# Patient Record
Sex: Male | Born: 1955 | Hispanic: Yes | Marital: Married | Smoking: Former smoker
Health system: Southern US, Community
[De-identification: ages and names within clinical notes are randomized; demographics above are authoritative.]

## PROBLEM LIST (undated history)

## (undated) DIAGNOSIS — K219 Gastro-esophageal reflux disease without esophagitis: Secondary | ICD-10-CM

## (undated) DIAGNOSIS — M1711 Unilateral primary osteoarthritis, right knee: Secondary | ICD-10-CM

## (undated) DIAGNOSIS — I5020 Unspecified systolic (congestive) heart failure: Secondary | ICD-10-CM

## (undated) DIAGNOSIS — I639 Cerebral infarction, unspecified: Secondary | ICD-10-CM

## (undated) DIAGNOSIS — I10 Essential (primary) hypertension: Secondary | ICD-10-CM

## (undated) DIAGNOSIS — I509 Heart failure, unspecified: Secondary | ICD-10-CM

## (undated) HISTORY — DX: Heart failure, unspecified: I50.9

## (undated) HISTORY — DX: Unspecified systolic (congestive) heart failure: I50.20

## (undated) HISTORY — PX: INNER EAR SURGERY: SHX679

---
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
## 2019-06-18 ENCOUNTER — Encounter: Payer: Self-pay | Admitting: Emergency Medicine

## 2019-06-18 ENCOUNTER — Emergency Department: Payer: Self-pay

## 2019-06-18 ENCOUNTER — Other Ambulatory Visit: Payer: Self-pay

## 2019-06-18 ENCOUNTER — Inpatient Hospital Stay
Admission: EM | Admit: 2019-06-18 | Discharge: 2019-06-23 | DRG: 064 | Disposition: A | Discharge status: Home / Self Care | Payer: Self-pay | Attending: Hospitalist | Admitting: Hospitalist

## 2019-06-18 ENCOUNTER — Observation Stay: Payer: Self-pay

## 2019-06-18 DIAGNOSIS — I110 Hypertensive heart disease with heart failure: Secondary | ICD-10-CM | POA: Diagnosis present

## 2019-06-18 DIAGNOSIS — M542 Cervicalgia: Secondary | ICD-10-CM | POA: Diagnosis present

## 2019-06-18 DIAGNOSIS — M170 Bilateral primary osteoarthritis of knee: Secondary | ICD-10-CM | POA: Diagnosis present

## 2019-06-18 DIAGNOSIS — Z9114 Patient's other noncompliance with medication regimen: Secondary | ICD-10-CM

## 2019-06-18 DIAGNOSIS — Z720 Tobacco use: Secondary | ICD-10-CM | POA: Diagnosis present

## 2019-06-18 DIAGNOSIS — I10 Essential (primary) hypertension: Secondary | ICD-10-CM | POA: Diagnosis present

## 2019-06-18 DIAGNOSIS — I639 Cerebral infarction, unspecified: Secondary | ICD-10-CM | POA: Diagnosis present

## 2019-06-18 DIAGNOSIS — I6381 Other cerebral infarction due to occlusion or stenosis of small artery: Principal | ICD-10-CM | POA: Diagnosis present

## 2019-06-18 DIAGNOSIS — R531 Weakness: Secondary | ICD-10-CM

## 2019-06-18 DIAGNOSIS — I739 Peripheral vascular disease, unspecified: Secondary | ICD-10-CM | POA: Diagnosis present

## 2019-06-18 DIAGNOSIS — I6509 Occlusion and stenosis of unspecified vertebral artery: Secondary | ICD-10-CM | POA: Diagnosis present

## 2019-06-18 DIAGNOSIS — I428 Other cardiomyopathies: Secondary | ICD-10-CM

## 2019-06-18 DIAGNOSIS — Z8249 Family history of ischemic heart disease and other diseases of the circulatory system: Secondary | ICD-10-CM

## 2019-06-18 DIAGNOSIS — F17290 Nicotine dependence, other tobacco product, uncomplicated: Secondary | ICD-10-CM | POA: Diagnosis present

## 2019-06-18 DIAGNOSIS — Z20822 Contact with and (suspected) exposure to covid-19: Secondary | ICD-10-CM | POA: Diagnosis present

## 2019-06-18 DIAGNOSIS — I5020 Unspecified systolic (congestive) heart failure: Secondary | ICD-10-CM

## 2019-06-18 DIAGNOSIS — M25511 Pain in right shoulder: Secondary | ICD-10-CM | POA: Diagnosis present

## 2019-06-18 DIAGNOSIS — G8194 Hemiplegia, unspecified affecting left nondominant side: Secondary | ICD-10-CM | POA: Diagnosis present

## 2019-06-18 DIAGNOSIS — R29707 NIHSS score 7: Secondary | ICD-10-CM | POA: Diagnosis present

## 2019-06-18 DIAGNOSIS — E876 Hypokalemia: Secondary | ICD-10-CM | POA: Diagnosis present

## 2019-06-18 DIAGNOSIS — R402362 Coma scale, best motor response, obeys commands, at arrival to emergency department: Secondary | ICD-10-CM | POA: Diagnosis present

## 2019-06-18 DIAGNOSIS — I5021 Acute systolic (congestive) heart failure: Secondary | ICD-10-CM | POA: Diagnosis present

## 2019-06-18 DIAGNOSIS — E785 Hyperlipidemia, unspecified: Secondary | ICD-10-CM | POA: Diagnosis present

## 2019-06-18 DIAGNOSIS — I160 Hypertensive urgency: Secondary | ICD-10-CM | POA: Diagnosis present

## 2019-06-18 HISTORY — DX: Essential (primary) hypertension: I10

## 2019-06-18 LAB — COMPREHENSIVE METABOLIC PANEL
ALT: 12 U/L (ref 0–44)
AST: 16 U/L (ref 15–41)
Albumin: 4.1 g/dL (ref 3.5–5.0)
Alkaline Phosphatase: 75 U/L (ref 38–126)
Anion gap: 10 (ref 5–15)
BUN: 19 mg/dL (ref 8–23)
CO2: 27 mmol/L (ref 22–32)
Calcium: 9.5 mg/dL (ref 8.9–10.3)
Chloride: 105 mmol/L (ref 98–111)
Creatinine, Ser: 1.05 mg/dL (ref 0.61–1.24)
GFR calc Af Amer: >60 mL/min (ref >60)
GFR calc non Af Amer: >60 mL/min (ref >60)
Glucose, Bld: 106 mg/dL — ABNORMAL HIGH (ref 70–99)
Potassium: 2.9 mmol/L — ABNORMAL LOW (ref 3.5–5.1)
Sodium: 142 mmol/L (ref 135–145)
Total Bilirubin: 0.9 mg/dL (ref 0.3–1.2)
Total Protein: 7.4 g/dL (ref 6.5–8.1)

## 2019-06-18 LAB — DIFFERENTIAL
Abs Immature Granulocytes: 0.02 10*3/uL (ref 0.00–0.07)
Basophils Absolute: 0.1 10*3/uL (ref 0.0–0.1)
Basophils Relative: 1 %
Eosinophils Absolute: 0.2 10*3/uL (ref 0.0–0.5)
Eosinophils Relative: 2 %
Immature Granulocytes: 0 %
Lymphocytes Relative: 18 %
Lymphs Abs: 1.2 10*3/uL (ref 0.7–4.0)
Monocytes Absolute: 0.3 10*3/uL (ref 0.1–1.0)
Monocytes Relative: 4 %
Neutro Abs: 5 10*3/uL (ref 1.7–7.7)
Neutrophils Relative %: 75 %

## 2019-06-18 LAB — CBC
HCT: 48.2 % (ref 39.0–52.0)
Hemoglobin: 15.7 g/dL (ref 13.0–17.0)
MCH: 28 pg (ref 26.0–34.0)
MCHC: 32.6 g/dL (ref 30.0–36.0)
MCV: 86.1 fL (ref 80.0–100.0)
Platelets: 209 10*3/uL (ref 150–400)
RBC: 5.6 MIL/uL (ref 4.22–5.81)
RDW: 13.3 % (ref 11.5–15.5)
WBC: 6.7 10*3/uL (ref 4.0–10.5)
nRBC: 0 % (ref 0.0–0.2)

## 2019-06-18 LAB — RESPIRATORY PANEL BY RT PCR (FLU A&B, COVID)
Influenza A by PCR: NEGATIVE
Influenza B by PCR: NEGATIVE
SARS Coronavirus 2 by RT PCR: NEGATIVE

## 2019-06-18 LAB — HIV ANTIBODY (ROUTINE TESTING W REFLEX): HIV Screen 4th Generation wRfx: NONREACTIVE

## 2019-06-18 LAB — PROTIME-INR
INR: 0.9 (ref 0.8–1.2)
Prothrombin Time: 12 seconds (ref 11.4–15.2)

## 2019-06-18 LAB — GLUCOSE, CAPILLARY
Glucose-Capillary: 99 mg/dL (ref 70–99)
Glucose-Capillary: 98 mg/dL (ref 70–99)

## 2019-06-18 LAB — APTT: aPTT: 30 seconds (ref 24–36)

## 2019-06-18 MED ORDER — ASPIRIN 300 MG RE SUPP
300.0000 mg | Freq: Every day | RECTAL | Status: DC
  Filled 2019-06-18 (×5): qty 1

## 2019-06-18 MED ORDER — POTASSIUM CHLORIDE 10 MEQ/100ML IV SOLN
10.0000 meq | INTRAVENOUS | Status: AC
  Administered 2019-06-18 (×2): 10 meq via INTRAVENOUS
  Filled 2019-06-18 (×2): qty 100

## 2019-06-18 MED ORDER — OXYCODONE-ACETAMINOPHEN 5-325 MG PO TABS
1.0000 | ORAL_TABLET | ORAL | Status: DC | PRN
  Administered 2019-06-18 – 2019-06-21 (×7): 1 via ORAL
  Filled 2019-06-18 (×7): qty 1

## 2019-06-18 MED ORDER — ACETAMINOPHEN 160 MG/5ML PO SOLN
650.0000 mg | ORAL | Status: DC | PRN
  Filled 2019-06-18: qty 20.3

## 2019-06-18 MED ORDER — POTASSIUM CHLORIDE CRYS ER 20 MEQ PO TBCR
40.0000 meq | EXTENDED_RELEASE_TABLET | ORAL | Status: AC
  Administered 2019-06-18 (×2): 40 meq via ORAL
  Filled 2019-06-18 (×2): qty 2

## 2019-06-18 MED ORDER — MORPHINE SULFATE (PF) 4 MG/ML IV SOLN
4.0000 mg | Freq: Once | INTRAVENOUS | Status: AC
  Administered 2019-06-18: 4 mg via INTRAVENOUS
  Filled 2019-06-18: qty 1

## 2019-06-18 MED ORDER — HYDRALAZINE HCL 20 MG/ML IJ SOLN
5.0000 mg | INTRAMUSCULAR | Status: DC | PRN
  Administered 2019-06-18: 5 mg via INTRAVENOUS
  Filled 2019-06-18 (×2): qty 0.25

## 2019-06-18 MED ORDER — ONDANSETRON HCL 4 MG/2ML IJ SOLN
4.0000 mg | Freq: Once | INTRAMUSCULAR | Status: AC
  Administered 2019-06-18: 4 mg via INTRAVENOUS
  Filled 2019-06-18: qty 2

## 2019-06-18 MED ORDER — ATORVASTATIN CALCIUM 20 MG PO TABS
40.0000 mg | ORAL_TABLET | Freq: Every day | ORAL | Status: DC
  Administered 2019-06-18: 40 mg via ORAL
  Filled 2019-06-18: qty 2

## 2019-06-18 MED ORDER — HYDRALAZINE HCL 20 MG/ML IJ SOLN: 5.0000 mg | INTRAMUSCULAR | Status: DC | PRN

## 2019-06-18 MED ORDER — ACETAMINOPHEN 650 MG RE SUPP: 650.0000 mg | RECTAL | Status: DC | PRN

## 2019-06-18 MED ORDER — SENNOSIDES-DOCUSATE SODIUM 8.6-50 MG PO TABS: 1.0000 | ORAL_TABLET | Freq: Every evening | ORAL | Status: DC | PRN

## 2019-06-18 MED ORDER — STROKE: EARLY STAGES OF RECOVERY BOOK: Freq: Once | Status: DC

## 2019-06-18 MED ORDER — SODIUM CHLORIDE 0.9% FLUSH: 3.0000 mL | Freq: Once | INTRAVENOUS | Status: DC

## 2019-06-18 MED ORDER — ACETAMINOPHEN 325 MG PO TABS: 650.0000 mg | ORAL_TABLET | ORAL | Status: DC | PRN

## 2019-06-18 MED ORDER — ASPIRIN EC 325 MG PO TBEC
325.0000 mg | DELAYED_RELEASE_TABLET | Freq: Every day | ORAL | Status: DC
  Administered 2019-06-18 – 2019-06-23 (×6): 325 mg via ORAL
  Filled 2019-06-18 (×6): qty 1

## 2019-06-18 MED ORDER — SODIUM CHLORIDE 0.9 % IV SOLN: INTRAVENOUS | Status: DC

## 2019-06-18 MED ORDER — NICOTINE 21 MG/24HR TD PT24
21.0000 mg | MEDICATED_PATCH | Freq: Every day | TRANSDERMAL | Status: DC
  Filled 2019-06-18 (×3): qty 1

## 2019-06-18 MED ORDER — ENOXAPARIN SODIUM 40 MG/0.4ML ~~LOC~~ SOLN
40.0000 mg | SUBCUTANEOUS | Status: DC
  Administered 2019-06-18 – 2019-06-22 (×5): 40 mg via SUBCUTANEOUS
  Filled 2019-06-18 (×6): qty 0.4

## 2019-06-18 NOTE — ED Triage Notes (Signed)
Per video interpretor, pt explains around 0200 or 0300 am this morning, left leg and left arm were weak when getting out of bed, causing pt to fall. When he went to bed last night, both arm and leg were fine-no weakness, pt has never had this before, speech clear per sister, no facial droop. Positive left arm drift, unable to hold it up at all. NAD.

## 2019-06-18 NOTE — Consult Note (Signed)
Reason for Consult: L sided weakness  Requesting Physician: Dr. Kerman Passey   CC: L sided weakness   HPI: Juan Hughes is an 64 y.o. male with hx of HNT non on any medications. Patient last known well around 10-11PM last night. Woke up this AM at 3 with L sided deficits. NIHSS of 3 for LUE, LLE drift and subjective numbness on L side. Speech at baseline.    Past Medical History:  Diagnosis Date  . Hypertension     History reviewed. No pertinent surgical history.  No family history on file.  Social History:  reports that he has been smoking cigars. He has never used smokeless tobacco. He reports that he does not drink alcohol. No history on file for drug.  No Known Allergies  Medications: I have reviewed the patient's current medications.  ROS: Unable to obtain due to language barrier   Physical Examination: Blood pressure (!) 205/117, pulse 69, temperature 98 F (36.7 C), temperature source Oral, resp. rate 15, height 5' (1.524 m), weight 68 kg, SpO2 99 %.   Mental Status: Alert, oriented, thought content appropriate.  Speech fluent without evidence of aphasia.  Able to follow 3 step commands without difficulty. Cranial Nerves: II: Discs flat bilaterally; Visual fields grossly normal, pupils equal, round, reactive to light and accommodation III,IV, VI: ptosis not present, extra-ocular motions intact bilaterally V,VII: smile symmetric, facial light touch sensation normal bilaterally VIII: hearing normal bilaterally XI: bilateral shoulder shrug XII: midline tongue extension Motor: Right : Upper extremity   5/5    Left:     Upper extremity   4-/5  Lower extremity   5/5     Lower extremity   4-/5 Tone and bulk:normal tone throughout; no atrophy noted Sensory: Pinprick and light touch subjective decrease L side Deep Tendon Reflexes: 1+ and symmetric throughout Plantars: Right: downgoing   Left: downgoing Cerebellar: normal finger-to-nose, Gait: not tested        Laboratory Studies:   Basic Metabolic Panel: Recent Labs  Lab 06/18/19 1129  NA 142  K 2.9*  CL 105  CO2 27  GLUCOSE 106*  BUN 19  CREATININE 1.05  CALCIUM 9.5    Liver Function Tests: Recent Labs  Lab 06/18/19 1129  AST 16  ALT 12  ALKPHOS 75  BILITOT 0.9  PROT 7.4  ALBUMIN 4.1   No results for input(s): LIPASE, AMYLASE in the last 168 hours. No results for input(s): AMMONIA in the last 168 hours.  CBC: Recent Labs  Lab 06/18/19 1129  WBC 6.7  NEUTROABS 5.0  HGB 15.7  HCT 48.2  MCV 86.1  PLT 209    Cardiac Enzymes: No results for input(s): CKTOTAL, CKMB, CKMBINDEX, TROPONINI in the last 168 hours.  BNP: Invalid input(s): POCBNP  CBG: Recent Labs  Lab 06/18/19 1129 06/18/19 1202  GLUCAP 99 98    Microbiology: No results found for this or any previous visit.  Coagulation Studies: Recent Labs    06/18/19 1129  LABPROT 12.0  INR 0.9    Urinalysis: No results for input(s): COLORURINE, LABSPEC, PHURINE, GLUCOSEU, HGBUR, BILIRUBINUR, KETONESUR, PROTEINUR, UROBILINOGEN, NITRITE, LEUKOCYTESUR in the last 168 hours.  Invalid input(s): APPERANCEUR  Lipid Panel:  No results found for: CHOL, TRIG, HDL, CHOLHDL, VLDL, LDLCALC  HgbA1C: No results found for: HGBA1C  Urine Drug Screen:  No results found for: LABOPIA, COCAINSCRNUR, LABBENZ, AMPHETMU, THCU, LABBARB  Alcohol Level: No results for input(s): ETH in the last 168 hours.  Other results: EKG: normal EKG,  normal sinus rhythm, unchanged from previous tracings.  Imaging: CT HEAD WO CONTRAST  Result Date: 06/18/2019 CLINICAL DATA:  Left leg and arm weakness.  Subsequent fall EXAM: CT HEAD WITHOUT CONTRAST TECHNIQUE: Contiguous axial images were obtained from the base of the skull through the vertex without intravenous contrast. COMPARISON:  None. FINDINGS: Brain: Chronic small vessel ischemic gliosis in the cerebral white matter, fairly extensive. No evidence of acute infarct,  hemorrhage, hydrocephalus, or mass. Vascular: Vertebrobasilar tortuosity.  No hyperdense vessel. Skull: Negative Sinuses/Orbits: Negative IMPRESSION: 1. No acute finding. 2. Extensive chronic small vessel ischemia. Electronically Signed   By: Marnee Spring M.D.   On: 06/18/2019 11:44     Assessment/Plan:   64 y.o. male with hx of HNT non on any medications. Patient last known well around 10-11PM last night. Woke up this AM at 3 with L sided deficits. NIHSS of 3 for LUE, LLE drift and subjective numbness on L side. Speech at baseline.    - Likely stroke in setting of small vessel disease as SBP >200 on admission - Permissive HTN overnight close to 180-200 systolic if possiblke - ASA 325 now - no TPA as outside window - no stat CTA as low NIHSS as HTN so suspect stroke due to small vessel disase - admission  - MRI/MRA - pt/ot  - speech -2decho 06/18/2019, 12:35 PM

## 2019-06-18 NOTE — H&P (Signed)
History and Physical    Osborne Serio VVZ:[id number] DOB: Oct 10, 1955 DOA: 06/18/2019  Referring MD/NP/PA:   PCP: Patient, No Pcp Per   Patient coming from:  The patient is coming from home.  At baseline, pt is independent for most of ADL.        Chief Complaint: Left-sided weakness  HPI: Cloy Cozzens is a 64 y.o. male with medical history significant of hypertension, tobacco abuse, who presented with left-sided weakness.  For his son who is living with patient (I called his son by phone), patient was last known normal at last night, and developed left-sided weakness in left arm and leg at about 2-3 AM. Pt does not have slurred speech, facial droop.  No chest pain, shortness breath, cough, fever or chills.  No nausea, vomiting, diarrhea, abdominal pain, symptoms of UTI.  Patient did not take blood pressure medications for long time per his son.  Pt also has posterior neck pain, no injury or fall.  ED Course: pt was found to have WBC 6.7, INR 0.9, PTT 30, Pending RVP Covid test, potassium 2.9, creatinine 1.05, BUN 19, temperature normal, blood pressure 205/117, heart rate 69, RR 15 oxygen saturation 99% on room air.  CT of head is negative for acute intracranial abnormalities the patient is placed on MedSurg bed for observation.  Neurology, Dr. Loretha Brasil was consulted.  Review of Systems:   General: no fevers, chills, no body weight gain, has fatigue HEENT: no blurry vision, hearing changes or sore throat Respiratory: no dyspnea, coughing, wheezing CV: no chest pain, no palpitations GI: no nausea, vomiting, abdominal pain, diarrhea, constipation GU: no dysuria, burning on urination, increased urinary frequency, hematuria  Ext: no leg edema Neuro: has left sided weakness, no vision change or hearing loss Skin: no rash, no skin tear. MSK: No muscle spasm, no deformity, no limitation of range of movement in spin Heme: No easy bruising.  Travel history: No recent long  distant travel.  Allergy: No Known Allergies  Past Medical History:  Diagnosis Date  . Hypertension     History reviewed. No pertinent surgical history.  Social History:  reports that he has been smoking cigars. He has never used smokeless tobacco. He reports that he does not drink alcohol or use drugs.  Family History:  Family History  Problem Relation Age of Onset  . Hypertension Mother      Prior to Admission medications   Not on File    Physical Exam: Vitals:   06/18/19 1230 06/18/19 1300 06/18/19 1330 06/18/19 1400  BP: (!) 206/120 (!) 196/115 (!) 202/109 (!) 197/104  Pulse: 70 66 63 65  Resp: 18 14 11 15   Temp:      TempSrc:      SpO2: 98% 97% 98% 98%  Weight:      Height:       General: Not in acute distress HEENT:       Eyes: PERRL, EOMI, no scleral icterus.       ENT: No discharge from the ears and nose, no pharynx injection, no tonsillar enlargement.        Neck: No JVD, no bruit, no mass felt. Heme: No neck lymph node enlargement. Cardiac: S1/S2, RRR, No murmurs, No gallops or rubs. Respiratory:  No rales, wheezing, rhonchi or rubs. GI: Soft, nondistended, nontender, no rebound pain, no organomegaly, BS present. GU: No hematuria Ext: No pitting leg edema bilaterally. 2+DP/PT pulse bilaterally. Musculoskeletal: No joint deformities, No joint redness or warmth, no limitation  of ROM in spin. Skin: No rashes.  Neuro: Alert, oriented X3, cranial nerves II-XII grossly intact, moves all extremities normally. Muscle strength 4/5 in left arm and 3/5 in left leg, 5/5 in right extremities, sensation to light touch intact. Psych: Patient is not psychotic, no suicidal or hemocidal ideation.  Labs on Admission: I have personally reviewed following labs and imaging studies  CBC: Recent Labs  Lab 06/18/19 1129  WBC 6.7  NEUTROABS 5.0  HGB 15.7  HCT 48.2  MCV 86.1  PLT 209   Basic Metabolic Panel: Recent Labs  Lab 06/18/19 1129  NA 142  K 2.9*  CL 105    CO2 27  GLUCOSE 106*  BUN 19  CREATININE 1.05  CALCIUM 9.5   GFR: Estimated Creatinine Clearance: 59.8 mL/min (by C-G formula based on SCr of 1.05 mg/dL). Liver Function Tests: Recent Labs  Lab 06/18/19 1129  AST 16  ALT 12  ALKPHOS 75  BILITOT 0.9  PROT 7.4  ALBUMIN 4.1   No results for input(s): LIPASE, AMYLASE in the last 168 hours. No results for input(s): AMMONIA in the last 168 hours. Coagulation Profile: Recent Labs  Lab 06/18/19 1129  INR 0.9   Cardiac Enzymes: No results for input(s): CKTOTAL, CKMB, CKMBINDEX, TROPONINI in the last 168 hours. BNP (last 3 results) No results for input(s): PROBNP in the last 8760 hours. HbA1C: No results for input(s): HGBA1C in the last 72 hours. CBG: Recent Labs  Lab 06/18/19 1129 06/18/19 1202  GLUCAP 99 98   Lipid Profile: No results for input(s): CHOL, HDL, LDLCALC, TRIG, CHOLHDL, LDLDIRECT in the last 72 hours. Thyroid Function Tests: No results for input(s): TSH, T4TOTAL, FREET4, T3FREE, THYROIDAB in the last 72 hours. Anemia Panel: No results for input(s): VITAMINB12, FOLATE, FERRITIN, TIBC, IRON, RETICCTPCT in the last 72 hours. Urine analysis: No results found for: COLORURINE, APPEARANCEUR, LABSPEC, PHURINE, GLUCOSEU, HGBUR, BILIRUBINUR, KETONESUR, PROTEINUR, UROBILINOGEN, NITRITE, LEUKOCYTESUR Sepsis Labs: @LABRCNTIP (procalcitonin:4,lacticidven:4) ) Recent Results (from the past 240 hour(s))  Respiratory Panel by RT PCR (Flu A&B, Covid) - Nasopharyngeal Swab     Status: None   Collection Time: 06/18/19 12:18 PM   Specimen: Nasopharyngeal Swab  Result Value Ref Range Status   SARS Coronavirus 2 by RT PCR NEGATIVE NEGATIVE Final    Comment: (NOTE) SARS-CoV-2 target nucleic acids are NOT DETECTED. The SARS-CoV-2 RNA is generally detectable in upper respiratoy specimens during the acute phase of infection. The lowest concentration of SARS-CoV-2 viral copies this assay can detect is 131 copies/mL. A negative  result does not preclude SARS-Cov-2 infection and should not be used as the sole basis for treatment or other patient management decisions. A negative result may occur with  improper specimen collection/handling, submission of specimen other than nasopharyngeal swab, presence of viral mutation(s) within the areas targeted by this assay, and inadequate number of viral copies (<131 copies/mL). A negative result must be combined with clinical observations, patient history, and epidemiological information. The expected result is Negative. Fact Sheet for Patients:  06/20/19 Fact Sheet for Healthcare Providers:  https://www.moore.com/ This test is not yet ap proved or cleared by the https://www.young.biz/ FDA and  has been authorized for detection and/or diagnosis of SARS-CoV-2 by FDA under an Emergency Use Authorization (EUA). This EUA will remain  in effect (meaning this test can be used) for the duration of the COVID-19 declaration under Section 564(b)(1) of the Act, 21 U.S.C. section 360bbb-3(b)(1), unless the authorization is terminated or revoked sooner.    Influenza A  by PCR NEGATIVE NEGATIVE Final   Influenza B by PCR NEGATIVE NEGATIVE Final    Comment: (NOTE) The Xpert Xpress SARS-CoV-2/FLU/RSV assay is intended as an aid in  the diagnosis of influenza from Nasopharyngeal swab specimens and  should not be used as a sole basis for treatment. Nasal washings and  aspirates are unacceptable for Xpert Xpress SARS-CoV-2/FLU/RSV  testing. Fact Sheet for Patients: PinkCheek.be Fact Sheet for Healthcare Providers: GravelBags.it This test is not yet approved or cleared by the [place] FDA and  has been authorized for detection and/or diagnosis of SARS-CoV-2 by  FDA under an Emergency Use Authorization (EUA). This EUA will remain  in effect (meaning this test can be used) for the  duration of the  Covid-19 declaration under Section 564(b)(1) of the Act, 21  U.S.C. section 360bbb-3(b)(1), unless the authorization is  terminated or revoked. Performed at [facility], [address] [place], [place] [address]      Radiological Exams on Admission: CT HEAD WO CONTRAST  Result Date: 06/18/2019 CLINICAL DATA:  Left leg and arm weakness.  Subsequent fall EXAM: CT HEAD WITHOUT CONTRAST TECHNIQUE: Contiguous axial images were obtained from the base of the skull through the vertex without intravenous contrast. COMPARISON:  None. FINDINGS: Brain: Chronic small vessel ischemic gliosis in the cerebral white matter, fairly extensive. No evidence of acute infarct, hemorrhage, hydrocephalus, or mass. Vascular: Vertebrobasilar tortuosity.  No hyperdense vessel. Skull: Negative Sinuses/Orbits: Negative IMPRESSION: 1. No acute finding. 2. Extensive chronic small vessel ischemia. Electronically Signed   By: Monte Fantasia M.D.   On: 06/18/2019 11:44     EKG: Independently reviewed.  Sinus rhythm, QTC 529, LAE, LVH.  Assessment/Plan Principal Problem:   Left-sided weakness Active Problems:   Hypertension   Hypokalemia   Tobacco abuse   Stroke ([place])   Left-sided weakness due to possible stroke: CT-head negative for acute intracranial abnormalities.  Neurology, Dr. Irish Elders was consulted, who recommended stroke work-up.  - will place on tele bed for obs - Highly appreciated neurologist's consultation,will follow up recommendations - Obtain MRI/MRA  - will hold oral Bp meds to allow permissive HTN in the setting of acute stroke  - Check carotid dopplers  - start ASA and lipitor - fasting lipid panel and HbA1c  - 2D transthoracic echocardiography  - swallowing screen. If fails, will get SLP - PT/OT consult  Hypertension: -start IV hydralazine as needed for SBP>200 or dBP>180, allow permissive hypertension  Hypokalemia: K= 2.9 on admission. - Repleted - Check  Mg level  Tobacco abuse: -Did counseling about importance of quitting smoking -Nicotine patch    DVT ppx: SQ Lovenox Code Status: Full code Family Communication: Yes, patient's sister at bed side and son by phone Disposition Plan:  Anticipate discharge back to previous home environment Consults called:  Dr. Irish Elders of neuro Admission status: Med-surg bed for obs   Date of Service 06/18/2019    [place] [facility]   If 7PM-7AM, please contact night-coverage [email or web address] Password [email or web address] 06/18/2019, 2:53 PM

## 2019-06-18 NOTE — Progress Notes (Signed)
PT Cancellation Note  Patient Details Name: Juan Hughes MRN: [id number] DOB: Jan 03, 1956   Cancelled Treatment:    Reason Eval/Treat Not Completed: Medical issues which prohibited therapy(Chart reviewed, RN consulted. Aware of permissive HTN, however I do not see an emergent need to perform OOB exertion at this time c BP in 190s/100s. MRI pending. Will await updates lytes to assure K+ in safe range. Will continue to follow.)  3:18 PM, 06/18/19 Rosamaria Lints, PT, DPT Physical Therapist - [facility]  [phone] (ASCOM)   [place] 06/18/2019, 3:18 PM

## 2019-06-18 NOTE — ED Provider Notes (Signed)
[facility] Emergency Department Provider Note  Time seen: 11:53 AM  I have reviewed the triage vital signs and the nursing notes.   HISTORY  Chief Complaint Stroke symptoms   HPI Juan Hughes is a 64 y.o. male with a past medical history of hypertension currently not on prescription medications presents to the emergency department for left-sided weakness.  According to the patient he went to bed feeling normal last night with full mobility.  Patient states around 3:00 this morning he awoke to use the restroom and could not adequately move his left arm or leg and fell to the ground.  States he went back to bed but when he awoke later this morning he continued to have symptoms so he came to the emergency department for evaluation.  Patient denies any trouble thinking or speaking.  Denies any sensory deficits.  No history of CVA previously.  Past Medical History:  Diagnosis Date  . Hypertension     There are no problems to display for this patient.   History reviewed. No pertinent surgical history.  Prior to Admission medications   Not on File    No Known Allergies  No family history on file.  Social History Social History   Tobacco Use  . Smoking status: Current Every Day Smoker    Types: Cigars  . Smokeless tobacco: Never Used  Substance Use Topics  . Alcohol use: Never  . Drug use: Not on file    Review of Systems Constitutional: Negative for fever. Cardiovascular: Negative for chest pain. Respiratory: Negative for shortness of breath. Gastrointestinal: Negative for abdominal pain, vomiting  Musculoskeletal: Negative for musculoskeletal complaints Neurological: Negative for headache All other ROS negative  ____________________________________________   PHYSICAL EXAM:  VITAL SIGNS: ED Triage Vitals  Enc Vitals Group     BP 06/18/19 1110 (!) 192/102     Pulse Rate 06/18/19 1112 76     Resp 06/18/19 1112 18     Temp  06/18/19 1112 98 F (36.7 C)     Temp Source 06/18/19 1112 Oral     SpO2 06/18/19 1112 97 %     Weight 06/18/19 1111 150 lb (68 kg)     Height 06/18/19 1111 5' (1.524 m)     Head Circumference --      Peak Flow --      Pain Score --      Pain Loc --      Pain Edu? --      Excl. in [place]? --     Constitutional: Alert and oriented. Well appearing and in no distress. Eyes: Normal exam ENT      Head: Normocephalic and atraumatic.      Mouth/Throat: Mucous membranes are moist. Cardiovascular: Normal rate, regular rhythm Respiratory: Normal respiratory effort without tachypnea nor retractions. Breath sounds are clear Gastrointestinal: Soft and nontender. No distention.  Musculoskeletal: Nontender with normal range of motion in all extremities. Neurologic:  Normal speech and language.  Significant left upper extremity and left lower extremity weakness.  Sensation intact and equal.  No cranial nerve deficits. Skin:  Skin is warm, dry and intact.  Psychiatric: Mood and affect are normal.   ____________________________________________    EKG  EKG viewed and interpreted by myself shows a normal sinus rhythm at 70 bpm with a narrow QRS, normal axis, largely normal intervals besides slight QTC prolongation, nonspecific ST changes.  ____________________________________________    RADIOLOGY  CT head negative  ____________________________________________   INITIAL IMPRESSION /  ASSESSMENT AND PLAN / ED COURSE  Pertinent labs & imaging results that were available during my care of the patient were reviewed by me and considered in my medical decision making (see chart for details).   Patient presents to the emergency department for left-sided weakness.  Patient's exam is consistent with likely CVA.  CT is negative.  We will consult neurology, obtain MRI and admit to the hospitalist service for further work-up and treatment.  Patient agreeable to plan of care.  Outside of TPA window.  Seen  by neurology no concern for LVO per neurology.  We will admit to medicine for workup.    NIH Stroke Scale   Interval: Baseline Time: 12:00 PM Person Administering Scale: Minna Antis  Administer stroke scale items in the order listed. Record performance in each category after each subscale exam. Do not go back and change scores. Follow directions provided for each exam technique. Scores should reflect what the patient does, not what the clinician thinks the patient can do. The clinician should record answers while administering the exam and work quickly. Except where indicated, the patient should not be coached (i.e., repeated requests to patient to make a special effort).   1a  Level of consciousness: 0=alert; keenly responsive  1b. LOC questions:  0=Performs both tasks correctly  1c. LOC commands: 0=Performs both tasks correctly  2.  Best Gaze: 0=normal  3.  Visual: 0=No visual loss  4. Facial Palsy: 0=Normal symmetric movement  5a.  Motor left arm: 2=Some effort against gravity, limb cannot get to or maintain (if cured) 90 (or 45) degrees, drifts down to bed, but has some effort against gravity  5b.  Motor right arm: 0=No drift, limb holds 90 (or 45) degrees for full 10 seconds  6a. motor left leg: 2=Some effort against gravity, limb cannot get to or maintain (if cured) 90 (or 45) degrees, drifts down to bed, but has some effort against gravity  6b  Motor right leg:  0=No drift, limb holds 90 (or 45) degrees for full 10 seconds  7. Limb Ataxia: 2=Present in two limbs  8.  Sensory: 0=Normal; no sensory loss  9. Best Language:  0=No aphasia, normal  10. Dysarthria: 0=Normal  11. Extinction and Inattention: 0=No abnormality  12. Distal motor function: 1=At least some extension after 5 seconds, but is not fully extended. Any movement of the fingers which is not in response to a command is not scored   Total:   7    [name] was evaluated in Emergency Department on  06/18/2019 for the symptoms described in the history of present illness. He was evaluated in the context of the global COVID-19 pandemic, which necessitated consideration that the patient might be at risk for infection with the SARS-CoV-2 virus that causes COVID-19. Institutional protocols and algorithms that pertain to the evaluation of patients at risk for COVID-19 are in a state of rapid change based on information released by regulatory bodies including the CDC and federal and state organizations. These policies and algorithms were followed during the patient's care in the ED.  ____________________________________________   FINAL CLINICAL IMPRESSION(S) / ED DIAGNOSES  CVA   Minna Antis, MD 06/18/19 1222

## 2019-06-18 NOTE — ED Notes (Signed)
Pt c/o neck pain 8/10. Dr. Lenard Lance notified. Morphine ordered

## 2019-06-18 NOTE — ED Notes (Signed)
Interpreter at bedside to discuss plan of care

## 2019-06-18 NOTE — ED Notes (Signed)
Interpreter at bedside to update family on plan of care

## 2019-06-18 NOTE — Progress Notes (Signed)
OT Cancellation Note  Patient Details Name: Juan Hughes MRN: [id number] DOB: December 21, 1955   Cancelled Treatment:    Reason Eval/Treat Not Completed: Medical issues which prohibited therapy  OT consult received and chart reviewed. Upon chart review, pt noted to have low K+ at 2.9, BP elevated (SBP 191-206), and MRI still pending. Will f/u for OT evaluation next available date as pt becomes appropriate. Thank you.  Rejeana Brock, MS, OTR/L ascom [phone] 06/18/19, 3:57 PM

## 2019-06-18 NOTE — ED Notes (Signed)
ED Provider at bedside. 

## 2019-06-19 ENCOUNTER — Observation Stay
Admit: 2019-06-19 | Discharge: 2019-06-19 | Disposition: A | Discharge status: Home / Self Care | Payer: Self-pay | Attending: Internal Medicine | Admitting: Internal Medicine

## 2019-06-19 LAB — ECHOCARDIOGRAM COMPLETE
Height: 60 in
Weight: 2400 oz

## 2019-06-19 LAB — LIPID PANEL
Cholesterol: 210 mg/dL — ABNORMAL HIGH (ref 0–200)
HDL: 51 mg/dL (ref >40)
LDL Cholesterol: 136 mg/dL — ABNORMAL HIGH (ref 0–99)
Total CHOL/HDL Ratio: 4.1 RATIO
Triglycerides: 113 mg/dL (ref <150)
VLDL: 23 mg/dL (ref 0–40)

## 2019-06-19 LAB — MAGNESIUM: Magnesium: 1.8 mg/dL (ref 1.7–2.4)

## 2019-06-19 LAB — HEMOGLOBIN A1C
Hgb A1c MFr Bld: 5.4 % (ref 4.8–5.6)
Mean Plasma Glucose: 108.28 mg/dL

## 2019-06-19 MED ORDER — ADULT MULTIVITAMIN W/MINERALS CH
1.0000 | ORAL_TABLET | Freq: Every day | ORAL | Status: DC
  Administered 2019-06-20 – 2019-06-23 (×4): 1 via ORAL
  Filled 2019-06-19 (×4): qty 1

## 2019-06-19 MED ORDER — AMLODIPINE BESYLATE 5 MG PO TABS
5.0000 mg | ORAL_TABLET | Freq: Every day | ORAL | Status: DC
  Administered 2019-06-19: 5 mg via ORAL
  Filled 2019-06-19: qty 1

## 2019-06-19 MED ORDER — ATORVASTATIN CALCIUM 20 MG PO TABS
80.0000 mg | ORAL_TABLET | Freq: Every day | ORAL | Status: DC
  Administered 2019-06-19 – 2019-06-22 (×4): 80 mg via ORAL
  Filled 2019-06-19 (×4): qty 4

## 2019-06-19 MED ORDER — LABETALOL HCL 5 MG/ML IV SOLN
10.0000 mg | Freq: Four times a day (QID) | INTRAVENOUS | Status: DC | PRN
  Administered 2019-06-19: 10 mg via INTRAVENOUS
  Filled 2019-06-19: qty 4

## 2019-06-19 MED ORDER — ENSURE ENLIVE PO LIQD
237.0000 mL | Freq: Two times a day (BID) | ORAL | Status: DC
  Administered 2019-06-19 – 2019-06-23 (×4): 237 mL via ORAL

## 2019-06-19 MED ORDER — LOSARTAN POTASSIUM 25 MG PO TABS
25.0000 mg | ORAL_TABLET | Freq: Every day | ORAL | Status: DC
  Administered 2019-06-19: 25 mg via ORAL
  Filled 2019-06-19: qty 1

## 2019-06-19 NOTE — TOC Progression Note (Signed)
Transition of Care [facility]) - Progression Note    Patient Details  Name: Juan Hughes MRN: [id number] Date of Birth: 1955-06-15  Transition of Care [facility]) CM/SW Contact  Barrie Dunker, RN Phone Number: 06/19/2019, 2:21 PM  Clinical Narrative:     Sherron Monday with Kaitlyn from CIR and they will review the patient for possible admission once they get the PT and OT notes.  I am waiting to speak to the family to see what is available to them With the patient not having Insurance finding a SNF bed would be very difficult and would require an LOG as well as the patient applying for Medicaid and then a facility willing to accept the LOG.  Awaiting to hear back from CIR as this will be the recommendation from PT and OT, and then will speak with the family with what is available.       Expected Discharge Plan and Services                                                 Social Determinants of Health (SDOH) Interventions    Readmission Risk Interventions No flowsheet data found.

## 2019-06-19 NOTE — Progress Notes (Signed)
Initial Nutrition Assessment  DOCUMENTATION CODES:   Not applicable  INTERVENTION:  Ensure Enlive po BID, each supplement provides 350 kcal and 20 grams of protein (chocolate)  MVI with minerals daily  NUTRITION DIAGNOSIS:   Inadequate oral intake related to acute illness, decreased appetite(R CR stroke) as evidenced by per patient/family report(energy intake <75% 2-3 days PTA, meal completion <50%).  GOAL:   Patient will meet greater than or equal to 90% of their needs    MONITOR:   PO intake, Supplement acceptance, Labs, I & O's  REASON FOR ASSESSMENT:   Malnutrition Screening Tool    ASSESSMENT:  RD working remotely.  64 year old male with past medical history of HTN and tobacco abuse who presented with left-sided weakness admitted with suspected stroke due to small vessel disease.  Per chart review, patient is not on medications for HTN. He woke up the morning of 1/28 around 3 AM with L sided deficits. CT of head negative for acute intracranial abnormalities and patient admitted for observation and neurology consulted for evaluation.   Per neuro note, likely stroke of small vessel as SBP >200 on admission.  MRI brain with R CR stroke and small vessel disease.   Patient on heart healthy diet, no recorded meals at this time. Spoke with pt via phone with the assistance of Byrd Hesselbach from [employer] (ID [phone]). Patient reports his appetite has not been so good over the past couple of days. He recalls eating little of cereal with milk, toast, and coffee this morning for breakfast and some of his fish with broccoli, and tea for lunch.  He endorses good home po, reports 2 meals/day, usually just lunch and dinner and recalls chx with vegetables, chicken mole with rice, and lots of fish. Patient drinks coffee in the morning, does not eat breakfast on a regular basis. Patient amenable to cold chocolate flavored supplements during admission. RD will provide Ensure twice daily  to aid with calorie and protein needs and encouraged po intake of meals.   Patient denies any unintentional weight loss and recall UBW 150-155 lbs. Current wt 149.6 lbs No wt history for review  Medications reviewed. Labs: K 2.9 (L)  NUTRITION - FOCUSED PHYSICAL EXAM: Unable to complete at this time, RD working remotely.   Diet Order:   Diet Order            Diet Heart Room service appropriate? Yes; Fluid consistency: Thin  Diet effective now              EDUCATION NEEDS:   No education needs have been identified at this time  Skin:  Skin Assessment: Reviewed RN Assessment  Last BM:  1/27  Height:   Ht Readings from Last 1 Encounters:  06/18/19 5' (1.524 m)    Weight:   Wt Readings from Last 1 Encounters:  06/18/19 68 kg    Ideal Body Weight:  48.2 kg  BMI:  Body mass index is 29.29 kg/m.  Estimated Nutritional Needs:   Kcal:  1700-1900  Protein:  85-95  Fluid:  </= 1.7 L/day   Lars Masson, RD, LDN Clinical Nutrition [employer] Telephone [phone] After Hours/Weekend Pager: [phone]

## 2019-06-19 NOTE — TOC Progression Note (Signed)
Transition of Care [facility]) - Progression Note    Patient Details  Name: Juan Hughes MRN: [id number] Date of Birth: 1956-05-02  Transition of Care [facility]) CM/SW Contact  Barrie Dunker, RN Phone Number: 06/19/2019, 4:13 PM  Clinical Narrative:     CIR will evaluate on Monday and states that he looks like a good candidate       Expected Discharge Plan and Services                                                 Social Determinants of Health (SDOH) Interventions    Readmission Risk Interventions No flowsheet data found.

## 2019-06-19 NOTE — Plan of Care (Signed)
Pt is still weak on the left side. Once during shift pt had some sensory deficits on his left side. No other signs of distress noted. Will continue to monitor.

## 2019-06-19 NOTE — Evaluation (Signed)
Occupational Therapy Evaluation Patient Details Name: Juan Hughes MRN: [id number] DOB: 11/21/1955 Today's Date: 06/19/2019    History of Present Illness Pt is 64 yo male PMH of HTN presented to ED 1/28 for L sided weakness. MRI+ for small acute lacunar infarct in the posterior right coronaradiata. No associated hemorrhage or mass effect. 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 Spanish interpreter Claretha Cooper) utilized. Prior to hospital admission, pt was mod independent ambulating with SPC, indep with ADL and IADL, and working in painting/construction. Pt lives with his spouse and adult son in a one-level home with 5-7 steps to enter and bilateral hand rails. Currently pt demonstrates significant impairments in LUE/LLE strength and coordination which are compromising pt's balance and ability to perform mobility and ADL at baseline independence. Pt requires min-mod assist x2 for transfer training with decreased WBing through LLE on floor and LUE through RW. Significant R knee pain also impeding his ability to maintain standing for more than a few minutes at a time. Mod A x2 and 2 person handheld assist to ambulate short shuffling steps from EOB to recliner. Pt instructed in LUE ex and encouraged maximal LUE involvement in bilat and ADL tasks to support return of function. Pt also educated on safe positioning of LUE in order to promote functional return, improve safety, and promote skin integrity on this date. Pt verbalized understanding. Pt motivated to return to prior independence and return to work. Good family support at home. Sister present for session this date. No cognitive, sensory, or visual deficits noted with assessment on this date. Pt would benefit from skilled OT to address noted impairments and functional limitations (see  below for any additional details) in order to maximize safety and independence while minimizing falls risk and caregiver burden.  Upon hospital discharge, recommend pt discharge to CIR for high-intensity skilled acute rehab services to maximize safety and return to PLOF.    Follow Up Recommendations  CIR    Equipment Recommendations  3 in 1 bedside commode    Recommendations for Other Services Rehab consult     Precautions / Restrictions Precautions Precautions: Fall Restrictions Weight Bearing Restrictions: No      Mobility Bed Mobility               General bed mobility comments: deferred, pt seated EOB upon entry  Transfers Overall transfer level: Needs assistance Equipment used: Rolling walker (2 wheeled);2 person hand held assist Transfers: Sit to/from Stand Sit to Stand: Mod assist;Min assist;+2 physical assistance         General transfer comment: Mod A x2 for STS trial with RW (difficulty maintaining LUE grip on RW), Min A x2 for standing balance    Balance Overall balance assessment: Needs assistance Sitting-balance support: Single extremity supported;Feet supported Sitting balance-Leahy Scale: Fair       Standing balance-Leahy Scale: Poor Standing balance comment: required BUE support                           ADL either performed or assessed with clinical judgement   ADL Overall ADL's : Needs assistance/impaired Eating/Feeding: Modified independent Eating/Feeding Details (indicate cue type and reason): increased effort/difficulty with bilat tasks requiring LUE involvement Grooming: Sitting;Set up   Upper Body Bathing: Sitting;Minimal assistance   Lower Body Bathing: Sitting/lateral leans;Moderate assistance;Minimal  assistance   Upper Body Dressing : Sitting;Minimal assistance   Lower Body Dressing: Sitting/lateral leans;Moderate assistance;Minimal assistance   Toilet Transfer: Ambulation;+2 for physical assistance;Moderate  assistance;BSC           Functional mobility during ADLs: Moderate assistance;+2 for physical assistance       Vision Baseline Vision/History: Wears glasses Wears Glasses: At all times(bifocals) Patient Visual Report: No change from baseline Vision Assessment?: No apparent visual deficits     Perception     Praxis      Pertinent Vitals/Pain Pain Assessment: 0-10 Pain Score: 7  Pain Location: R knee (chronic) Pain Descriptors / Indicators: Aching;Grimacing;Guarding Pain Intervention(s): Limited activity within patient's tolerance;Monitored during session;Repositioned;Patient requesting pain meds-RN notified     Hand Dominance Right   Extremity/Trunk Assessment Upper Extremity Assessment Upper Extremity Assessment: LUE deficits/detail(RUE WFL) LUE Deficits / Details: grossly 3-/5 shoulder flexion, elbow flex/ext 4-/5, grip 4/5, decreased finger to nose, thumb opposition testing; pt denies sensory deficits LUE Coordination: decreased fine motor;decreased gross motor   Lower Extremity Assessment Lower Extremity Assessment: LLE deficits/detail;Defer to PT evaluation(RLE grossly WFL, limited by R knee pain) LLE Deficits / Details: grossly at least 4/5 except DF/PF weaker, denies sensory deficits, impaired heel to shin LLE Coordination: decreased fine motor   Cervical / Trunk Assessment Cervical / Trunk Assessment: Normal   Communication Communication Communication: Prefers language other than English;Interpreter utilized(Spanish)   Cognition Arousal/Alertness: Awake/alert Behavior During Therapy: WFL for tasks assessed/performed Overall Cognitive Status: Within Functional Limits for tasks assessed                                     General Comments       Exercises Other Exercises Other Exercises: Pt instructed in functional transfer training with OT/PT, during attempts with RW, therapist facilitated mod-max manual cues to LUE to maintain grip on RW  and support proprioceptive input through elbow and wrist joints Other Exercises: Pt/sister instructed in LUE [facility] and maximizing functional use of LUE during and outside therapy sessions to support maximal optimal return   Shoulder Instructions      Home Living Family/patient expects to be discharged to:: Private residence Living Arrangements: Spouse/significant other;Children(son) Available Help at Discharge: Family;Available 24 hours/day Type of Home: House Home Access: Stairs to enter [employer] of Steps: 5-7 Entrance Stairs-Rails: Can reach both;Right;Left Home Layout: One level     Bathroom Shower/Tub: Chief Strategy Officer: Standard     Home Equipment: Cane - single point          Prior Functioning/Environment Level of Independence: Independent with assistive device(s)        Comments: Ambulating with SPC at baseline, indep with ADL, 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ical engineer; only 1 fall on Thursday prior to hospitalization        OT Problem List: Decreased strength;Decreased coordination;Decreased activity tolerance;Impaired balance (sitting and/or standing);Decreased knowledge of use of DME or AE;Impaired UE functional use      OT Treatment/Interventions: Self-care/ADL training;Therapeutic exercise;Therapeutic activities;Neuromuscular education;DME and/or AE instruction;Patient/family education;Balance training    OT Goals(Current goals can be found in the care plan section) Acute Rehab OT Goals Patient Stated Goal: to get better and return to work OT Goal Formulation: With patient/family Time For Goal Achievement: 07/03/19 Potential to Achieve Goals: Good ADL Goals Pt Will Perform Upper Body Dressing: sitting;with modified independence Pt Will Perform Lower Body Dressing: sit to/from stand;with min  guard assist Pt Will Transfer to Toilet: with min guard assist;ambulating(LRAD for amb) Additional ADL Goal #1: Pt will be independent with  [facility] Ex program for LUE to improve functional use for ADL and IADL  OT Frequency: Min 3X/week   Barriers to D/C:            Co-evaluation PT/OT/SLP Co-Evaluation/Treatment: Yes Reason for Co-Treatment: For patient/therapist safety;To address functional/ADL transfers PT goals addressed during session: Mobility/safety with mobility;Balance;Proper use of DME OT goals addressed during session: ADL's and self-care;Proper use of Adaptive equipment and DME      AM-PAC OT "6 Clicks" Daily Activity     Outcome Measure Help from another person eating meals?: None Help from another person taking care of personal grooming?: A Little Help from another person toileting, which includes using toliet, bedpan, or urinal?: A Lot Help from another person bathing (including washing, rinsing, drying)?: A Lot Help from another person to put on and taking off regular upper body clothing?: A Little Help from another person to put on and taking off regular lower body clothing?: A Lot 6 Click Score: 16   End of Session Equipment Utilized During Treatment: Gait belt;Rolling walker Nurse Communication: Mobility status  Activity Tolerance: Patient tolerated treatment well Patient left: in chair;with call bell/phone within reach;with chair alarm set;with family/visitor present  OT Visit Diagnosis: Other abnormalities of gait and mobility (R26.89);Hemiplegia and hemiparesis Hemiplegia - Right/Left: Left Hemiplegia - dominant/non-dominant: Non-Dominant Hemiplegia - caused by: Cerebral infarction                Time: 5038-8828 OT Time Calculation (min): 25 min Charges:  OT General Charges $OT Visit: 1 Visit OT Evaluation $OT Eval Moderate Complexity: 1 Mod  Jeni Salles, MPH, MS, OTR/L ascom [phone] 06/19/19, 2:30 PM

## 2019-06-19 NOTE — Progress Notes (Signed)
*  PRELIMINARY RESULTS* Echocardiogram 2D Echocardiogram has been performed.  Juan Hughes 06/19/2019, 9:29 AM

## 2019-06-19 NOTE — Progress Notes (Signed)
Rehab Admissions Coordinator Note:  Per OT recommendation, patient was screened by Stephania Fragmin for appropriateness for an Inpatient Acute Rehab Consult.  At this time, we are recommending Inpatient Rehab consult.  I will place an order so we can evaluate the patient.  Note, with no SLP needs, pt will need PT evaluation in order to proceed with CIR assessment.   Stephania Fragmin 06/19/2019, 3:32 PM  I can be reached at [phone].

## 2019-06-19 NOTE — Consult Note (Signed)
Subjective: Still persistent L sided weakness   Past Medical History:  Diagnosis Date  . Hypertension     History reviewed. No pertinent surgical history.  Family History  Problem Relation Age of Onset  . Hypertension Mother     Social History:  reports that he has been smoking cigars. He has never used smokeless tobacco. He reports that he does not drink alcohol or use drugs.  No Known Allergies   Physical Examination: Blood pressure (!) 182/101, pulse 71, temperature 98.4 F (36.9 C), temperature source Oral, resp. rate 18, height 5' (1.524 m), weight 68 kg, SpO2 98 %.   Mental Status: Alert, oriented, thought content appropriate.  Speech fluent without evidence of aphasia.  Able to follow 3 step commands without difficulty. Cranial Nerves: II: Discs flat bilaterally; Visual fields grossly normal, pupils equal, round, reactive to light and accommodation III,IV, VI: ptosis not present, extra-ocular motions intact bilaterally V,VII: smile symmetric, facial light touch sensation normal bilaterally VIII: hearing normal bilaterally XI: bilateral shoulder shrug XII: midline tongue extension Motor: Right : Upper extremity   5/5    Left:     Upper extremity   4-/5  Lower extremity   5/5     Lower extremity   4-/5 Tone and bulk:normal tone throughout; no atrophy noted Sensory: Pinprick and light touch subjective decrease L side Deep Tendon Reflexes: 1+ and symmetric throughout Plantars: Right: downgoing   Left: downgoing Cerebellar: normal finger-to-nose, Gait: not tested       Laboratory Studies:   Basic Metabolic Panel: Recent Labs  Lab 06/18/19 1129  NA 142  K 2.9*  CL 105  CO2 27  GLUCOSE 106*  BUN 19  CREATININE 1.05  CALCIUM 9.5    Liver Function Tests: Recent Labs  Lab 06/18/19 1129  AST 16  ALT 12  ALKPHOS 75  BILITOT 0.9  PROT 7.4  ALBUMIN 4.1   No results for input(s): LIPASE, AMYLASE in the last 168 hours. No results for input(s): AMMONIA  in the last 168 hours.  CBC: Recent Labs  Lab 06/18/19 1129  WBC 6.7  NEUTROABS 5.0  HGB 15.7  HCT 48.2  MCV 86.1  PLT 209    Cardiac Enzymes: No results for input(s): CKTOTAL, CKMB, CKMBINDEX, TROPONINI in the last 168 hours.  BNP: Invalid input(s): POCBNP  CBG: Recent Labs  Lab 06/18/19 1129 06/18/19 1202  GLUCAP 99 98    Microbiology: Results for orders placed or performed during the hospital encounter of 06/18/19  Respiratory Panel by RT PCR (Flu A&B, Covid) - Nasopharyngeal Swab     Status: None   Collection Time: 06/18/19 12:18 PM   Specimen: Nasopharyngeal Swab  Result Value Ref Range Status   SARS Coronavirus 2 by RT PCR NEGATIVE NEGATIVE Final    Comment: (NOTE) SARS-CoV-2 target nucleic acids are NOT DETECTED. The SARS-CoV-2 RNA is generally detectable in upper respiratoy specimens during the acute phase of infection. The lowest concentration of SARS-CoV-2 viral copies this assay can detect is 131 copies/mL. A negative result does not preclude SARS-Cov-2 infection and should not be used as the sole basis for treatment or other patient management decisions. A negative result may occur with  improper specimen collection/handling, submission of specimen other than nasopharyngeal swab, presence of viral mutation(s) within the areas targeted by this assay, and inadequate number of viral copies (<131 copies/mL). A negative result must be combined with clinical observations, patient history, and epidemiological information. The expected result is Negative. Fact Sheet for Patients:  https://www.moore.com/ Fact Sheet for Healthcare Providers:  https://www.young.biz/ This test is not yet ap proved or cleared by the [place] FDA and  has been authorized for detection and/or diagnosis of SARS-CoV-2 by FDA under an Emergency Use Authorization (EUA). This EUA will remain  in effect (meaning this test can be used) for the  duration of the COVID-19 declaration under Section 564(b)(1) of the Act, 21 U.S.C. section 360bbb-3(b)(1), unless the authorization is terminated or revoked sooner.    Influenza A by PCR NEGATIVE NEGATIVE Final   Influenza B by PCR NEGATIVE NEGATIVE Final    Comment: (NOTE) The Xpert Xpress SARS-CoV-2/FLU/RSV assay is intended as an aid in  the diagnosis of influenza from Nasopharyngeal swab specimens and  should not be used as a sole basis for treatment. Nasal washings and  aspirates are unacceptable for Xpert Xpress SARS-CoV-2/FLU/RSV  testing. Fact Sheet for Patients: https://www.moore.com/ Fact Sheet for Healthcare Providers: https://www.young.biz/ This test is not yet approved or cleared by the [place] FDA and  has been authorized for detection and/or diagnosis of SARS-CoV-2 by  FDA under an Emergency Use Authorization (EUA). This EUA will remain  in effect (meaning this test can be used) for the duration of the  Covid-19 declaration under Section 564(b)(1) of the Act, 21  U.S.C. section 360bbb-3(b)(1), unless the authorization is  terminated or revoked. Performed at [facility], [address] [place], [place] [address]     Coagulation Studies: Recent Labs    06/18/19 1129  LABPROT 12.0  INR 0.9    Urinalysis: No results for input(s): COLORURINE, LABSPEC, PHURINE, GLUCOSEU, HGBUR, BILIRUBINUR, KETONESUR, PROTEINUR, UROBILINOGEN, NITRITE, LEUKOCYTESUR in the last 168 hours.  Invalid input(s): APPERANCEUR  Lipid Panel:     Component Value Date/Time   CHOL 210 (H) 06/19/2019 0422   TRIG 113 06/19/2019 0422   HDL 51 06/19/2019 0422   CHOLHDL 4.1 06/19/2019 0422   VLDL 23 06/19/2019 0422   LDLCALC 136 (H) 06/19/2019 0422    HgbA1C: No results found for: HGBA1C  Urine Drug Screen:  No results found for: LABOPIA, COCAINSCRNUR, LABBENZ, AMPHETMU, THCU, LABBARB  Alcohol Level: No results for input(s): ETH in  the last 168 hours.  Other results: EKG: normal EKG, normal sinus rhythm, unchanged from previous tracings.  Imaging: CT HEAD WO CONTRAST  Result Date: 06/18/2019 CLINICAL DATA:  Left leg and arm weakness.  Subsequent fall EXAM: CT HEAD WITHOUT CONTRAST TECHNIQUE: Contiguous axial images were obtained from the base of the skull through the vertex without intravenous contrast. COMPARISON:  None. FINDINGS: Brain: Chronic small vessel ischemic gliosis in the cerebral white matter, fairly extensive. No evidence of acute infarct, hemorrhage, hydrocephalus, or mass. Vascular: Vertebrobasilar tortuosity.  No hyperdense vessel. Skull: Negative Sinuses/Orbits: Negative IMPRESSION: 1. No acute finding. 2. Extensive chronic small vessel ischemia. Electronically Signed   By: Marnee Spring M.D.   On: 06/18/2019 11:44   MR ANGIO HEAD WO CONTRAST  Result Date: 06/18/2019 CLINICAL DATA:  64 year old male with left side weakness, fall. Hypertensive. EXAM: MRI HEAD WITHOUT CONTRAST MRA HEAD WITHOUT CONTRAST TECHNIQUE: Multiplanar, multiecho pulse sequences of the brain and surrounding structures were obtained without intravenous contrast. Angiographic images of the head were obtained using MRA technique without contrast. COMPARISON:  Head CT earlier today. FINDINGS: MRI HEAD FINDINGS Brain: Curvilinear area of restricted diffusion in the posterior right corona radiata tracking toward the posterior lentiform, in an area of about 11 millimeters (series 8, image 55). Faint T2 and FLAIR hyperintensity. No  associated hemorrhage or mass effect. No other restricted diffusion. Patchy and confluent bilateral cerebral white matter T2 and FLAIR hyperintensity, also visible by CT today. Bilateral temporal lobe involvement. No cortical encephalomalacia or chronic cerebral blood products. Outside of the acute finding the deep gray nuclei are within normal limits. Brainstem and cerebellum appear within normal limits. No midline shift,  mass effect, evidence of mass lesion, ventriculomegaly, extra-axial collection or acute intracranial hemorrhage. Cervicomedullary junction and pituitary are within normal limits. Vascular: Major intracranial vascular flow voids are preserved, with generalized intracranial artery tortuosity. Basilar artery ectasia. The distal left vertebral artery appears dominant. Skull and upper cervical spine: Negative visible cervical spine. Normal bone marrow signal. Sinuses/Orbits: Negative orbits. Paranasal sinuses are clear. Other: Mild left mastoid effusion. Trace retained secretions in the nasopharynx. Right mastoids are clear. MRA HEAD FINDINGS Antegrade flow in the posterior circulation with dominant and dolichoectatic distal left vertebral artery. Normal left PICA. The right V4 segment is diminutive and might be supplied in a retrograde fashion, uncertain. No visible right vertebral artery flow void in the neck on sagittal T1 brain imaging makes this more likely. The left vertebral supplies the basilar without stenosis. Ectatic basilar artery. Widely patent SCA and PCA origins. Posterior communicating arteries are diminutive or absent. The left PCA branches are within normal limits. The right P1 segment is tortuous but otherwise normal. There is right P2 segment signal loss which may be related to a downward trajectory of the vessel, uncertain. Distal right PCA branch enhancement seems preserved. Antegrade flow in both ICA siphons. Tortuous carotid segments. No siphon stenosis. Patent carotid termini. Normal MCA and ACA origins. Visible ACA branches are within normal limits. MCA M1 segments and bifurcations are patent without stenosis. Visible bilateral MCA branches are within normal limits. IMPRESSION: 1. Small acute lacunar infarct in the posterior right corona radiata. No associated hemorrhage or mass effect. 2. Underlying advanced bilateral cerebral white matter signal changes, nonspecific but favored due to chronic  small vessel disease. 3. 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 And: - possible occlusion of the cervical right vertebral artery, supplied in a retrograde fashion from the vertebrobasilar junction. This is unrelated to #1. - questionable stenosis of the right PCA P2 segment, favor artifact instead. Electronically Signed   By: Odessa Fleming M.D.   On: 06/18/2019 18:31   MR BRAIN WO CONTRAST  Result Date: 06/18/2019 CLINICAL DATA:  64 year old male with left side weakness, fall. Hypertensive. EXAM: MRI HEAD WITHOUT CONTRAST MRA HEAD WITHOUT CONTRAST TECHNIQUE: Multiplanar, multiecho pulse sequences of the brain and surrounding structures were obtained without intravenous contrast. Angiographic images of the head were obtained using MRA technique without contrast. COMPARISON:  Head CT earlier today. FINDINGS: MRI HEAD FINDINGS Brain: Curvilinear area of restricted diffusion in the posterior right corona radiata tracking toward the posterior lentiform, in an area of about 11 millimeters (series 8, image 55). Faint T2 and FLAIR hyperintensity. No associated hemorrhage or mass effect. No other restricted diffusion. Patchy and confluent bilateral cerebral white matter T2 and FLAIR hyperintensity, also visible by CT today. Bilateral temporal lobe involvement. No cortical encephalomalacia or chronic cerebral blood products. Outside of the acute finding the deep gray nuclei are within normal limits. Brainstem and cerebellum appear within normal limits. No midline shift, mass effect, evidence of mass lesion, ventriculomegaly, extra-axial collection or acute intracranial hemorrhage. Cervicomedullary junction and pituitary are within normal limits. Vascular: Major intracranial vascular flow voids are preserved, with generalized intracranial artery tortuosity. Basilar artery ectasia. The  distal left vertebral artery appears dominant. Skull and upper cervical spine: Negative visible cervical  spine. Normal bone marrow signal. Sinuses/Orbits: Negative orbits. Paranasal sinuses are clear. Other: Mild left mastoid effusion. Trace retained secretions in the nasopharynx. Right mastoids are clear. MRA HEAD FINDINGS Antegrade flow in the posterior circulation with dominant and dolichoectatic distal left vertebral artery. Normal left PICA. The right V4 segment is diminutive and might be supplied in a retrograde fashion, uncertain. No visible right vertebral artery flow void in the neck on sagittal T1 brain imaging makes this more likely. The left vertebral supplies the basilar without stenosis. Ectatic basilar artery. Widely patent SCA and PCA origins. Posterior communicating arteries are diminutive or absent. The left PCA branches are within normal limits. The right P1 segment is tortuous but otherwise normal. There is right P2 segment signal loss which may be related to a downward trajectory of the vessel, uncertain. Distal right PCA branch enhancement seems preserved. Antegrade flow in both ICA siphons. Tortuous carotid segments. No siphon stenosis. Patent carotid termini. Normal MCA and ACA origins. Visible ACA branches are within normal limits. MCA M1 segments and bifurcations are patent without stenosis. Visible bilateral MCA branches are within normal limits. IMPRESSION: 1. Small acute lacunar infarct in the posterior right corona radiata. No associated hemorrhage or mass effect. 2. Underlying advanced bilateral cerebral white matter signal changes, nonspecific but favored due to chronic small vessel disease. 3. 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 And: - possible occlusion of the cervical right vertebral artery, supplied in a retrograde fashion from the vertebrobasilar junction. This is unrelated to #1. - questionable stenosis of the right PCA P2 segment, favor artifact instead. Electronically Signed   By: Odessa Fleming M.D.   On: 06/18/2019 18:31   US Carotid  Bilateral (at [facility] and [facility] only)  Result Date: 06/19/2019 CLINICAL DATA:  Stroke.  History of hypertension and smoking. EXAM: BILATERAL CAROTID DUPLEX ULTRASOUND TECHNIQUE: Wallace Cullens scale imaging, color Doppler and duplex ultrasound were performed of bilateral carotid and vertebral arteries in the neck. COMPARISON:  MRI/MRA - earlier same day FINDINGS: Criteria: Quantification of carotid stenosis is based on velocity parameters that correlate the residual internal carotid diameter with NASCET-based stenosis levels, using the diameter of the distal internal carotid lumen as the denominator for stenosis measurement. The following velocity measurements were obtained: RIGHT ICA: 74/50 cm/sec CCA: 85/18 cm/sec SYSTOLIC ICA/CCA RATIO:  0.9 ECA: 62 cm/sec LEFT ICA: 67/18 cm/sec CCA: 82/17 cm/sec SYSTOLIC ICA/CCA RATIO:  0.8 ECA: 82 cm/sec RIGHT CAROTID ARTERY: There is a minimal amount of eccentric echogenic plaque involving the proximal aspect of the right internal carotid artery (image 22, not resulting in elevated peak systolic velocities within the interrogated course the right internal carotid artery to suggest a hemodynamically significant stenosis. RIGHT VERTEBRAL ARTERY:  Not visualized LEFT CAROTID ARTERY: There is a minimal amount of intimal thickening involving the distal aspect the left common carotid artery, extending to involve the left carotid bulb, origin and proximal aspects of the left internal carotid artery, not resulting in hemodynamically significant stenosis within the interrogated course the left internal carotid artery to suggest a hemodynamically significant stenosis. LEFT VERTEBRAL ARTERY:  Antegrade flow IMPRESSION: 1. Minimal amount of bilateral intimal thickening/atherosclerotic plaque, right greater than left, not resulting in a hemodynamically significant stenosis within either internal carotid artery. 2. Non visualization of a patent right vertebral artery, an age-indeterminate finding in the  absence of prior examinations and of uncertain clinical significance as unilateral vertebral artery  dominance is a relatively common congenital variant. Clinical correlation is advised. 3. Antegrade flow within the left vertebral artery. Electronically Signed   By: Sandi Mariscal M.D.   On: 06/19/2019 07:29     Assessment/Plan:   64 y.o. male with hx of HNT non on any medications. Patient last known well around 10-11PM last night prior to admission . Woke up this AM at 3 with L sided deficits. NIHSS of 3 for LUE, LLE drift and subjective numbness on L side. Speech at baseline.   MRI brain with R CR stroke in setting of HNT and small vessel disease  - ASA 325 - BP management agree with BP goal <160 as over 24 hrs post stroke - needs pt/ot speech - will likely need rehab - appreciate translator assistance.    06/19/2019, 12:10 PM

## 2019-06-19 NOTE — Progress Notes (Signed)
PROGRESS NOTE    Juan Hughes  NOB:[id number] DOB: 03/18/1956 DOA: 06/18/2019 PCP: Patient, No Pcp Per      Brief Narrative:  Juan Hughes is a 64 y.o. M with hx HTN untreated who presented with acute left hemiparesis.  Around 2 AM the morning of admission, the patient noticed left-sided weakness in the arm and leg without slurred speech or facial droop.  Presented to the ER around 11 AM, still with left hemiparesis.  Code stroke called, CT head unremarkable, neurology were consulted.      Assessment & Plan:  Stroke Patient admitted with new left hemiparesis.  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 Overnight MRI showed small vessel stroke. Discussed with neurology, will start blood pressure control -Start amlodipine -Start losartan -Use labetalol as needed for severe range pressures  Hypokalemia -Check mag -Supplement potassium           Disposition: The patient was admitted with acute left hemiparesis, MRI showed stroke.  At baseline, the patient is independent, still works. However at present, as a result of his stroke, he has left hemiparesis and inability to stand. He will need extensive rehabilitation, and efforts at placement are ongoing, and therefore continued inpatient services are reasonable and expected        MDM: The below labs and imaging reports were  reviewed and summarized above.  Medication management as above.   DVT prophylaxis: Lovenox Code Status: FULL Family Communication: Sister at bedside    Consultants:   Neurology  Procedures:   1/28 MRI brain / MRA head -- right corona radiata infarction, possible occlusion of the cervical right vertebral artery  1/29 US carotid -- no blockages  1/29 echo cardiogram -- pending  Antimicrobials:   None   Culture data:   None           Subjective: All history collected through video phonic interpreter. He has persistent left-sided hemiparesis, inability to stand. He has bilateral knee pain. He has no fever, cough. His voice is normal. He is no longer nauseated, his appetite is good. He has no trouble swallowing.  Objective: Vitals:   06/19/19 0406 06/19/19 0737 06/19/19 1142 06/19/19 1309  BP: (!) 171/102 (!) 192/102 (!) 182/101 (!) 191/103  Pulse: 79 71  68  Resp: 16 18  18   Temp: 98.7 F (37.1 C) 98.4 F (36.9 C)  98.4 F (36.9 C)  TempSrc: Oral Oral    SpO2: 98% 98%  98%  Weight:      Height:        Intake/Output Summary (Last 24 hours) at 06/19/2019 1454 Last data filed at 06/19/2019 9476 Gross per 24 hour  Intake 1242.13 ml  Output 400 ml  Net 842.13 ml   Filed Weights   06/18/19 1111  Weight: 68 kg    Examination: General appearance:  adult male, alert and in no acute distress.   HEENT: Anicteric, conjunctiva pink, lids and lashes normal. No nasal deformity, discharge, epistaxis.  Lips moist.   Skin: Warm and dry. No jaundice.  No suspicious rashes or lesions. Cardiac: RRR, nl S1-S2, no murmurs appreciated.  Capillary refill is brisk.  JVP not visible.  No LE edema.  Radial pulses 2+ and symmetric. Respiratory: Normal respiratory rate and rhythm.  CTAB without rales or wheezes. Abdomen: Abdomen soft. No TTP or guarding. No ascites, distension, hepatosplenomegaly.   MSK: No deformities or effusions. Neuro: Awake and alert.  EOMI, left side is  weak.  Speech fluent.    Psych: Sensorium intact and responding to questions, attention normal. Affect normal.  Judgment and insight appear normal.    Data Reviewed: I have personally reviewed following labs and imaging studies:  CBC: Recent Labs  Lab 06/18/19 1129  WBC 6.7  NEUTROABS 5.0  HGB 15.7  HCT 48.2  MCV 86.1  PLT 209   Basic Metabolic Panel: Recent Labs  Lab 06/18/19 1129  NA 142  K 2.9*  CL 105  CO2 27  GLUCOSE 106*  BUN 19  CREATININE 1.05  CALCIUM 9.5   GFR: Estimated Creatinine Clearance: 59.8 mL/min (by C-G formula based on SCr of 1.05 mg/dL). Liver Function Tests: Recent Labs  Lab 06/18/19 1129  AST 16  ALT 12  ALKPHOS 75  BILITOT 0.9  PROT 7.4  ALBUMIN 4.1   No results for input(s): LIPASE, AMYLASE in the last 168 hours. No results for input(s): AMMONIA in the last 168 hours. Coagulation Profile: Recent Labs  Lab 06/18/19 1129  INR 0.9   Cardiac Enzymes: No results for input(s): CKTOTAL, CKMB, CKMBINDEX, TROPONINI in the last 168 hours. BNP (last 3 results) No results for input(s): PROBNP in the last 8760 hours. HbA1C: No results for input(s): HGBA1C in the last 72 hours. CBG: Recent Labs  Lab 06/18/19 1129 06/18/19 1202  GLUCAP 99 98   Lipid Profile: Recent Labs    06/19/19 0422  CHOL 210*  HDL 51  LDLCALC 136*  TRIG 113  CHOLHDL 4.1   Thyroid Function Tests: No results for input(s): TSH, T4TOTAL, FREET4, T3FREE, THYROIDAB in the last 72 hours. Anemia Panel: No results for input(s): VITAMINB12, FOLATE, FERRITIN, TIBC, IRON, RETICCTPCT in the last 72 hours. Urine analysis: No results found for: COLORURINE, APPEARANCEUR, LABSPEC, PHURINE, GLUCOSEU, HGBUR, BILIRUBINUR, KETONESUR, PROTEINUR, UROBILINOGEN, NITRITE, LEUKOCYTESUR Sepsis Labs: @LABRCNTIP (procalcitonin:4,lacticacidven:4)  ) Recent Results (from the past 240 hour(s))  Respiratory Panel by RT PCR (Flu A&B, Covid) - Nasopharyngeal Swab     Status: None    Collection Time: 06/18/19 12:18 PM   Specimen: Nasopharyngeal Swab  Result Value Ref Range Status   SARS Coronavirus 2 by RT PCR NEGATIVE NEGATIVE Final    Comment: (NOTE) SARS-CoV-2 target nucleic acids are NOT DETECTED. The SARS-CoV-2 RNA is generally detectable in upper respiratoy specimens during the acute phase of infection. The lowest concentration of SARS-CoV-2 viral copies this assay can detect is 131 copies/mL. A negative result does not preclude SARS-Cov-2 infection and should not be used as the sole basis for treatment or other patient management decisions. A negative result may occur with  improper specimen collection/handling, submission of specimen other than nasopharyngeal swab, presence of viral mutation(s) within the areas targeted by this assay, and inadequate number of viral copies (<131 copies/mL). A negative result must be combined with clinical observations, patient history, and epidemiological information. The expected result is Negative. Fact Sheet for Patients:  06/20/19 Fact Sheet for Healthcare Providers:  https://www.moore.com/ This test is not yet ap proved or cleared by the https://www.young.biz/ FDA and  has been authorized for detection and/or diagnosis  of SARS-CoV-2 by FDA under an Emergency Use Authorization (EUA). This EUA will remain  in effect (meaning this test can be used) for the duration of the COVID-19 declaration under Section 564(b)(1) of the Act, 21 U.S.C. section 360bbb-3(b)(1), unless the authorization is terminated or revoked sooner.    Influenza A by PCR NEGATIVE NEGATIVE Final   Influenza B by PCR NEGATIVE NEGATIVE Final    Comment: (NOTE) The Xpert Xpress SARS-CoV-2/FLU/RSV assay is intended as an aid in  the diagnosis of influenza from Nasopharyngeal swab specimens and  should not be used as a sole basis for treatment. Nasal washings and  aspirates are unacceptable for Xpert Xpress  SARS-CoV-2/FLU/RSV  testing. Fact Sheet for Patients: https://www.moore.com/ Fact Sheet for Healthcare Providers: https://www.young.biz/ This test is not yet approved or cleared by the [place] FDA and  has been authorized for detection and/or diagnosis of SARS-CoV-2 by  FDA under an Emergency Use Authorization (EUA). This EUA will remain  in effect (meaning this test can be used) for the duration of the  Covid-19 declaration under Section 564(b)(1) of the Act, 21  U.S.C. section 360bbb-3(b)(1), unless the authorization is  terminated or revoked. Performed at [facility], [address], [place], [place] [address]          Radiology Studies: CT HEAD WO CONTRAST  Result Date: 06/18/2019 CLINICAL DATA:  Left leg and arm weakness.  Subsequent fall EXAM: CT HEAD WITHOUT CONTRAST TECHNIQUE: Contiguous axial images were obtained from the base of the skull through the vertex without intravenous contrast. COMPARISON:  None. FINDINGS: Brain: Chronic small vessel ischemic gliosis in the cerebral white matter, fairly extensive. No evidence of acute infarct, hemorrhage, hydrocephalus, or mass. Vascular: Vertebrobasilar tortuosity.  No hyperdense vessel. Skull: Negative Sinuses/Orbits: Negative IMPRESSION: 1. No acute finding. 2. Extensive chronic small vessel ischemia. Electronically Signed   By: Marnee Spring M.D.   On: 06/18/2019 11:44   MR ANGIO HEAD WO CONTRAST  Result Date: 06/18/2019 CLINICAL DATA:  64 year old male with left side weakness, fall. Hypertensive. EXAM: MRI HEAD WITHOUT CONTRAST MRA HEAD WITHOUT CONTRAST TECHNIQUE: Multiplanar, multiecho pulse sequences of the brain and surrounding structures were obtained without intravenous contrast. Angiographic images of the head were obtained using MRA technique without contrast. COMPARISON:  Head CT earlier today. FINDINGS: MRI HEAD FINDINGS Brain: Curvilinear area of restricted  diffusion in the posterior right corona radiata tracking toward the posterior lentiform, in an area of about 11 millimeters (series 8, image 55). Faint T2 and FLAIR hyperintensity. No associated hemorrhage or mass effect. No other restricted diffusion. Patchy and confluent bilateral cerebral white matter T2 and FLAIR hyperintensity, also visible by CT today. Bilateral temporal lobe involvement. No cortical encephalomalacia or chronic cerebral blood products. Outside of the acute finding the deep gray nuclei are within normal limits. Brainstem and cerebellum appear within normal limits. No midline shift, mass effect, evidence of mass lesion, ventriculomegaly, extra-axial collection or acute intracranial hemorrhage. Cervicomedullary junction and pituitary are within normal limits. Vascular: Major intracranial vascular flow voids are preserved, with generalized intracranial artery tortuosity. Basilar artery ectasia. The distal left vertebral artery appears dominant. Skull and upper cervical spine: Negative visible cervical spine. Normal bone marrow signal. Sinuses/Orbits: Negative orbits. Paranasal sinuses are clear. Other: Mild left mastoid effusion. Trace retained secretions in the nasopharynx. Right mastoids are clear. MRA HEAD FINDINGS Antegrade flow in the posterior circulation with dominant and dolichoectatic distal left vertebral artery. Normal left PICA. The right V4 segment is diminutive and might  be supplied in a retrograde fashion, uncertain. No visible right vertebral artery flow void in the neck on sagittal T1 brain imaging makes this more likely. The left vertebral supplies the basilar without stenosis. Ectatic basilar artery. Widely patent SCA and PCA origins. Posterior communicating arteries are diminutive or absent. The left PCA branches are within normal limits. The right P1 segment is tortuous but otherwise normal. There is right P2 segment signal loss which may be related to a downward trajectory of  the vessel, uncertain. Distal right PCA branch enhancement seems preserved. Antegrade flow in both ICA siphons. Tortuous carotid segments. No siphon stenosis. Patent carotid termini. Normal MCA and ACA origins. Visible ACA branches are within normal limits. MCA M1 segments and bifurcations are patent without stenosis. Visible bilateral MCA branches are within normal limits. IMPRESSION: 1. Small acute lacunar infarct in the posterior right corona radiata. No associated hemorrhage or mass effect. 2. Underlying advanced bilateral cerebral white matter signal changes, nonspecific but favored due to chronic small vessel disease. 3. 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 And: - possible occlusion of the cervical right vertebral artery, supplied in a retrograde fashion from the vertebrobasilar junction. This is unrelated to #1. - questionable stenosis of the right PCA P2 segment, favor artifact instead. Electronically Signed   By: Hughes Fleming M.D.   On: 06/18/2019 18:31   MR BRAIN WO CONTRAST  Result Date: 06/18/2019 CLINICAL DATA:  64 year old male with left side weakness, fall. Hypertensive. EXAM: MRI HEAD WITHOUT CONTRAST MRA HEAD WITHOUT CONTRAST TECHNIQUE: Multiplanar, multiecho pulse sequences of the brain and surrounding structures were obtained without intravenous contrast. Angiographic images of the head were obtained using MRA technique without contrast. COMPARISON:  Head CT earlier today. FINDINGS: MRI HEAD FINDINGS Brain: Curvilinear area of restricted diffusion in the posterior right corona radiata tracking toward the posterior lentiform, in an area of about 11 millimeters (series 8, image 55). Faint T2 and FLAIR hyperintensity. No associated hemorrhage or mass effect. No other restricted diffusion. Patchy and confluent bilateral cerebral white matter T2 and FLAIR hyperintensity, also visible by CT today. Bilateral temporal lobe involvement. No cortical encephalomalacia  or chronic cerebral blood products. Outside of the acute finding the deep gray nuclei are within normal limits. Brainstem and cerebellum appear within normal limits. No midline shift, mass effect, evidence of mass lesion, ventriculomegaly, extra-axial collection or acute intracranial hemorrhage. Cervicomedullary junction and pituitary are within normal limits. Vascular: Major intracranial vascular flow voids are preserved, with generalized intracranial artery tortuosity. Basilar artery ectasia. The distal left vertebral artery appears dominant. Skull and upper cervical spine: Negative visible cervical spine. Normal bone marrow signal. Sinuses/Orbits: Negative orbits. Paranasal sinuses are clear. Other: Mild left mastoid effusion. Trace retained secretions in the nasopharynx. Right mastoids are clear. MRA HEAD FINDINGS Antegrade flow in the posterior circulation with dominant and dolichoectatic distal left vertebral artery. Normal left PICA. The right V4 segment is diminutive and might be supplied in a retrograde fashion, uncertain. No visible right vertebral artery flow void in the neck on sagittal T1 brain imaging makes this more likely. The left vertebral supplies the basilar without stenosis. Ectatic basilar artery. Widely patent SCA and PCA origins. Posterior communicating arteries are diminutive or absent. The left PCA branches are within normal limits. The right P1 segment is tortuous but otherwise normal. There is right P2 segment signal loss which may be related to a downward trajectory of the vessel, uncertain. Distal right PCA branch enhancement seems preserved. Antegrade flow in both ICA  siphons. Tortuous carotid segments. No siphon stenosis. Patent carotid termini. Normal MCA and ACA origins. Visible ACA branches are within normal limits. MCA M1 segments and bifurcations are patent without stenosis. Visible bilateral MCA branches are within normal limits. IMPRESSION: 1. Small acute lacunar infarct in the  posterior right corona radiata. No associated hemorrhage or mass effect. 2. Underlying advanced bilateral cerebral white matter signal changes, nonspecific but favored due to chronic small vessel disease. 3. 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 And: - possible occlusion of the cervical right vertebral artery, supplied in a retrograde fashion from the vertebrobasilar junction. This is unrelated to #1. - questionable stenosis of the right PCA P2 segment, favor artifact instead. Electronically Signed   By: Hughes Fleming M.D.   On: 06/18/2019 18:31   US Carotid Bilateral (at [facility] and [facility] only)  Result Date: 06/19/2019 CLINICAL DATA:  Stroke.  History of hypertension and smoking. EXAM: BILATERAL CAROTID DUPLEX ULTRASOUND TECHNIQUE: Wallace Cullens scale imaging, color Doppler and duplex ultrasound were performed of bilateral carotid and vertebral arteries in the neck. COMPARISON:  MRI/MRA - earlier same day FINDINGS: Criteria: Quantification of carotid stenosis is based on velocity parameters that correlate the residual internal carotid diameter with NASCET-based stenosis levels, using the diameter of the distal internal carotid lumen as the denominator for stenosis measurement. The following velocity measurements were obtained: RIGHT ICA: 74/50 cm/sec CCA: 85/18 cm/sec SYSTOLIC ICA/CCA RATIO:  0.9 ECA: 62 cm/sec LEFT ICA: 67/18 cm/sec CCA: 82/17 cm/sec SYSTOLIC ICA/CCA RATIO:  0.8 ECA: 82 cm/sec RIGHT CAROTID ARTERY: There is a minimal amount of eccentric echogenic plaque involving the proximal aspect of the right internal carotid artery (image 22, not resulting in elevated peak systolic velocities within the interrogated course the right internal carotid artery to suggest a hemodynamically significant stenosis. RIGHT VERTEBRAL ARTERY:  Not visualized LEFT CAROTID ARTERY: There is a minimal amount of intimal thickening involving the distal aspect the left common carotid artery, extending to  involve the left carotid bulb, origin and proximal aspects of the left internal carotid artery, not resulting in hemodynamically significant stenosis within the interrogated course the left internal carotid artery to suggest a hemodynamically significant stenosis. LEFT VERTEBRAL ARTERY:  Antegrade flow IMPRESSION: 1. Minimal amount of bilateral intimal thickening/atherosclerotic plaque, right greater than left, not resulting in a hemodynamically significant stenosis within either internal carotid artery. 2. 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 Clinical correlation is advised. 3. Antegrade flow within the left vertebral artery. Electronically Signed   By: Simonne Come M.D.   On: 06/19/2019 07:29        Scheduled Meds: .  stroke: mapping our early stages of recovery book   Does not apply Once  . amLODipine  5 mg Oral Daily  . aspirin  300 mg Rectal Daily   Or  . aspirin EC  325 mg Oral Daily  . atorvastatin  80 mg Oral q1800  . enoxaparin (LOVENOX) injection  40 mg Subcutaneous Q24H  . feeding supplement (ENSURE ENLIVE)  237 mL Oral BID BM  . losartan  25 mg Oral Daily  . multivitamin with minerals  1 tablet Oral Daily  . nicotine  21 mg Transdermal Daily  . sodium chloride flush  3 mL Intravenous Once   Continuous Infusions:   LOS: 0 days    Time spent: 25 min  Alberteen Sam, MD [facility] 06/19/2019, 2:54 PM     Please page though AMION or Epic secure chat:  For [employer], Higher education careers adviser

## 2019-06-19 NOTE — Evaluation (Addendum)
Physical Therapy Evaluation Patient Details Name: Juan Hughes MRN: [id number] DOB: Oct 25, 1955 Today's Date: 06/19/2019   History of Present Illness  Pt is 64 yo male PMH of HTN presented to ED 1/28 for L sided weakness. MRI+ for small acute lacunar infarct in the posterior right coronaradiata. No associated hemorrhage or mass effect. 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 Pt reported prior to admission, independent worked full time. Lives with his family, including his adult son, sister in room throughout evaluation.   Upon assessment the patient demonstrated weakness of LLE especially knee extension and DF, reported no sensory deficits, and significant difficulty of LLE and LUE coordination. Pt trialled with sit <> stand RW and modAx2, due to difficulties of coordinating LUE with RW and coordination/weight bearing of LLE. Min-modAx2 handheld assist to ambulate to recliner. Pt exhibited decreased LLE foot clearance, difficulty with weight shift, and difficulty with foot placement. Pt did also complain of chronic R knee pain, RN notified of pain medication request. PT, pt, and family reviewed importance of continued mobility and LE exercises. Overall the patient demonstrated deficits (see "PT Problem List") that impede the patient's functional abilities, safety, and mobility and would benefit from skilled PT intervention. Pt is extremely motivated to return to PLOF.  Recommend transition to acute inpatient rehab upon discharge for high-intensity, post-acute rehab services.       Follow Up Recommendations CIR    Equipment Recommendations  Other (comment)(TBD)    Recommendations for Other Services OT consult     Precautions / Restrictions Precautions Precautions: Fall Restrictions Weight Bearing Restrictions: No       Mobility  Bed Mobility               General bed mobility comments: deferred, pt seated EOB upon entry  Transfers Overall transfer level: Needs assistance Equipment used: Rolling walker (2 wheeled);2 person hand held assist Transfers: Sit to/from Stand Sit to Stand: Mod assist;Min assist;+2 physical assistance         General transfer comment: Mod A x2 for STS trial with RW (difficulty maintaining LUE grip on RW), Min A x2 for standing balance  Ambulation/Gait Ambulation/Gait assistance: Min assist;+2 physical assistance Gait Distance (Feet): 2 Feet Assistive device: 2 person hand held assist   Gait velocity: decreased   General Gait Details: to chair, extended time needed to coordinate LLE movements  Stairs            Wheelchair Mobility    Modified Rankin (Stroke Patients Only) Modified Rankin (Stroke Patients Only) Pre-Morbid Rankin Score: No symptoms Modified Rankin: Severe disability     Balance Overall balance assessment: Needs assistance Sitting-balance support: Single extremity supported;Feet supported Sitting balance-Leahy Scale: Fair       Standing balance-Leahy Scale: Poor Standing balance comment: required BUE support                             Pertinent Vitals/Pain Pain Assessment: 0-10 Pain Score: 6  Pain Location: RLE Pain Descriptors / Indicators: Aching Pain Intervention(s): Limited activity within patient's tolerance;Repositioned    Home Living Family/patient expects to be discharged to:: Private residence Living Arrangements: Spouse/significant other;Children(son) Available Help at Discharge: Family;Available 24 hours/day Type of Home: House Home Access: Stairs to enter Entrance Stairs-Rails: Can reach both;Right;Left Entrance Stairs-Number of Steps: 5-7 Home Layout: One level Home  Equipment: [place] Mor - single point      Prior Function Level of Independence: Independent with assistive device(s)          Comments: Ambulating with SPC at baseline, indep with ADL, 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[employer]; only 1 fall on Thursday prior to hospitalization     Hand Dominance   Dominant Hand: Right    Extremity/Trunk Assessment   Upper Extremity Assessment Upper Extremity Assessment: LUE deficits/detail;Defer to OT evaluation;Generalized weakness RUE Deficits / Details: WFLs LUE Deficits / Details: grossly 3-/5 shoulder flexion, elbow flex/ext 4-/5, grip 4/5, decreased finger to nose, thumb opposition testing; pt denies sensory deficits LUE Coordination: decreased fine motor;decreased gross motor    Lower Extremity Assessment Lower Extremity Assessment: RLE deficits/detail;LLE deficits/detail RLE Deficits / Details: WFLs 5/5 LLE Deficits / Details: grossly at least 4/5 except DF/PF weaker, unable to complete LAQ independently denies sensory deficits, impaired heel to shin LLE Sensation: WNL LLE Coordination: decreased fine motor;decreased gross motor    Cervical / Trunk Assessment Cervical / Trunk Assessment: Normal  Communication   Communication: Prefers language other than English;Interpreter utilized;Other (comment)(Spanish)  Cognition Arousal/Alertness: Awake/alert Behavior During Therapy: WFL for tasks assessed/performed Overall Cognitive Status: Within Functional Limits for tasks assessed                                        General Comments      Exercises Other Exercises Other Exercises: Pt instructed in functional transfer training with OT/PT, during attempts with RW, therapist facilitated mod-max cues for weight bearing evenly through LE and use of RW. Other Exercises: Instructed to perform leg exercises several times a day, pt verbalized understanding Other Exercises: LAQ AAROM and then AROM attempted, improved muscle activation noted with repetition   Assessment/Plan    PT Assessment Patient needs continued PT services  PT Problem List Decreased  strength;Decreased mobility;Decreased safety awareness;Decreased range of motion;Decreased coordination;Decreased knowledge of precautions;Decreased activity tolerance;Decreased balance;Decreased knowledge of use of DME       PT Treatment Interventions DME instruction;Therapeutic exercise;Gait training;Balance training;Stair training;Neuromuscular re-education;Functional mobility training;Therapeutic activities;Patient/family education    PT Goals (Current goals can be found in the Care Plan section)  Acute Rehab PT Goals Patient Stated Goal: return to PLOF PT Goal Formulation: With patient Time For Goal Achievement: 07/03/19 Potential to Achieve Goals: Good    Frequency 7X/week   Barriers to discharge        Co-evaluation   Reason for Co-Treatment: To address functional/ADL transfers;For patient/therapist safety PT goals addressed during session: Mobility/safety with mobility;Balance;Proper use of DME OT goals addressed during session: ADL's and self-care;Proper use of Adaptive equipment and DME       AM-PAC PT "6 Clicks" Mobility  Outcome Measure Help needed turning from your back to your side while in a flat bed without using bedrails?: A Little Help needed moving from lying on your back to sitting on the side of a flat bed without using bedrails?: A Little Help needed moving to and from a bed to a chair (including a wheelchair)?: A Lot Help needed standing up from a chair using your arms (e.g., wheelchair or bedside chair)?: A Lot Help needed to walk in hospital room?: A Lot Help needed climbing 3-5 steps with a railing? : A Lot 6 Click Score: 14    End of Session Equipment Utilized During Treatment: Gait belt Activity Tolerance: Patient tolerated treatment well  Patient left: in chair;with chair alarm set;with call bell/phone within reach;with family/visitor present Nurse Communication: Mobility status PT Visit Diagnosis: Other abnormalities of gait and mobility  (R26.89);Muscle weakness (generalized) (M62.81);Hemiplegia and hemiparesis;Difficulty in walking, not elsewhere classified (R26.2) Hemiplegia - Right/Left: Left Hemiplegia - dominant/non-dominant: Non-dominant Hemiplegia - caused by: Cerebral infarction    Time: 5396-7289 PT Time Calculation (min) (ACUTE ONLY): 26 min   Charges:   PT Evaluation $PT Eval Low Complexity: 1 Low PT Treatments $Therapeutic Activity: 8-22 mins        Lieutenant Diego PT, DPT 4:13 PM,06/19/19

## 2019-06-19 NOTE — Progress Notes (Signed)
SLP Cancellation Note  Patient Details Name: Juan Hughes MRN: [id number] DOB: Nov 29, 1955   Cancelled treatment:       Reason Eval/Treat Not Completed: SLP screened, no needs identified, will sign off(chart reviewed; pt screened via use of Interpreter). Via [employer]; family/pt report, pt denied any difficulty swallowing and is currently on a regular diet; tolerates swallowing pills w/ water per NSG. Pt also denied any deficits w/ his speech; he conversed at conversational level in [place] w/ MD as well w/out deficits noted; pt and family denied any speech-language deficits.  No further skilled ST services indicated as pt appears at his baseline. Pt agreed. NSG to reconsult if any change in status.     Juan Som, MS, CCC-SLP Amour Trigg 06/19/2019, 2:58 PM

## 2019-06-20 DIAGNOSIS — R531 Weakness: Secondary | ICD-10-CM

## 2019-06-20 DIAGNOSIS — I10 Essential (primary) hypertension: Secondary | ICD-10-CM

## 2019-06-20 DIAGNOSIS — I5020 Unspecified systolic (congestive) heart failure: Secondary | ICD-10-CM

## 2019-06-20 DIAGNOSIS — Z720 Tobacco use: Secondary | ICD-10-CM

## 2019-06-20 LAB — COMPREHENSIVE METABOLIC PANEL
ALT: 11 U/L (ref 0–44)
AST: 15 U/L (ref 15–41)
Albumin: 3.8 g/dL (ref 3.5–5.0)
Alkaline Phosphatase: 61 U/L (ref 38–126)
Anion gap: 10 (ref 5–15)
BUN: 21 mg/dL (ref 8–23)
CO2: 24 mmol/L (ref 22–32)
Calcium: 9 mg/dL (ref 8.9–10.3)
Chloride: 110 mmol/L (ref 98–111)
Creatinine, Ser: 1.05 mg/dL (ref 0.61–1.24)
GFR calc Af Amer: >60 mL/min (ref >60)
GFR calc non Af Amer: >60 mL/min (ref >60)
Glucose, Bld: 92 mg/dL (ref 70–99)
Potassium: 3.2 mmol/L — ABNORMAL LOW (ref 3.5–5.1)
Sodium: 144 mmol/L (ref 135–145)
Total Bilirubin: 1 mg/dL (ref 0.3–1.2)
Total Protein: 6.4 g/dL — ABNORMAL LOW (ref 6.5–8.1)

## 2019-06-20 LAB — CBC
HCT: 44.3 % (ref 39.0–52.0)
Hemoglobin: 14.2 g/dL (ref 13.0–17.0)
MCH: 28 pg (ref 26.0–34.0)
MCHC: 32.1 g/dL (ref 30.0–36.0)
MCV: 87.4 fL (ref 80.0–100.0)
Platelets: 196 10*3/uL (ref 150–400)
RBC: 5.07 MIL/uL (ref 4.22–5.81)
RDW: 13.7 % (ref 11.5–15.5)
WBC: 6.5 10*3/uL (ref 4.0–10.5)
nRBC: 0 % (ref 0.0–0.2)

## 2019-06-20 MED ORDER — CARVEDILOL 3.125 MG PO TABS
6.2500 mg | ORAL_TABLET | Freq: Two times a day (BID) | ORAL | Status: DC
  Administered 2019-06-21 – 2019-06-22 (×4): 6.25 mg via ORAL
  Filled 2019-06-20 (×4): qty 2

## 2019-06-20 MED ORDER — METOPROLOL SUCCINATE ER 25 MG PO TB24
12.5000 mg | ORAL_TABLET | Freq: Every day | ORAL | Status: DC
  Administered 2019-06-20: 12.5 mg via ORAL
  Filled 2019-06-20: qty 1

## 2019-06-20 MED ORDER — SODIUM CHLORIDE 0.9% FLUSH
10.0000 mL | Freq: Two times a day (BID) | INTRAVENOUS | Status: DC
  Administered 2019-06-20 – 2019-06-23 (×6): 10 mL via INTRAVENOUS

## 2019-06-20 MED ORDER — LOSARTAN POTASSIUM 50 MG PO TABS: 50.0000 mg | ORAL_TABLET | Freq: Every day | ORAL | Status: DC

## 2019-06-20 MED ORDER — POTASSIUM CHLORIDE CRYS ER 20 MEQ PO TBCR
40.0000 meq | EXTENDED_RELEASE_TABLET | Freq: Once | ORAL | Status: AC
  Administered 2019-06-20: 40 meq via ORAL
  Filled 2019-06-20: qty 2

## 2019-06-20 MED ORDER — CARVEDILOL 3.125 MG PO TABS: 6.2500 mg | ORAL_TABLET | Freq: Two times a day (BID) | ORAL | Status: DC

## 2019-06-20 MED ORDER — HYDROCHLOROTHIAZIDE 12.5 MG PO CAPS
12.5000 mg | ORAL_CAPSULE | Freq: Every day | ORAL | Status: DC
  Administered 2019-06-20: 12.5 mg via ORAL
  Filled 2019-06-20: qty 1

## 2019-06-20 NOTE — Plan of Care (Signed)
  Problem: Coping: Goal: Will verbalize positive feelings about self Outcome: Progressing   Problem: Education: Goal: Knowledge of General Education information will improve Description: Including pain rating scale, medication(s)/side effects and non-pharmacologic comfort measures Outcome: Progressing   Problem: Health Behavior/Discharge Planning: Goal: Ability to manage health-related needs will improve Outcome: Progressing

## 2019-06-20 NOTE — Progress Notes (Signed)
Neuro checks unchanged throughout the shift from the 1049 daily assessment

## 2019-06-20 NOTE — Progress Notes (Signed)
Physical Therapy Treatment Patient Details Name: Juan Hughes MRN: [id number] DOB: Apr 17, 1956 Today's Date: 06/20/2019    History of Present Illness Pt is 64 yo male PMH of HTN presented to ED 1/28 for L sided weakness. MRI+ for small acute lacunar infarct in the posterior right coronaradiata. No associated hemorrhage or mass effect. 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 He was able to exhibit improved AROM and LE movement in bilateral LE this morning with seated exercise. He also exhibits improved LLE control during LE strengthening exercise. Patient instructed in advanced gait training, progressing to 50 feet with RW, min A +1. He does ambulate with flexed LLE knee during stance phase and exhibits increased LLE foot drag with prolonged ambulation. Patient required min VCs to increase  Ankle DF during swing and to increase heel/toe walking for better foot clearance. He also exhibits heavy UE weight bearing on RW for balance control. Patient fatigued after walking short distance. He was complaining of increased lateral RLE knee pain which he reports is chronic, RN informed. He would benefit from additional skilled PT intervention to improve strength, balance and gait safety; Currently plan remains appropriate.    Follow Up Recommendations  CIR     Equipment Recommendations  Other (comment)(TBD)    Recommendations for Other Services OT consult     Precautions / Restrictions Precautions Precautions: Fall Restrictions Weight Bearing Restrictions: No    Mobility  Bed Mobility Overal bed mobility: Modified Independent             General bed mobility comments: patient transitioned edge of bed mod I, requires increased time/effort but is able to do without assistance with good postioning and safety awareness.  Transfers Overall transfer  level: Needs assistance Equipment used: Rolling walker (2 wheeled);2 person hand held assist Transfers: Sit to/from Stand Sit to Stand: Min assist         General transfer comment: x1 rep, with cues for hand placement and forward weight shift. Patient does lean towards right side (strong side) during transfer due to LLE weakness.  Ambulation/Gait Ambulation/Gait assistance: Min assist Gait Distance (Feet): 50 Feet Assistive device: Rolling walker (2 wheeled) Gait Pattern/deviations: Step-through pattern;Decreased step length - right;Decreased step length - left;Decreased dorsiflexion - left;Decreased weight shift to left;Wide base of support Gait velocity: decreased   General Gait Details: Pt does exhibit increased LLE knee flexion during mid stance with decreased ankle DF during swing especially with prolonged distance due to fatigue. He uses UE weight bearing for balance control. Requires min A +1 for safety   Stairs             Wheelchair Mobility    Modified Rankin (Stroke Patients Only)       Balance Overall balance assessment: Needs assistance Sitting-balance support: No upper extremity supported;Feet supported Sitting balance-Leahy Scale: Fair     Standing balance support: During functional activity;Bilateral upper extremity supported Standing balance-Leahy Scale: Poor Standing balance comment: during ambulation with RW;                            Cognition Arousal/Alertness: Awake/alert Behavior During Therapy: WFL for tasks assessed/performed Overall Cognitive Status: Within Functional Limits for tasks assessed  Exercises Other Exercises Other Exercises: Pt instructed in seated LE strengthening exercise, edge of bed: hip flexion march x10 reps, LAQ x10 reps, ankle DF/PF x10 reps; Patient required min VCS for proper positioning including to avoid posterior lean for better trunk support.  Also required min VCS to increase ROM for better strengthening. He was able to achieve terminal knee extension without discomfort. Does report fatigue with increased repetitions.    General Comments        Pertinent Vitals/Pain Pain Assessment: 0-10 Pain Score: 3  Pain Location: RLE knee Pain Descriptors / Indicators: Aching Pain Intervention(s): Limited activity within patient's tolerance;Monitored during session;Repositioned;Patient requesting pain meds-RN notified    Home Living                      Prior Function            PT Goals (current goals can now be found in the care plan section) Acute Rehab PT Goals Patient Stated Goal: return to PLOF PT Goal Formulation: With patient Time For Goal Achievement: 07/03/19 Potential to Achieve Goals: Good Progress towards PT goals: Progressing toward goals    Frequency    7X/week      PT Plan      Co-evaluation              AM-PAC PT "6 Clicks" Mobility   Outcome Measure  Help needed turning from your back to your side while in a flat bed without using bedrails?: None Help needed moving from lying on your back to sitting on the side of a flat bed without using bedrails?: A Little Help needed moving to and from a bed to a chair (including a wheelchair)?: A Little Help needed standing up from a chair using your arms (e.g., wheelchair or bedside chair)?: A Little Help needed to walk in hospital room?: A Little Help needed climbing 3-5 steps with a railing? : A Lot 6 Click Score: 18    End of Session Equipment Utilized During Treatment: Gait belt Activity Tolerance: Patient tolerated treatment well Patient left: in chair;with chair alarm set;with call bell/phone within reach;with nursing/sitter in room Nurse Communication: Mobility status PT Visit Diagnosis: Other abnormalities of gait and mobility (R26.89);Muscle weakness (generalized) (M62.81);Hemiplegia and hemiparesis;Difficulty in walking, not  elsewhere classified (R26.2) Hemiplegia - Right/Left: Left Hemiplegia - dominant/non-dominant: Non-dominant Hemiplegia - caused by: Cerebral infarction     Time: 1021-1040 PT Time Calculation (min) (ACUTE ONLY): 19 min  Charges:  $Therapeutic Exercise: 8-22 mins                        Alfredia Desanctis PT, DPT 06/20/2019, 1:02 PM

## 2019-06-20 NOTE — Plan of Care (Signed)
  Problem: Education: Goal: Knowledge of disease or condition will improve Outcome: Progressing Goal: Knowledge of secondary prevention will improve Outcome: Progressing Goal: Knowledge of patient specific risk factors addressed and post discharge goals established will improve Outcome: Progressing   Problem: Coping: Goal: Will verbalize positive feelings about self Outcome: Progressing Goal: Will identify appropriate support needs Outcome: Progressing   Problem: Health Behavior/Discharge Planning: Goal: Ability to manage health-related needs will improve Outcome: Progressing   Problem: Self-Care: Goal: Ability to participate in self-care as condition permits will improve Outcome: Progressing Goal: Verbalization of feelings and concerns over difficulty with self-care will improve Outcome: Progressing Goal: Ability to communicate needs accurately will improve Outcome: Progressing   Problem: Nutrition: Goal: Risk of aspiration will decrease Outcome: Progressing Goal: Dietary intake will improve Outcome: Progressing   Problem: Ischemic Stroke/TIA Tissue Perfusion: Goal: Complications of ischemic stroke/TIA will be minimized Outcome: Progressing   Problem: Education: Goal: Knowledge of General Education information will improve Description: Including pain rating scale, medication(s)/side effects and non-pharmacologic comfort measures Outcome: Progressing   Problem: Health Behavior/Discharge Planning: Goal: Ability to manage health-related needs will improve Outcome: Progressing   Problem: Clinical Measurements: Goal: Ability to maintain clinical measurements within normal limits will improve Outcome: Progressing Goal: Will remain free from infection Outcome: Progressing Goal: Diagnostic test results will improve Outcome: Progressing Goal: Respiratory complications will improve Outcome: Progressing Goal: Cardiovascular complication will be avoided Outcome:  Progressing   Problem: Activity: Goal: Risk for activity intolerance will decrease Outcome: Progressing   Problem: Nutrition: Goal: Adequate nutrition will be maintained Outcome: Progressing   Problem: Coping: Goal: Level of anxiety will decrease Outcome: Progressing   Problem: Elimination: Goal: Will not experience complications related to bowel motility Outcome: Progressing Goal: Will not experience complications related to urinary retention Outcome: Progressing   Problem: Pain Managment: Goal: General experience of comfort will improve Outcome: Progressing   Problem: Safety: Goal: Ability to remain free from injury will improve Outcome: Progressing   Problem: Skin Integrity: Goal: Risk for impaired skin integrity will decrease Outcome: Progressing   

## 2019-06-20 NOTE — Consult Note (Signed)
Cardiology Consultation:   Patient ID: Juan Hughes MRN: [id number]; DOB: 1955/09/27  Admit date: 06/18/2019 Date of Consult: 06/20/2019  Primary Care Provider: Patient, No Pcp Per Primary Cardiologist: New- [facility] rounding Primary Electrophysiologist:  None    Patient Profile:   Juan Hughes is a 64 y.o. male with a hx of hypertension, smoking who is being seen today for the evaluation of reduced ejection fraction at the request of Dr. Maryfrances Bunnell.  History of Present Illness:   Juan Hughes is a 64 year old gentleman with history of hypertension who presented to the hospital due to left-sided weakness, left arm and left leg.,  He did not have any chest pain, shortness of breath, fever or chills.  In the emergency room on presentation, head CT was unremarkable.  Stroke work-up being performed by primary team and neurology.  Echocardiogram was obtained which showed mild to moderately reduced ejection fraction with EF of 40 to 45%.  Patient was about to work with physical therapy during my history.  Still has left arm and left leg weakness.  Heart Pathway Score:     Past Medical History:  Diagnosis Date  . Hypertension     History reviewed. No pertinent surgical history.   Home Medications:  Prior to Admission medications   Not on File    Inpatient Medications: Scheduled Meds: .  stroke: mapping our early stages of recovery book   Does not apply Once  . aspirin  300 mg Rectal Daily   Or  . aspirin EC  325 mg Oral Daily  . atorvastatin  80 mg Oral q1800  . enoxaparin (LOVENOX) injection  40 mg Subcutaneous Q24H  . feeding supplement (ENSURE ENLIVE)  237 mL Oral BID BM  . [START ON 06/21/2019] losartan  50 mg Oral Daily  . metoprolol succinate  12.5 mg Oral Daily  . multivitamin with minerals  1 tablet Oral Daily  . nicotine  21 mg Transdermal Daily  . sodium chloride flush  3 mL Intravenous Once   Continuous Infusions:  PRN  Meds: acetaminophen **OR** acetaminophen (TYLENOL) oral liquid 160 mg/5 mL **OR** acetaminophen, labetalol, oxyCODONE-acetaminophen, senna-docusate  Allergies:   No Known Allergies  Social History:   Social History   Socioeconomic History  . Marital status: Married    Spouse name: Not on file  . Number of children: Not on file  . Years of education: Not on file  . Highest education level: Not on file  Occupational History  . Not on file  Tobacco Use  . Smoking status: Current Every Day Smoker    Types: Cigars  . Smokeless tobacco: Never Used  Substance and Sexual Activity  . Alcohol use: Never  . Drug use: Never  . Sexual activity: Not on file  Other Topics Concern  . Not on file  Social History Narrative  . Not on file   Social Determinants of Health   Financial Resource Strain:   . Difficulty of Paying Living Expenses: Not on file  Food Insecurity:   . Worried About Programme researcher, broadcasting/film/video in the Last Year: Not on file  . Ran Out of Food in the Last Year: Not on file  Transportation Needs:   . Lack of Transportation (Medical): Not on file  . Lack of Transportation (Non-Medical): Not on file  Physical Activity:   . Days of Exercise per Week: Not on file  . Minutes of Exercise per Session: Not on file  Stress:   . Feeling of Stress :  Not on file  Social Connections:   . Frequency of Communication with Friends and Family: Not on file  . Frequency of Social Gatherings with Friends and Family: Not on file  . Attends Religious Services: Not on file  . Active Member of Clubs or Organizations: Not on file  . Attends Banker Meetings: Not on file  . Marital Status: Not on file  Intimate Partner Violence:   . Fear of Current or Ex-Partner: Not on file  . Emotionally Abused: Not on file  . Physically Abused: Not on file  . Sexually Abused: Not on file    Family History:    Family History  Problem Relation Age of Onset  . Hypertension Mother      ROS:   Please see the history of present illness.   All other ROS reviewed and negative.     Physical Exam/Data:   Vitals:   06/19/19 2015 06/19/19 2201 06/20/19 0042 06/20/19 1100  BP: (!) 190/93 (!) 186/92 (!) 179/88 (!) 178/110  Pulse:   66 68  Resp:   16 16  Temp:   98.6 F (37 C) 98 F (36.7 C)  TempSrc:   Oral Oral  SpO2:   97% 100%  Weight:      Height:        Intake/Output Summary (Last 24 hours) at 06/20/2019 1127 Last data filed at 06/20/2019 1040 Gross per 24 hour  Intake --  Output 750 ml  Net -750 ml   Last 3 Weights 06/18/2019  Weight (lbs) 150 lb  Weight (kg) 68.04 kg     Body mass index is 29.29 kg/m.  General:  Well nourished, well developed, in no acute distress HEENT: normal Lymph: no adenopathy Neck: no JVD Endocrine:  No thryomegaly Vascular: No carotid bruits; FA pulses 2+ bilaterally without bruits  Cardiac:  normal S1, S2; RRR; no murmur  Lungs:  clear to auscultation bilaterally, no wheezing, rhonchi or rales  Abd: soft, nontender, no hepatomegaly  Ext: no edema Musculoskeletal: Weakness in the left arm and left leg noted. Skin: warm and dry  Neuro: Left arm and left leg weakness noted. Psych:  Normal affect   EKG:  The EKG was personally reviewed and demonstrates: Sinus rhythm, LVH per voltage criteria   Relevant CV Studies: TTE 06/18/2018 1. Left ventricular ejection fraction, by visual estimation, is 40 to  45%. The left ventricle has moderately decreased function. There is  severely increased left ventricular hypertrophy.  2. Left ventricular diastolic parameters are consistent with Grade I  diastolic dysfunction (impaired relaxation).  3. Small to moderate myocardial infarction.  4. The left ventricle has no regional wall motion abnormalities.  5. Global right ventricle has moderately reduced systolic function.The  right ventricular size is moderately enlarged. Mildly increased right  ventricular wall thickness.  6. Left atrial  size was moderately dilated.  7. Right atrial size was moderately dilated.  8. The mitral valve is normal in structure. Mild mitral valve  regurgitation. No evidence of mitral stenosis.  9. The tricuspid valve is normal in structure.  10. The tricuspid valve is normal in structure. Tricuspid valve  regurgitation is mild.  11. The aortic valve is normal in structure. Aortic valve regurgitation is  not visualized. Mild aortic valve sclerosis without stenosis.  12. The pulmonic valve was normal in structure. Pulmonic valve  regurgitation is not visualized.  13. Mildly elevated pulmonary artery systolic pressure.  14. The inferior vena cava is normal in size with  greater than 50%  respiratory variability, suggesting right atrial pressure of 3 mmHg.   Laboratory Data:  High Sensitivity Troponin:  No results for input(s): TROPONINIHS in the last 720 hours.   Chemistry Recent Labs  Lab 06/18/19 1129 06/20/19 0629  NA 142 144  K 2.9* 3.2*  CL 105 110  CO2 27 24  GLUCOSE 106* 92  BUN 19 21  CREATININE 1.05 1.05  CALCIUM 9.5 9.0  GFRNONAA >60 >60  GFRAA >60 >60  ANIONGAP 10 10    Recent Labs  Lab 06/18/19 1129 06/20/19 0629  PROT 7.4 6.4*  ALBUMIN 4.1 3.8  AST 16 15  ALT 12 11  ALKPHOS 75 61  BILITOT 0.9 1.0   Hematology Recent Labs  Lab 06/18/19 1129 06/20/19 0629  WBC 6.7 6.5  RBC 5.60 5.07  HGB 15.7 14.2  HCT 48.2 44.3  MCV 86.1 87.4  MCH 28.0 28.0  MCHC 32.6 32.1  RDW 13.3 13.7  PLT 209 196   BNPNo results for input(s): BNP, PROBNP in the last 168 hours.  DDimer No results for input(s): DDIMER in the last 168 hours.   Radiology/Studies:  CT HEAD WO CONTRAST  Result Date: 06/18/2019 CLINICAL DATA:  Left leg and arm weakness.  Subsequent fall EXAM: CT HEAD WITHOUT CONTRAST TECHNIQUE: Contiguous axial images were obtained from the base of the skull through the vertex without intravenous contrast. COMPARISON:  None. FINDINGS: Brain: Chronic small vessel  ischemic gliosis in the cerebral white matter, fairly extensive. No evidence of acute infarct, hemorrhage, hydrocephalus, or mass. Vascular: Vertebrobasilar tortuosity.  No hyperdense vessel. Skull: Negative Sinuses/Orbits: Negative IMPRESSION: 1. No acute finding. 2. Extensive chronic small vessel ischemia. Electronically Signed   By: Marnee Spring M.D.   On: 06/18/2019 11:44   MR ANGIO HEAD WO CONTRAST  Result Date: 06/18/2019 CLINICAL DATA:  64 year old male with left side weakness, fall. Hypertensive. EXAM: MRI HEAD WITHOUT CONTRAST MRA HEAD WITHOUT CONTRAST TECHNIQUE: Multiplanar, multiecho pulse sequences of the brain and surrounding structures were obtained without intravenous contrast. Angiographic images of the head were obtained using MRA technique without contrast. COMPARISON:  Head CT earlier today. FINDINGS: MRI HEAD FINDINGS Brain: Curvilinear area of restricted diffusion in the posterior right corona radiata tracking toward the posterior lentiform, in an area of about 11 millimeters (series 8, image 55). Faint T2 and FLAIR hyperintensity. No associated hemorrhage or mass effect. No other restricted diffusion. Patchy and confluent bilateral cerebral white matter T2 and FLAIR hyperintensity, also visible by CT today. Bilateral temporal lobe involvement. No cortical encephalomalacia or chronic cerebral blood products. Outside of the acute finding the deep gray nuclei are within normal limits. Brainstem and cerebellum appear within normal limits. No midline shift, mass effect, evidence of mass lesion, ventriculomegaly, extra-axial collection or acute intracranial hemorrhage. Cervicomedullary junction and pituitary are within normal limits. Vascular: Major intracranial vascular flow voids are preserved, with generalized intracranial artery tortuosity. Basilar artery ectasia. The distal left vertebral artery appears dominant. Skull and upper cervical spine: Negative visible cervical spine. Normal bone  marrow signal. Sinuses/Orbits: Negative orbits. Paranasal sinuses are clear. Other: Mild left mastoid effusion. Trace retained secretions in the nasopharynx. Right mastoids are clear. MRA HEAD FINDINGS Antegrade flow in the posterior circulation with dominant and dolichoectatic distal left vertebral artery. Normal left PICA. The right V4 segment is diminutive and might be supplied in a retrograde fashion, uncertain. No visible right vertebral artery flow void in the neck on sagittal T1 brain imaging makes this more likely.  The left vertebral supplies the basilar without stenosis. Ectatic basilar artery. Widely patent SCA and PCA origins. Posterior communicating arteries are diminutive or absent. The left PCA branches are within normal limits. The right P1 segment is tortuous but otherwise normal. There is right P2 segment signal loss which may be related to a downward trajectory of the vessel, uncertain. Distal right PCA branch enhancement seems preserved. Antegrade flow in both ICA siphons. Tortuous carotid segments. No siphon stenosis. Patent carotid termini. Normal MCA and ACA origins. Visible ACA branches are within normal limits. MCA M1 segments and bifurcations are patent without stenosis. Visible bilateral MCA branches are within normal limits. IMPRESSION: 1. Small acute lacunar infarct in the posterior right corona radiata. No associated hemorrhage or mass effect. 2. Underlying advanced bilateral cerebral white matter signal changes, nonspecific but favored due to chronic small vessel disease. 3. 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 And: - possible occlusion of the cervical right vertebral artery, supplied in a retrograde fashion from the vertebrobasilar junction. This is unrelated to #1. - questionable stenosis of the right PCA P2 segment, favor artifact instead. Electronically Signed   By: Odessa Fleming M.D.   On: 06/18/2019 18:31   MR BRAIN WO CONTRAST  Result  Date: 06/18/2019 CLINICAL DATA:  64 year old male with left side weakness, fall. Hypertensive. EXAM: MRI HEAD WITHOUT CONTRAST MRA HEAD WITHOUT CONTRAST TECHNIQUE: Multiplanar, multiecho pulse sequences of the brain and surrounding structures were obtained without intravenous contrast. Angiographic images of the head were obtained using MRA technique without contrast. COMPARISON:  Head CT earlier today. FINDINGS: MRI HEAD FINDINGS Brain: Curvilinear area of restricted diffusion in the posterior right corona radiata tracking toward the posterior lentiform, in an area of about 11 millimeters (series 8, image 55). Faint T2 and FLAIR hyperintensity. No associated hemorrhage or mass effect. No other restricted diffusion. Patchy and confluent bilateral cerebral white matter T2 and FLAIR hyperintensity, also visible by CT today. Bilateral temporal lobe involvement. No cortical encephalomalacia or chronic cerebral blood products. Outside of the acute finding the deep gray nuclei are within normal limits. Brainstem and cerebellum appear within normal limits. No midline shift, mass effect, evidence of mass lesion, ventriculomegaly, extra-axial collection or acute intracranial hemorrhage. Cervicomedullary junction and pituitary are within normal limits. Vascular: Major intracranial vascular flow voids are preserved, with generalized intracranial artery tortuosity. Basilar artery ectasia. The distal left vertebral artery appears dominant. Skull and upper cervical spine: Negative visible cervical spine. Normal bone marrow signal. Sinuses/Orbits: Negative orbits. Paranasal sinuses are clear. Other: Mild left mastoid effusion. Trace retained secretions in the nasopharynx. Right mastoids are clear. MRA HEAD FINDINGS Antegrade flow in the posterior circulation with dominant and dolichoectatic distal left vertebral artery. Normal left PICA. The right V4 segment is diminutive and might be supplied in a retrograde fashion, uncertain.  No visible right vertebral artery flow void in the neck on sagittal T1 brain imaging makes this more likely. The left vertebral supplies the basilar without stenosis. Ectatic basilar artery. Widely patent SCA and PCA origins. Posterior communicating arteries are diminutive or absent. The left PCA branches are within normal limits. The right P1 segment is tortuous but otherwise normal. There is right P2 segment signal loss which may be related to a downward trajectory of the vessel, uncertain. Distal right PCA branch enhancement seems preserved. Antegrade flow in both ICA siphons. Tortuous carotid segments. No siphon stenosis. Patent carotid termini. Normal MCA and ACA origins. Visible ACA branches are within normal limits. MCA M1 segments and  bifurcations are patent without stenosis. Visible bilateral MCA branches are within normal limits. IMPRESSION: 1. Small acute lacunar infarct in the posterior right corona radiata. No associated hemorrhage or mass effect. 2. Underlying advanced bilateral cerebral white matter signal changes, nonspecific but favored due to chronic small vessel disease. 3. 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 And: - possible occlusion of the cervical right vertebral artery, supplied in a retrograde fashion from the vertebrobasilar junction. This is unrelated to #1. - questionable stenosis of the right PCA P2 segment, favor artifact instead. Electronically Signed   By: Genevie Ann M.D.   On: 06/18/2019 18:31   US Carotid Bilateral (at [facility] and [facility] only)  Result Date: 06/19/2019 CLINICAL DATA:  Stroke.  History of hypertension and smoking. EXAM: BILATERAL CAROTID DUPLEX ULTRASOUND TECHNIQUE: Pearline Cables scale imaging, color Doppler and duplex ultrasound were performed of bilateral carotid and vertebral arteries in the neck. COMPARISON:  MRI/MRA - earlier same day FINDINGS: Criteria: Quantification of carotid stenosis is based on velocity parameters that correlate  the residual internal carotid diameter with NASCET-based stenosis levels, using the diameter of the distal internal carotid lumen as the denominator for stenosis measurement. The following velocity measurements were obtained: RIGHT ICA: 74/50 cm/sec CCA: 16/10 cm/sec SYSTOLIC ICA/CCA RATIO:  0.9 ECA: 62 cm/sec LEFT ICA: 67/18 cm/sec CCA: 96/04 cm/sec SYSTOLIC ICA/CCA RATIO:  0.8 ECA: 82 cm/sec RIGHT CAROTID ARTERY: There is a minimal amount of eccentric echogenic plaque involving the proximal aspect of the right internal carotid artery (image 22, not resulting in elevated peak systolic velocities within the interrogated course the right internal carotid artery to suggest a hemodynamically significant stenosis. RIGHT VERTEBRAL ARTERY:  Not visualized LEFT CAROTID ARTERY: There is a minimal amount of intimal thickening involving the distal aspect the left common carotid artery, extending to involve the left carotid bulb, origin and proximal aspects of the left internal carotid artery, not resulting in hemodynamically significant stenosis within the interrogated course the left internal carotid artery to suggest a hemodynamically significant stenosis. LEFT VERTEBRAL ARTERY:  Antegrade flow IMPRESSION: 1. Minimal amount of bilateral intimal thickening/atherosclerotic plaque, right greater than left, not resulting in a hemodynamically significant stenosis within either internal carotid artery. 2. 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 Clinical correlation is advised. 3. Antegrade flow within the left vertebral artery. Electronically Signed   By: Sandi Mariscal M.D.   On: 06/19/2019 07:29   ECHOCARDIOGRAM COMPLETE  Result Date: 06/19/2019   ECHOCARDIOGRAM REPORT   Patient Name:   AB LEAMING CERVANTES Date of Exam:  06/19/2019 Medical Rec #:  [id number]              Height:       60.0 in Accession #:    [id number]             Weight:       150.0 lb Date of Birth:  1955/11/29              BSA:          1.65 m Patient Age:    77 years               BP:           192/102 mmHg Patient Gender: M  HR:           71 bpm. Exam Location:  [facility] Procedure: 2D Echo, Cardiac Doppler and Color Doppler Indications:     Stroke 434.91  History:         Patient has no prior history of Echocardiogram examinations.                  Risk Factors:Hypertension.  Sonographer:     Cristela Blue RDCS (AE) Referring Phys:  [id number] Brien Few NIU Diagnosing Phys: Adrian Blackwater MD IMPRESSIONS  1. Left ventricular ejection fraction, by visual estimation, is 40 to 45%. The left ventricle has moderately decreased function. There is severely increased left ventricular hypertrophy.  2. Left ventricular diastolic parameters are consistent with Grade I diastolic dysfunction (impaired relaxation).  3. Small to moderate myocardial infarction.  4. The left ventricle has no regional wall motion abnormalities.  5. Global right ventricle has moderately reduced systolic function.The right ventricular size is moderately enlarged. Mildly increased right ventricular wall thickness.  6. Left atrial size was moderately dilated.  7. Right atrial size was moderately dilated.  8. The mitral valve is normal in structure. Mild mitral valve regurgitation. No evidence of mitral stenosis.  9. The tricuspid valve is normal in structure. 10. The tricuspid valve is normal in structure. Tricuspid valve regurgitation is mild. 11. The aortic valve is normal in structure. Aortic valve regurgitation is not visualized. Mild aortic valve sclerosis without stenosis. 12. The pulmonic valve was normal in structure. Pulmonic valve regurgitation is not visualized. 13. Mildly elevated pulmonary artery systolic pressure. 14. The inferior vena cava is normal in size with greater than 50%  respiratory variability, suggesting right atrial pressure of 3 mmHg. FINDINGS  Left Ventricle: Left ventricular ejection fraction, by visual estimation, is 40 to 45%. The left ventricle has moderately decreased function. The left ventricle has no regional wall motion abnormalities. There is severely increased left ventricular hypertrophy. Concentric left ventricular hypertrophy. Left ventricular diastolic parameters are consistent with Grade I diastolic dysfunction (impaired relaxation). Normal left atrial pressure. There is evidence of a small to moderate myocardial infarction. Right Ventricle: The right ventricular size is moderately enlarged. Mildly increased right ventricular wall thickness. Global RV systolic function is has moderately reduced systolic function. The tricuspid regurgitant velocity is 2.44 m/s, and with an assumed right atrial pressure of 10 mmHg, the estimated right ventricular systolic pressure is mildly elevated at 33.8 mmHg. Left Atrium: Left atrial size was moderately dilated. Right Atrium: Right atrial size was moderately dilated Pericardium: There is no evidence of pericardial effusion. Mitral Valve: The mitral valve is normal in structure. Mild mitral valve regurgitation. No evidence of mitral valve stenosis by observation. Tricuspid Valve: The tricuspid valve is normal in structure. Tricuspid valve regurgitation is mild. Aortic Valve: The aortic valve is normal in structure. Aortic valve regurgitation is not visualized. Mild aortic valve sclerosis is present, with no evidence of aortic valve stenosis. Aortic valve mean gradient measures 7.0 mmHg. Aortic valve peak gradient measures 11.9 mmHg. Aortic valve area, by VTI measures 2.12 cm. Pulmonic Valve: The pulmonic valve was normal in structure. Pulmonic valve regurgitation is not visualized. Pulmonic regurgitation is not visualized. Aorta: The aortic root, ascending aorta and aortic arch are all structurally normal, with no evidence of  dilitation or obstruction. Venous: The inferior vena cava is normal in size with greater than 50% respiratory variability, suggesting right atrial pressure of 3 mmHg. IAS/Shunts: No atrial level shunt detected by color flow Doppler. There is  no evidence of a patent foramen ovale. No ventricular septal defect is seen or detected. There is no evidence of an atrial septal defect.  LEFT VENTRICLE PLAX 2D LVIDd:         5.78 cm       Diastology LVIDs:         4.66 cm       LV e' lateral:   4.03 cm/s LV PW:         1.21 cm       LV E/e' lateral: 14.3 LV IVS:        1.12 cm       LV e' medial:    3.59 cm/s LVOT diam:     2.20 cm       LV E/e' medial:  16.1 LV SV:         65 ml LV SV Index:   37.70 LVOT Area:     3.80 cm  LV Volumes (MOD) LV area d, A2C:    31.20 cm LV area d, A4C:    34.30 cm LV area s, A2C:    19.60 cm LV area s, A4C:    24.40 cm LV major d, A2C:   7.58 cm LV major d, A4C:   7.57 cm LV major s, A2C:   6.28 cm LV major s, A4C:   6.82 cm LV vol d, MOD A2C: 107.0 ml LV vol d, MOD A4C: 128.0 ml LV vol s, MOD A2C: 52.2 ml LV vol s, MOD A4C: 73.1 ml LV SV MOD A2C:     54.8 ml LV SV MOD A4C:     128.0 ml LV SV MOD BP:      52.5 ml RIGHT VENTRICLE RV Basal diam:  2.92 cm RV S prime:     15.80 cm/s TAPSE (M-mode): 3.7 cm LEFT ATRIUM             Index       RIGHT ATRIUM           Index LA diam:        4.40 cm 2.66 cm/m  RA Area:     14.30 cm LA Vol (A2C):   78.8 ml 47.71 ml/m RA Volume:   27.40 ml  16.59 ml/m LA Vol (A4C):   60.4 ml 36.57 ml/m LA Biplane Vol: 75.5 ml 45.71 ml/m  AORTIC VALVE                    PULMONIC VALVE AV Area (Vmax):    1.79 cm     RVOT Peak grad: 2 mmHg AV Area (Vmean):   1.80 cm AV Area (VTI):     2.12 cm AV Vmax:           172.50 cm/s AV Vmean:          121.500 cm/s AV VTI:            0.362 m AV Peak Grad:      11.9 mmHg AV Mean Grad:      7.0 mmHg LVOT Vmax:         81.30 cm/s LVOT Vmean:        57.500 cm/s LVOT VTI:          0.202 m LVOT/AV VTI ratio: 0.56  AORTA Ao Root  diam: 3.40 cm MITRAL VALVE                         TRICUSPID VALVE  MV Area (PHT): 2.00 cm              TR Peak grad:   23.8 mmHg MV PHT:        110.20 msec           TR Vmax:        244.00 cm/s MV Decel Time: 380 msec MV E velocity: 57.70 cm/s  103 cm/s  SHUNTS MV A velocity: 111.00 cm/s 70.3 cm/s Systemic VTI:  0.20 m MV E/A ratio:  0.52        1.5       Systemic Diam: 2.20 cm  Adrian Blackwater MD Electronically signed by Adrian Blackwater MD Signature Date/Time: 06/19/2019/4:20:22 PM    Final    {   Assessment and Plan:   1. Mild to moderately reduced ejection fraction, EF 40 to 45% -Heart rate 66. -Started Toprol-XL 12.5 mg daily, (low normal heart rate). -Increase losartan to 50 mg daily.  Stop amlodipine. -Ischemic work-up (lexiscan) can be performed in the future may be as outpatient.  It is very possible his reduced ejection fraction is secondary to long history of uncontrolled blood pressure.  2.  Hypertension -Losartan, Toprol XL. -Further titration as per clinical course and neurology Recs -He may need higher blood pressure/permissive hypertension due to recent CVA.  3.  CVA -On aspirin, statin. -Regimen as per neurology and primary team.  4.  Smoking -On nicotine patch -Cessation recommended  Signed, Debbe Odea, MD  06/20/2019 11:27 AM

## 2019-06-20 NOTE — Progress Notes (Signed)
PROGRESS NOTE    Juan Hughes  SJG:[id number] DOB: 1956/05/11 DOA: 06/18/2019 PCP: Patient, No Pcp Per      Brief Narrative:  Juan Hughes is a 64 y.o. M with hx HTN untreated who presented with acute left hemiparesis.  Around 2 AM the morning of admission, the patient noticed left-sided weakness in the arm and leg without slurred speech or facial droop.  Presented to the ER around 11 AM, still with left hemiparesis.  Code stroke called, CT head unremarkable, neurology were consulted.      Assessment & Plan:  Stroke Patient admitted with new left hemiparesis.  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  The initial read suggested a regional wall motion abnormality, cardiology today do not feel this is the case.  No evidence of congestive heart failure. -Start beta-blocker -Continue losartan -Stop amlodipine -Start diuretic -Ischemic work-up as an outpatient   Hypertensive urgency Initially were allowing permissive hypertension.  MRI showed small vessel disease due to hypertension.  Discussed with neurology. -Continue losartan -Start beta-blocker and diuretic -Use labetalol as needed for severe range pressures  Hypokalemia Magnesium normal -Repeat potassium           Disposition: The patient was admitted with  acute left hemiparesis, found to have new stroke.  In addition he was noted to have severe range hypertension, requiring active titration of medicines, as well as new reduced ejection fraction.  At baseline the patient is independent still works.  At present he has left hemiparesis and is having difficulty standing.  We will continue rehabilitation, and work towards placement.       MDM: The below labs and imaging reports reviewed and summarized above.  Medication management as above.     DVT prophylaxis: Lovenox Code Status: FULL Family Communication:      Consultants:   Neurology  Procedures:   1/28 MRI brain / MRA head -- right corona radiata infarction, possible occlusion of the cervical right vertebral artery  1/29 US carotid -- no blockages  1/29 echo cardiogram --reduced EF, no cardiogenic source of embolism  Antimicrobials:   None   Culture data:   None           Subjective: All history collected through video phonic interpreter.  Persistent left hemiparesis, improving.  His pain is improved.  No fever, cough.  No chest pain at present, although he has had some atypical sharp, fleeting left-sided chest pain at rest for the last week or so.  Objective: Vitals:   06/19/19 2015 06/19/19 2201 06/20/19 0042 06/20/19 1100  BP: (!) 190/93 (!) 186/92 (!) 179/88 (!) 178/110  Pulse:   66 68  Resp:   16 16  Temp:   98.6 F (37 C) 98 F (36.7 C)  TempSrc:   Oral Oral  SpO2:   97% 100%  Weight:      Height:        Intake/Output Summary (Last 24 hours) at 06/20/2019 1454 Last  data filed at 06/20/2019 1300 Gross per 24 hour  Intake 120 ml  Output 750 ml  Net -630 ml   Filed Weights   06/18/19 1111  Weight: 68 kg    Examination: General appearance:  adult male, alert and in no acute distress.   HEENT: Anicteric, conjunctiva pink, lids and lashes normal. No nasal deformity, discharge, epistaxis.  Lips moist, teeth normal. OP normal, no oral lesions.     Skin: Warm and dry.  No suspicious rashes or lesions. Cardiac: RRR, no murmurs appreciated.  No LE edema.    Respiratory: Normal respiratory rate and rhythm.  CTAB without rales or wheezes. Abdomen: Abdomen soft.  No tenderness palpation or guarding. No ascites, distension, hepatosplenomegaly.   MSK: No deformities or effusions of the large joints of the upper or lower extremities bilaterally. Neuro: Awake and alert. Naming is grossly intact, and the patient's recall, recent and remote, as well as general fund of knowledge seem within normal limits.  Muscle tone normal, without fasciculations.  Moves left-sided extremities with moderate weakness, right-sided extremities are normal.  Speech fluent.   Psych: Sensorium intact and responding to questions, attention normal. Affect normal.  Judgment and insight appear normal.     Data Reviewed: I have personally reviewed following labs and imaging studies:  CBC: Recent Labs  Lab 06/18/19 1129 06/20/19 0629  WBC 6.7 6.5  NEUTROABS 5.0  --   HGB 15.7 14.2  HCT 48.2 44.3  MCV 86.1 87.4  PLT 209 196   Basic Metabolic Panel: Recent Labs  Lab 06/18/19 1129 06/19/19 1500 06/20/19 0629  NA 142  --  144  K 2.9*  --  3.2*  CL 105  --  110  CO2 27  --  24  GLUCOSE 106*  --  92  BUN 19  --  21  CREATININE 1.05  --  1.05  CALCIUM 9.5  --  9.0  MG  --  1.8  --    GFR: Estimated Creatinine Clearance: 59.8 mL/min (by C-G formula based on SCr of 1.05 mg/dL). Liver Function Tests: Recent Labs  Lab 06/18/19 1129 06/20/19 0629  AST 16 15  ALT 12 11  ALKPHOS 75 61  BILITOT 0.9 1.0  PROT 7.4 6.4*  ALBUMIN 4.1 3.8   No results for input(s): LIPASE, AMYLASE in the last 168 hours. No results for input(s): AMMONIA in the last 168 hours. Coagulation Profile: Recent Labs  Lab 06/18/19 1129  INR 0.9   Cardiac Enzymes: No results for input(s): CKTOTAL, CKMB, CKMBINDEX, TROPONINI in the last 168 hours. BNP (last 3 results) No results  for input(s): PROBNP in the last 8760 hours. HbA1C: Recent Labs    06/19/19 0422  HGBA1C 5.4   CBG: Recent Labs  Lab 06/18/19 1129 06/18/19 1202  GLUCAP 99 98   Lipid Profile: Recent Labs    06/19/19 0422  CHOL 210*  HDL 51  LDLCALC 136*  TRIG 113  CHOLHDL 4.1   Thyroid Function Tests: No results for input(s): TSH, T4TOTAL, FREET4, T3FREE, THYROIDAB in the last 72 hours. Anemia Panel: No results for input(s): VITAMINB12, FOLATE, FERRITIN, TIBC, IRON, RETICCTPCT in the last 72 hours. Urine analysis: No results found for: COLORURINE, APPEARANCEUR, LABSPEC, PHURINE, GLUCOSEU, HGBUR, BILIRUBINUR, KETONESUR, PROTEINUR, UROBILINOGEN, NITRITE, LEUKOCYTESUR Sepsis Labs: @LABRCNTIP (procalcitonin:4,lacticacidven:4)  ) Recent Results (from the past 240 hour(s))  Respiratory Panel by RT PCR (Flu A&B, Covid) - Nasopharyngeal Swab     Status: None   Collection Time: 06/18/19 12:18 PM  Specimen: Nasopharyngeal Swab  Result Value Ref Range Status   SARS Coronavirus 2 by RT PCR NEGATIVE NEGATIVE Final    Comment: (NOTE) SARS-CoV-2 target nucleic acids are NOT DETECTED. The SARS-CoV-2 RNA is generally detectable in upper respiratoy specimens during the acute phase of infection. The lowest concentration of SARS-CoV-2 viral copies this assay can detect is 131 copies/mL. A negative result does not preclude SARS-Cov-2 infection and should not be used as the sole basis for treatment or other patient management decisions. A negative result may occur with  improper specimen collection/handling, submission of specimen other than nasopharyngeal swab, presence of viral mutation(s) within the areas targeted by this assay, and inadequate number of viral copies (<131 copies/mL). A negative result must be combined with clinical observations, patient history, and epidemiological information. The expected result is Negative. Fact Sheet for Patients:   https://www.moore.com/ Fact Sheet for Healthcare Providers:  https://www.young.biz/ This test is not yet ap proved or cleared by the [place] FDA and  has been authorized for detection and/or diagnosis of SARS-CoV-2 by FDA under an Emergency Use Authorization (EUA). This EUA will remain  in effect (meaning this test can be used) for the duration of the COVID-19 declaration under Section 564(b)(1) of the Act, 21 U.S.C. section 360bbb-3(b)(1), unless the authorization is terminated or revoked sooner.    Influenza A by PCR NEGATIVE NEGATIVE Final   Influenza B by PCR NEGATIVE NEGATIVE Final    Comment: (NOTE) The Xpert Xpress SARS-CoV-2/FLU/RSV assay is intended as an aid in  the diagnosis of influenza from Nasopharyngeal swab specimens and  should not be used as a sole basis for treatment. Nasal washings and  aspirates are unacceptable for Xpert Xpress SARS-CoV-2/FLU/RSV  testing. Fact Sheet for Patients: https://www.moore.com/ Fact Sheet for Healthcare Providers: https://www.young.biz/ This test is not yet approved or cleared by the [place] FDA and  has been authorized for detection and/or diagnosis of SARS-CoV-2 by  FDA under an Emergency Use Authorization (EUA). This EUA will remain  in effect (meaning this test can be used) for the duration of the  Covid-19 declaration under Section 564(b)(1) of the Act, 21  U.S.C. section 360bbb-3(b)(1), unless the authorization is  terminated or revoked. Performed at [facility], [address], [place], [place] [address]          Radiology Studies: MR ANGIO HEAD WO CONTRAST  Result Date: 06/18/2019 CLINICAL DATA:  64 year old male with left side weakness, fall. Hypertensive. EXAM: MRI HEAD WITHOUT CONTRAST MRA HEAD WITHOUT CONTRAST TECHNIQUE: Multiplanar, multiecho pulse sequences of the brain and surrounding structures were obtained  without intravenous contrast. Angiographic images of the head were obtained using MRA technique without contrast. COMPARISON:  Head CT earlier today. FINDINGS: MRI HEAD FINDINGS Brain: Curvilinear area of restricted diffusion in the posterior right corona radiata tracking toward the posterior lentiform, in an area of about 11 millimeters (series 8, image 55). Faint T2 and FLAIR hyperintensity. No associated hemorrhage or mass effect. No other restricted diffusion. Patchy and confluent bilateral cerebral white matter T2 and FLAIR hyperintensity, also visible by CT today. Bilateral temporal lobe involvement. No cortical encephalomalacia or chronic cerebral blood products. Outside of the acute finding the deep gray nuclei are within normal limits. Brainstem and cerebellum appear within normal limits. No midline shift, mass effect, evidence of mass lesion, ventriculomegaly, extra-axial collection or acute intracranial hemorrhage. Cervicomedullary junction and pituitary are within normal limits. Vascular: Major intracranial vascular flow voids are preserved, with generalized intracranial artery tortuosity. Basilar artery  ectasia. The distal left vertebral artery appears dominant. Skull and upper cervical spine: Negative visible cervical spine. Normal bone marrow signal. Sinuses/Orbits: Negative orbits. Paranasal sinuses are clear. Other: Mild left mastoid effusion. Trace retained secretions in the nasopharynx. Right mastoids are clear. MRA HEAD FINDINGS Antegrade flow in the posterior circulation with dominant and dolichoectatic distal left vertebral artery. Normal left PICA. The right V4 segment is diminutive and might be supplied in a retrograde fashion, uncertain. No visible right vertebral artery flow void in the neck on sagittal T1 brain imaging makes this more likely. The left vertebral supplies the basilar without stenosis. Ectatic basilar artery. Widely patent SCA and PCA origins. Posterior communicating arteries  are diminutive or absent. The left PCA branches are within normal limits. The right P1 segment is tortuous but otherwise normal. There is right P2 segment signal loss which may be related to a downward trajectory of the vessel, uncertain. Distal right PCA branch enhancement seems preserved. Antegrade flow in both ICA siphons. Tortuous carotid segments. No siphon stenosis. Patent carotid termini. Normal MCA and ACA origins. Visible ACA branches are within normal limits. MCA M1 segments and bifurcations are patent without stenosis. Visible bilateral MCA branches are within normal limits. IMPRESSION: 1. Small acute lacunar infarct in the posterior right corona radiata. No associated hemorrhage or mass effect. 2. Underlying advanced bilateral cerebral white matter signal changes, nonspecific but favored due to chronic small vessel disease. 3. 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 And: - possible occlusion of the cervical right vertebral artery, supplied in a retrograde fashion from the vertebrobasilar junction. This is unrelated to #1. - questionable stenosis of the right PCA P2 segment, favor artifact instead. Electronically Signed   By: Genevie Ann M.D.   On: 06/18/2019 18:31   MR BRAIN WO CONTRAST  Result Date: 06/18/2019 CLINICAL DATA:  64 year old male with left side weakness, fall. Hypertensive. EXAM: MRI HEAD WITHOUT CONTRAST MRA HEAD WITHOUT CONTRAST TECHNIQUE: Multiplanar, multiecho pulse sequences of the brain and surrounding structures were obtained without intravenous contrast. Angiographic images of the head were obtained using MRA technique without contrast. COMPARISON:  Head CT earlier today. FINDINGS: MRI HEAD FINDINGS Brain: Curvilinear area of restricted diffusion in the posterior right corona radiata tracking toward the posterior lentiform, in an area of about 11 millimeters (series 8, image 55). Faint T2 and FLAIR hyperintensity. No associated hemorrhage or  mass effect. No other restricted diffusion. Patchy and confluent bilateral cerebral white matter T2 and FLAIR hyperintensity, also visible by CT today. Bilateral temporal lobe involvement. No cortical encephalomalacia or chronic cerebral blood products. Outside of the acute finding the deep gray nuclei are within normal limits. Brainstem and cerebellum appear within normal limits. No midline shift, mass effect, evidence of mass lesion, ventriculomegaly, extra-axial collection or acute intracranial hemorrhage. Cervicomedullary junction and pituitary are within normal limits. Vascular: Major intracranial vascular flow voids are preserved, with generalized intracranial artery tortuosity. Basilar artery ectasia. The distal left vertebral artery appears dominant. Skull and upper cervical spine: Negative visible cervical spine. Normal bone marrow signal. Sinuses/Orbits: Negative orbits. Paranasal sinuses are clear. Other: Mild left mastoid effusion. Trace retained secretions in the nasopharynx. Right mastoids are clear. MRA HEAD FINDINGS Antegrade flow in the posterior circulation with dominant and dolichoectatic distal left vertebral artery. Normal left PICA. The right V4 segment is diminutive and might be supplied in a retrograde fashion, uncertain. No visible right vertebral artery flow void in the neck on sagittal T1 brain imaging makes this more likely. The  left vertebral supplies the basilar without stenosis. Ectatic basilar artery. Widely patent SCA and PCA origins. Posterior communicating arteries are diminutive or absent. The left PCA branches are within normal limits. The right P1 segment is tortuous but otherwise normal. There is right P2 segment signal loss which may be related to a downward trajectory of the vessel, uncertain. Distal right PCA branch enhancement seems preserved. Antegrade flow in both ICA siphons. Tortuous carotid segments. No siphon stenosis. Patent carotid termini. Normal MCA and ACA  origins. Visible ACA branches are within normal limits. MCA M1 segments and bifurcations are patent without stenosis. Visible bilateral MCA branches are within normal limits. IMPRESSION: 1. Small acute lacunar infarct in the posterior right corona radiata. No associated hemorrhage or mass effect. 2. Underlying advanced bilateral cerebral white matter signal changes, nonspecific but favored due to chronic small vessel disease. 3. 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 And: - possible occlusion of the cervical right vertebral artery, supplied in a retrograde fashion from the vertebrobasilar junction. This is unrelated to #1. - questionable stenosis of the right PCA P2 segment, favor artifact instead. Electronically Signed   By: Odessa Fleming M.D.   On: 06/18/2019 18:31   US Carotid Bilateral (at [facility] and [facility] only)  Result Date: 06/19/2019 CLINICAL DATA:  Stroke.  History of hypertension and smoking. EXAM: BILATERAL CAROTID DUPLEX ULTRASOUND TECHNIQUE: Wallace Cullens scale imaging, color Doppler and duplex ultrasound were performed of bilateral carotid and vertebral arteries in the neck. COMPARISON:  MRI/MRA - earlier same day FINDINGS: Criteria: Quantification of carotid stenosis is based on velocity parameters that correlate the residual internal carotid diameter with NASCET-based stenosis levels, using the diameter of the distal internal carotid lumen as the denominator for stenosis measurement. The following velocity measurements were obtained: RIGHT ICA: 74/50 cm/sec CCA: 85/18 cm/sec SYSTOLIC ICA/CCA RATIO:  0.9 ECA: 62 cm/sec LEFT ICA: 67/18 cm/sec CCA: 82/17 cm/sec SYSTOLIC ICA/CCA RATIO:  0.8 ECA: 82 cm/sec RIGHT CAROTID ARTERY: There is a minimal amount of eccentric echogenic plaque involving the proximal aspect of the right internal carotid artery (image 22, not resulting in elevated peak systolic velocities within the interrogated course the right internal carotid artery to  suggest a hemodynamically significant stenosis. RIGHT VERTEBRAL ARTERY:  Not visualized LEFT CAROTID ARTERY: There is a minimal amount of intimal thickening involving the distal aspect the left common carotid artery, extending to involve the left carotid bulb, origin and proximal aspects of the left internal carotid artery, not resulting in hemodynamically significant stenosis within the interrogated course the left internal carotid artery to suggest a hemodynamically significant stenosis. LEFT VERTEBRAL ARTERY:  Antegrade flow IMPRESSION: 1. Minimal amount of bilateral intimal thickening/atherosclerotic plaque, right greater than left, not resulting in a hemodynamically significant stenosis within either internal carotid artery. 2. 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 Clinical correlation is advised. 3. Antegrade flow within the left vertebral artery. Electronically Signed   By: Simonne Come M.D.   On: 06/19/2019 07:29   ECHOCARDIOGRAM COMPLETE  Result Date: 06/19/2019   ECHOCARDIOGRAM REPORT   Patient Name:   Juan Hughes Date of Exam: 06/19/2019 Medical Rec #:  [id number]              Height:       60.0 in Accession #:    [id number]  Weight:       150.0 lb Date of Birth:  April 17, 1956              BSA:          1.65 m Patient Age:    63 years               BP:           192/102 mmHg Patient Gender: M                      HR:           71 bpm. Exam Location:  [facility] Procedure: 2D Echo, Cardiac Doppler and Color Doppler Indications:     Stroke 434.91  History:         Patient has no prior history of Echocardiogram examinations.                  Risk Factors:Hypertension.  Sonographer:     Cristela Blue RDCS (AE) Referring Phys:  [id number] Brien Few NIU Diagnosing Phys: Adrian Blackwater MD IMPRESSIONS  1. Left  ventricular ejection fraction, by visual estimation, is 40 to 45%. The left ventricle has moderately decreased function. There is severely increased left ventricular hypertrophy.  2. Left ventricular diastolic parameters are consistent with Grade I diastolic dysfunction (impaired relaxation).  3. Small to moderate myocardial infarction.  4. The left ventricle has no regional wall motion abnormalities.  5. Global right ventricle has moderately reduced systolic function.The right ventricular size is moderately enlarged. Mildly increased right ventricular wall thickness.  6. Left atrial size was moderately dilated.  7. Right atrial size was moderately dilated.  8. The mitral valve is normal in structure. Mild mitral valve regurgitation. No evidence of mitral stenosis.  9. The tricuspid valve is normal in structure. 10. The tricuspid valve is normal in structure. Tricuspid valve regurgitation is mild. 11. The aortic valve is normal in structure. Aortic valve regurgitation is not visualized. Mild aortic valve sclerosis without stenosis. 12. The pulmonic valve was normal in structure. Pulmonic valve regurgitation is not visualized. 13. Mildly elevated pulmonary artery systolic pressure. 14. The inferior vena cava is normal in size with greater than 50% respiratory variability, suggesting right atrial pressure of 3 mmHg. FINDINGS  Left Ventricle: Left ventricular ejection fraction, by visual estimation, is 40 to 45%. The left ventricle has moderately decreased function. The left ventricle has no regional wall motion abnormalities. There is severely increased left ventricular hypertrophy. Concentric left ventricular hypertrophy. Left ventricular diastolic parameters are consistent with Grade I diastolic dysfunction (impaired relaxation). Normal left atrial pressure. There is evidence of a small to moderate myocardial infarction. Right Ventricle: The right ventricular size is moderately enlarged. Mildly increased right  ventricular wall thickness. Global RV systolic function is has moderately reduced systolic function. The tricuspid regurgitant velocity is 2.44 m/s, and with an assumed right atrial pressure of 10 mmHg, the estimated right ventricular systolic pressure is mildly elevated at 33.8 mmHg. Left Atrium: Left atrial size was moderately dilated. Right Atrium: Right atrial size was moderately dilated Pericardium: There is no evidence of pericardial effusion. Mitral Valve: The mitral valve is normal in structure. Mild mitral valve regurgitation. No evidence of mitral valve stenosis by observation. Tricuspid Valve: The tricuspid valve is normal in structure. Tricuspid valve regurgitation is mild. Aortic Valve: The aortic valve is normal in structure. Aortic valve regurgitation is not visualized. Mild aortic valve sclerosis is present, with no evidence of aortic valve  stenosis. Aortic valve mean gradient measures 7.0 mmHg. Aortic valve peak gradient measures 11.9 mmHg. Aortic valve area, by VTI measures 2.12 cm. Pulmonic Valve: The pulmonic valve was normal in structure. Pulmonic valve regurgitation is not visualized. Pulmonic regurgitation is not visualized. Aorta: The aortic root, ascending aorta and aortic arch are all structurally normal, with no evidence of dilitation or obstruction. Venous: The inferior vena cava is normal in size with greater than 50% respiratory variability, suggesting right atrial pressure of 3 mmHg. IAS/Shunts: No atrial level shunt detected by color flow Doppler. There is no evidence of a patent foramen ovale. No ventricular septal defect is seen or detected. There is no evidence of an atrial septal defect.  LEFT VENTRICLE PLAX 2D LVIDd:         5.78 cm       Diastology LVIDs:         4.66 cm       LV e' lateral:   4.03 cm/s LV PW:         1.21 cm       LV E/e' lateral: 14.3 LV IVS:        1.12 cm       LV e' medial:    3.59 cm/s LVOT diam:     2.20 cm       LV E/e' medial:  16.1 LV SV:         65  ml LV SV Index:   37.70 LVOT Area:     3.80 cm  LV Volumes (MOD) LV area d, A2C:    31.20 cm LV area d, A4C:    34.30 cm LV area s, A2C:    19.60 cm LV area s, A4C:    24.40 cm LV major d, A2C:   7.58 cm LV major d, A4C:   7.57 cm LV major s, A2C:   6.28 cm LV major s, A4C:   6.82 cm LV vol d, MOD A2C: 107.0 ml LV vol d, MOD A4C: 128.0 ml LV vol s, MOD A2C: 52.2 ml LV vol s, MOD A4C: 73.1 ml LV SV MOD A2C:     54.8 ml LV SV MOD A4C:     128.0 ml LV SV MOD BP:      52.5 ml RIGHT VENTRICLE RV Basal diam:  2.92 cm RV S prime:     15.80 cm/s TAPSE (M-mode): 3.7 cm LEFT ATRIUM             Index       RIGHT ATRIUM           Index LA diam:        4.40 cm 2.66 cm/m  RA Area:     14.30 cm LA Vol (A2C):   78.8 ml 47.71 ml/m RA Volume:   27.40 ml  16.59 ml/m LA Vol (A4C):   60.4 ml 36.57 ml/m LA Biplane Vol: 75.5 ml 45.71 ml/m  AORTIC VALVE                    PULMONIC VALVE AV Area (Vmax):    1.79 cm     RVOT Peak grad: 2 mmHg AV Area (Vmean):   1.80 cm AV Area (VTI):     2.12 cm AV Vmax:           172.50 cm/s AV Vmean:          121.500 cm/s AV VTI:  0.362 m AV Peak Grad:      11.9 mmHg AV Mean Grad:      7.0 mmHg LVOT Vmax:         81.30 cm/s LVOT Vmean:        57.500 cm/s LVOT VTI:          0.202 m LVOT/AV VTI ratio: 0.56  AORTA Ao Root diam: 3.40 cm MITRAL VALVE                         TRICUSPID VALVE MV Area (PHT): 2.00 cm              TR Peak grad:   23.8 mmHg MV PHT:        110.20 msec           TR Vmax:        244.00 cm/s MV Decel Time: 380 msec MV E velocity: 57.70 cm/s  103 cm/s  SHUNTS MV A velocity: 111.00 cm/s 70.3 cm/s Systemic VTI:  0.20 m MV E/A ratio:  0.52        1.5       Systemic Diam: 2.20 cm  Adrian Blackwater MD Electronically signed by Adrian Blackwater MD Signature Date/Time: 06/19/2019/4:20:22 PM    Final         Scheduled Meds:   stroke: mapping our early stages of recovery book   Does not apply Once   aspirin  300 mg Rectal Daily   Or   aspirin EC  325 mg Oral Daily    atorvastatin  80 mg Oral q1800   enoxaparin (LOVENOX) injection  40 mg Subcutaneous Q24H   feeding supplement (ENSURE ENLIVE)  237 mL Oral BID BM   [START ON 06/21/2019] losartan  50 mg Oral Daily   metoprolol succinate  12.5 mg Oral Daily   multivitamin with minerals  1 tablet Oral Daily   nicotine  21 mg Transdermal Daily   sodium chloride flush  3 mL Intravenous Once   Continuous Infusions:   LOS: 1 day    Time spent: 25 min    Alberteen Sam, MD [facility] 06/20/2019, 2:54 PM     Please page though AMION or Epic secure chat:  For [employer], Higher education careers adviser

## 2019-06-21 LAB — BASIC METABOLIC PANEL
Anion gap: 7 (ref 5–15)
BUN: 20 mg/dL (ref 8–23)
CO2: 27 mmol/L (ref 22–32)
Calcium: 9.5 mg/dL (ref 8.9–10.3)
Chloride: 109 mmol/L (ref 98–111)
Creatinine, Ser: 1.07 mg/dL (ref 0.61–1.24)
GFR calc Af Amer: >60 mL/min (ref >60)
GFR calc non Af Amer: >60 mL/min (ref >60)
Glucose, Bld: 90 mg/dL (ref 70–99)
Potassium: 3.4 mmol/L — ABNORMAL LOW (ref 3.5–5.1)
Sodium: 143 mmol/L (ref 135–145)

## 2019-06-21 MED ORDER — TRAMADOL HCL 50 MG PO TABS
50.0000 mg | ORAL_TABLET | Freq: Four times a day (QID) | ORAL | Status: DC | PRN
  Administered 2019-06-22: 50 mg via ORAL
  Filled 2019-06-21: qty 1

## 2019-06-21 MED ORDER — POTASSIUM CHLORIDE CRYS ER 20 MEQ PO TBCR
40.0000 meq | EXTENDED_RELEASE_TABLET | Freq: Once | ORAL | Status: AC
  Administered 2019-06-21: 40 meq via ORAL
  Filled 2019-06-21: qty 2

## 2019-06-21 MED ORDER — ACETAMINOPHEN 500 MG PO TABS
1000.0000 mg | ORAL_TABLET | Freq: Three times a day (TID) | ORAL | Status: DC
  Administered 2019-06-21 – 2019-06-23 (×7): 1000 mg via ORAL
  Filled 2019-06-21 (×7): qty 2

## 2019-06-21 MED ORDER — LOSARTAN POTASSIUM 50 MG PO TABS
100.0000 mg | ORAL_TABLET | Freq: Every day | ORAL | Status: DC
  Administered 2019-06-21 – 2019-06-23 (×3): 100 mg via ORAL
  Filled 2019-06-21 (×3): qty 2

## 2019-06-21 MED ORDER — HYDROCHLOROTHIAZIDE 25 MG PO TABS
25.0000 mg | ORAL_TABLET | Freq: Every day | ORAL | Status: DC
  Administered 2019-06-21: 25 mg via ORAL
  Filled 2019-06-21: qty 1

## 2019-06-21 NOTE — Progress Notes (Signed)
Progress Note  Patient Name: Juan Hughes Date of Encounter: 06/21/2019  Primary Cardiologist: [place] rounding  Subjective  Spanish translator used. No acute events overnight.  Patient denies chest pain or shortness of breath.  Blood pressures have been elevated.  Inpatient Medications    Scheduled Meds: .  stroke: mapping our early stages of recovery book   Does not apply Once  . aspirin  300 mg Rectal Daily   Or  . aspirin EC  325 mg Oral Daily  . atorvastatin  80 mg Oral q1800  . carvedilol  6.25 mg Oral BID WC  . enoxaparin (LOVENOX) injection  40 mg Subcutaneous Q24H  . feeding supplement (ENSURE ENLIVE)  237 mL Oral BID BM  . hydrochlorothiazide  25 mg Oral Daily  . losartan  100 mg Oral Daily  . multivitamin with minerals  1 tablet Oral Daily  . nicotine  21 mg Transdermal Daily  . sodium chloride flush  10 mL Intravenous Q12H  . sodium chloride flush  3 mL Intravenous Once   Continuous Infusions:  PRN Meds: acetaminophen **OR** acetaminophen (TYLENOL) oral liquid 160 mg/5 mL **OR** acetaminophen, oxyCODONE-acetaminophen, senna-docusate   Vital Signs    Vitals:   06/20/19 2115 06/20/19 2351 06/21/19 0419 06/21/19 0831  BP: (!) 181/106 (!) 183/93 (!) 188/95 (!) 192/110  Pulse: 62 62 62 70  Resp:  17 18 18   Temp:  98.5 F (36.9 C) 98 F (36.7 C) 97.6 F (36.4 C)  TempSrc:  Oral Oral   SpO2:  98% 97% 97%  Weight:      Height:        Intake/Output Summary (Last 24 hours) at 06/21/2019 1113 Last data filed at 06/21/2019 0900 Gross per 24 hour  Intake 1200 ml  Output 900 ml  Net 300 ml   Last 3 Weights 06/18/2019  Weight (lbs) 150 lb  Weight (kg) 68.04 kg      Telemetry    Not on telemetry- Personally Reviewed  ECG    Physical Exam   GEN: No acute distress.   Neck: No JVD Cardiac: RRR, no murmurs, rubs, or gallops.  Respiratory: Clear to auscultation bilaterally. GI: Soft, nontender, non-distended  MS: No edema; No  deformity. Neuro:   Left arm and left leg weakness noted Psych: Normal affect   Labs    High Sensitivity Troponin:  No results for input(s): TROPONINIHS in the last 720 hours.    Chemistry Recent Labs  Lab 06/18/19 1129 06/20/19 0629 06/21/19 0501  NA 142 144 143  K 2.9* 3.2* 3.4*  CL 105 110 109  CO2 27 24 27   GLUCOSE 106* 92 90  BUN 19 21 20   CREATININE 1.05 1.05 1.07  CALCIUM 9.5 9.0 9.5  PROT 7.4 6.4*  --   ALBUMIN 4.1 3.8  --   AST 16 15  --   ALT 12 11  --   ALKPHOS 75 61  --   BILITOT 0.9 1.0  --   GFRNONAA >60 >60 >60  GFRAA >60 >60 >60  ANIONGAP 10 10 7      Hematology Recent Labs  Lab 06/18/19 1129 06/20/19 0629  WBC 6.7 6.5  RBC 5.60 5.07  HGB 15.7 14.2  HCT 48.2 44.3  MCV 86.1 87.4  MCH 28.0 28.0  MCHC 32.6 32.1  RDW 13.3 13.7  PLT 209 196    BNPNo results for input(s): BNP, PROBNP in the last 168 hours.   DDimer No results for input(s): DDIMER in  the last 168 hours.   Radiology    No results found.  Cardiac Studies   TTE 06/18/2018 1. Left ventricular ejection fraction, by visual estimation, is 40 to  45%. The left ventricle has moderately decreased function. There is  severely increased left ventricular hypertrophy.  2. Left ventricular diastolic parameters are consistent with Grade I  diastolic dysfunction (impaired relaxation).  3. Small to moderate myocardial infarction.  4. The left ventricle has no regional wall motion abnormalities.  5. Global right ventricle has moderately reduced systolic function.The  right ventricular size is moderately enlarged. Mildly increased right  ventricular wall thickness.  6. Left atrial size was moderately dilated.  7. Right atrial size was moderately dilated.  8. The mitral valve is normal in structure. Mild mitral valve  regurgitation. No evidence of mitral stenosis.  9. The tricuspid valve is normal in structure.  10. The tricuspid valve is normal in structure. Tricuspid valve    regurgitation is mild.  11. The aortic valve is normal in structure. Aortic valve regurgitation is  not visualized. Mild aortic valve sclerosis without stenosis.  12. The pulmonic valve was normal in structure. Pulmonic valve  regurgitation is not visualized.  13. Mildly elevated pulmonary artery systolic pressure.  14. The inferior vena cava is normal in size with greater than 50%  respiratory variability, suggesting right atrial pressure of 3 mmHg.   Patient Profile     64 y.o. male with history of hypertension, smoking, CVA, admitted with left-sided weakness, found to have right lacunar infarct and chronic small vessel disease on brain MRI.  Being seen for mild to moderately reduced ejection fraction EF 40 to 45%.  Assessment & Plan    1. Mild to moderately reduced ejection fraction, EF 40 to 45% -Reduced EF could be secondary to long uncontrolled hypertension. -We will plan for Lexiscan tomorrow to rule out ischemia -On Coreg and losartan. -He appears euvolemic  2.  Hypertension -BP not controlled.  I agree with increased titration of BP meds -Losartan100, Coreg 6.25 twice daily, HCTZ  3.  CVA -On aspirin, statin.  Physical therapy -Regimen as per neurology and primary team.  4.  Smoking -On nicotine patch -Cessation recommended      Signed, [name], MD  06/21/2019, 11:13 AM

## 2019-06-21 NOTE — Progress Notes (Signed)
Physical Therapy Treatment Patient Details Name: Zaine Elsass MRN: [id number] DOB: Jul 22, 1955 Today's Date: 06/21/2019    History of Present Illness Pt is 64 yo male PMH of HTN presented to ED 1/28 for L sided weakness. MRI+ for small acute lacunar infarct in the posterior right coronaradiata. No associated hemorrhage or mass effect. 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  Pt with independent bed mobility and supervision in sitting.  Somewhat impulsive as I asked him to wait several times to turn off bed alarm and get up room but he continued to progress to EOB despite cues.  Steady in sitting.  Stood with min a x 1 and was able to progress gait 60' with RW and min a x 1.  Pt continues to need +1 assist at all times for mobility for balance and general safety.  Initially gait steady despite heavy reliance on walker for support and uneven step height and length.  As he fatigues, gait quality decreases and he drags LLE needing cues to advance fully.  He does have increasing R knee and shoulder pain with gait which along with fatigue limits distance.  Participated in exercises as described below.  Remained in recliner and overall pleased with his progress today.  Pt does well communicating with broken english and it does not hamper treatment without interpreter.  X 1 pt usedtranslation app on his phone to tell me about history of R knee pain and joking about him being an "old man" as the reason it pains him.     Follow Up Recommendations  CIR     Equipment Recommendations       Recommendations for Other Services       Precautions / Restrictions Precautions Precautions: Fall Restrictions Weight Bearing Restrictions: No    Mobility  Bed Mobility Overal bed mobility: Modified Independent                Transfers Overall transfer level: Needs  assistance Equipment used: Rolling walker (2 wheeled) Transfers: Sit to/from Stand Sit to Stand: Min assist            Ambulation/Gait Ambulation/Gait assistance: Min assist Gait Distance (Feet): 60 Feet Assistive device: Rolling walker (2 wheeled) Gait Pattern/deviations: Step-through pattern;Decreased step length - right;Decreased step length - left;Decreased dorsiflexion - left;Decreased weight shift to left;Wide base of support Gait velocity: decreased   General Gait Details: gait limtied by fatigue and R knee/shoulder pain.  gait quality decreased with distance.   Stairs             Wheelchair Mobility    Modified Rankin (Stroke Patients Only)       Balance Overall balance assessment: Needs assistance Sitting-balance support: No upper extremity supported;Feet supported Sitting balance-Leahy Scale: Fair     Standing balance support: Bilateral upper extremity supported Standing balance-Leahy Scale: Fair Standing balance comment: during ambulation with RW;                            Cognition Arousal/Alertness: Awake/alert Behavior During Therapy: WFL for tasks assessed/performed Overall Cognitive Status: Within Functional Limits for tasks assessed  Exercises Other Exercises Other Exercises: seated ankle pumps, LAQ and marches x 10, standing SLR x 10 BLE but limtied further by pain/fatigue in standing.    General Comments        Pertinent Vitals/Pain Pain Assessment: Faces Faces Pain Scale: Hurts little more Pain Location: RLE knee and shoulder Pain Descriptors / Indicators: Aching;Sore Pain Intervention(s): Limited activity within patient's tolerance;Monitored during session    Home Living                      Prior Function            PT Goals (current goals can now be found in the care plan section) Progress towards PT goals: Progressing toward goals     Frequency    7X/week      PT Plan Current plan remains appropriate    Co-evaluation              AM-PAC PT "6 Clicks" Mobility   Outcome Measure  Help needed turning from your back to your side while in a flat bed without using bedrails?: None Help needed moving from lying on your back to sitting on the side of a flat bed without using bedrails?: A Little Help needed moving to and from a bed to a chair (including a wheelchair)?: A Little Help needed standing up from a chair using your arms (e.g., wheelchair or bedside chair)?: A Little Help needed to walk in hospital room?: A Little Help needed climbing 3-5 steps with a railing? : A Lot 6 Click Score: 18    End of Session Equipment Utilized During Treatment: Gait belt Activity Tolerance: Patient tolerated treatment well Patient left: in chair;with chair alarm set;with call bell/phone within reach Nurse Communication: Mobility status Hemiplegia - Right/Left: Left Hemiplegia - dominant/non-dominant: Non-dominant Hemiplegia - caused by: Cerebral infarction     Time: 8882-8003 PT Time Calculation (min) (ACUTE ONLY): 14 min  Charges:  $Gait Training: 8-22 mins                    Chesley Noon, PTA 06/21/19, 11:51 AM

## 2019-06-21 NOTE — Plan of Care (Signed)
  Problem: Self-Care: Goal: Ability to participate in self-care as condition permits will improve Outcome: Progressing  Pt with increased mobility on the left side in the last 24 hours; active movement of left arm and left leg upon assessment in the morning. Goal: Verbalization of feelings and concerns over difficulty with self-care will improve Outcome: Progressing Goal: Ability to communicate needs accurately will improve Outcome: Progressing

## 2019-06-21 NOTE — Progress Notes (Signed)
PROGRESS NOTE    Juan Hughes  GYI:[id number] DOB: 12-06-1955 DOA: 06/18/2019 PCP: Patient, No Pcp Per      Brief Narrative:  Mr. Juan Hughes is a 64 y.o. M with hx HTN untreated who presented with acute left hemiparesis.  Around 2 AM the morning of admission, the patient noticed left-sided weakness in the arm and leg without slurred speech or facial droop.  Presented to the ER around 11 AM, still with left hemiparesis.  Code stroke called, CT head unremarkable, neurology were consulted.         Assessment & Plan:  Acute stroke Patient admitted with new left hemiparesis.  MRI brain showed Small acute lacunar infarct in the posterior right corona radiata -Non-invasive angiography showed vertebral artery occlusion with collateral flow -Echocardiogram ordered, showed no cardiac source of embolism -Carotid imaging unremarkable   -Lipids ordered: LDL 135, continue atorvastatin 80 nightly -Aspirin ordered at admission --> continue aspirin 325 -Atrial fibrillation: not present on tele to date -tPA not given because outside window at time of presentation -Dysphagia screen ordered in ER -PT eval ordered: recommending CIR -Smoking cessation: Recommended, modalities discussed --> start wellbutrin    New onset LV systolic dysfunction Patient admitted for stroke, found incidentally on echocardiogram-EF 40 to 45%.  The initial read suggested a regional wall motion abnormality, cardiology were consulted, doubt acute infarct, recommend outpatient ischemic evaluation No evidence of congestive heart failure.  -Continue new carvedilol, losartan, HCTZ -Avoid amlodipine -Ischemic work-up as an outpatient   Hypertensive urgency Initially were allowing permissive hypertension.  MRI showed small vessel disease due to hypertension.  Discussed with neurology. -Continue losartan, HCTZ, increase doses -Continue carvedilol, HR limits titration  Hypokalemia Magnesium normal -Supplement  potassium  Osteoarthritis Patient takes Aleve twice daily about 3 to 4 days/week for the last several years to treat osteoarthritis of the bilateral knees.  At present he has severe right knee pain and right shoulder pain when he is walking due to heavy reliance on them given his left-sided hemiparesis.  NSAID use is less than optimal in a patient with recent stroke, new onset LV dysfunction. -Schedule Tylenol -Trial low-dose tramadol         Disposition: The patient is a limited acute left hemiparesis, found to have new stroke.  In addition he was found to have severe range hypertension requiring active titration of antihypertensives, as well as new reduced LV systolic function.   At baseline the patient still works, and is the primary breadwinner for his family, but at present, the patient is unable to walk without assistance, and is in need of significant rehab to return to his prior level of function.   He does not have insurance, and we are evaluating for eligibility for charity care for inpatient rehab.  In the meantime, cardiology have recommended further ischemic work-up here in the hospital.  If tomorrow's ischemic work-up is reassuring, and the patient is not eligible for CIR, will likely plan for discharge to home tomorrow.  The patient's sister or his wife should be notified about transport.          MDM: The below labs and imaging reports reviewed and summarized above.  Medication management as above.     DVT prophylaxis: Lovenox Code Status: FULL Family Communication: Sister at the bedside    Consultants:   Neurology  Cardiology  Procedures:   1/28 MRI brain / MRA head -- right corona radiata infarction, possible occlusion of the cervical right vertebral artery  1/29 US  carotid -- no blockages  1/29 echo cardiogram --reduced EF, no cardiogenic source of embolism  2/1 nuclear cardiac perfusion imaging-pending  Antimicrobials:   None   Culture  data:   None           Subjective: All history collected through video phonic interpreter.  The patient's left hemiparesis is still dense, but improving.  His right knee pain is worse with standing.  He has had no chest pain, palpitations, leg swelling, dyspnea.         Objective: Vitals:   06/20/19 2115 06/20/19 2351 06/21/19 0419 06/21/19 0831  BP: (!) 181/106 (!) 183/93 (!) 188/95 (!) 192/110  Pulse: 62 62 62 70  Resp:  17 18 18   Temp:  98.5 F (36.9 C) 98 F (36.7 C) 97.6 F (36.4 C)  TempSrc:  Oral Oral   SpO2:  98% 97% 97%  Weight:      Height:        Intake/Output Summary (Last 24 hours) at 06/21/2019 1515 Last data filed at 06/21/2019 1308 Gross per 24 hour  Intake 1560 ml  Output 1100 ml  Net 460 ml   Filed Weights   04-Jul-2019 1111  Weight: 68 kg    Examination: General appearance:  adult male, alert and in no acute distress.  Well-nourished HEENT: Anicteric, conjunctiva pink, lids and lashes normal. No nasal deformity, discharge, epistaxis.  Lips moist, teeth normal. OP normal, no oral lesions.  Hearing normal. Skin: Warm and dry.  No suspicious rashes or lesions. Cardiac: RRR, no murmurs appreciated.  No LE edema.    Respiratory: Normal respiratory rate and rhythm.  CTAB without rales or wheezes. MSK: Rickets or bowleg.  Normal muscle bulk and tone. Neuro: Awake and alert.  Cranial nerves II through XII intact.  Naming is grossly intact, and the patient's recall, recent and remote, as well as general fund of knowledge seem within normal limits.  Muscle tone normal, without fasciculations.  Moves right side normally, left side has 4/5 weakness in the upper and lower extremities.  It is also somewhat ataxic.  Speech fluent.   Psych: Sensorium intact and responding to questions, attention normal. Affect pleasant.  Judgment and insight appear normal.       Data Reviewed: I have personally reviewed following labs and imaging studies:  CBC: Recent  Labs  Lab 07-04-2019 1129 06/20/19 0629  WBC 6.7 6.5  NEUTROABS 5.0  --   HGB 15.7 14.2  HCT 48.2 44.3  MCV 86.1 87.4  PLT 209 196   Basic Metabolic Panel: Recent Labs  Lab 07-04-19 1129 06/19/19 1500 06/20/19 0629 06/21/19 0501  NA 142  --  144 143  K 2.9*  --  3.2* 3.4*  CL 105  --  110 109  CO2 27  --  24 27  GLUCOSE 106*  --  92 90  BUN 19  --  21 20  CREATININE 1.05  --  1.05 1.07  CALCIUM 9.5  --  9.0 9.5  MG  --  1.8  --   --    GFR: Estimated Creatinine Clearance: 58.7 mL/min (by C-G formula based on SCr of 1.07 mg/dL). Liver Function Tests: Recent Labs  Lab 07-04-19 1129 06/20/19 0629  AST 16 15  ALT 12 11  ALKPHOS 75 61  BILITOT 0.9 1.0  PROT 7.4 6.4*  ALBUMIN 4.1 3.8   No results for input(s): LIPASE, AMYLASE in the last 168 hours. No results for input(s): AMMONIA in the last 168  hours. Coagulation Profile: Recent Labs  Lab 06/18/19 1129  INR 0.9   Cardiac Enzymes: No results for input(s): CKTOTAL, CKMB, CKMBINDEX, TROPONINI in the last 168 hours. BNP (last 3 results) No results for input(s): PROBNP in the last 8760 hours. HbA1C: Recent Labs    06/19/19 0422  HGBA1C 5.4   CBG: Recent Labs  Lab 06/18/19 1129 06/18/19 1202  GLUCAP 99 98   Lipid Profile: Recent Labs    06/19/19 0422  CHOL 210*  HDL 51  LDLCALC 136*  TRIG 113  CHOLHDL 4.1   Thyroid Function Tests: No results for input(s): TSH, T4TOTAL, FREET4, T3FREE, THYROIDAB in the last 72 hours. Anemia Panel: No results for input(s): VITAMINB12, FOLATE, FERRITIN, TIBC, IRON, RETICCTPCT in the last 72 hours. Urine analysis: No results found for: COLORURINE, APPEARANCEUR, LABSPEC, PHURINE, GLUCOSEU, HGBUR, BILIRUBINUR, KETONESUR, PROTEINUR, UROBILINOGEN, NITRITE, LEUKOCYTESUR Sepsis Labs: @LABRCNTIP (procalcitonin:4,lacticacidven:4)  ) Recent Results (from the past 240 hour(s))  Respiratory Panel by RT PCR (Flu A&B, Covid) - Nasopharyngeal Swab     Status: None    Collection Time: 06/18/19 12:18 PM   Specimen: Nasopharyngeal Swab  Result Value Ref Range Status   SARS Coronavirus 2 by RT PCR NEGATIVE NEGATIVE Final    Comment: (NOTE) SARS-CoV-2 target nucleic acids are NOT DETECTED. The SARS-CoV-2 RNA is generally detectable in upper respiratoy specimens during the acute phase of infection. The lowest concentration of SARS-CoV-2 viral copies this assay can detect is 131 copies/mL. A negative result does not preclude SARS-Cov-2 infection and should not be used as the sole basis for treatment or other patient management decisions. A negative result may occur with  improper specimen collection/handling, submission of specimen other than nasopharyngeal swab, presence of viral mutation(s) within the areas targeted by this assay, and inadequate number of viral copies (<131 copies/mL). A negative result must be combined with clinical observations, patient history, and epidemiological information. The expected result is Negative. Fact Sheet for Patients:  PinkCheek.be Fact Sheet for Healthcare Providers:  GravelBags.it This test is not yet ap proved or cleared by the [place] FDA and  has been authorized for detection and/or diagnosis of SARS-CoV-2 by FDA under an Emergency Use Authorization (EUA). This EUA will remain  in effect (meaning this test can be used) for the duration of the COVID-19 declaration under Section 564(b)(1) of the Act, 21 U.S.C. section 360bbb-3(b)(1), unless the authorization is terminated or revoked sooner.    Influenza A by PCR NEGATIVE NEGATIVE Final   Influenza B by PCR NEGATIVE NEGATIVE Final    Comment: (NOTE) The Xpert Xpress SARS-CoV-2/FLU/RSV assay is intended as an aid in  the diagnosis of influenza from Nasopharyngeal swab specimens and  should not be used as a sole basis for treatment. Nasal washings and  aspirates are unacceptable for Xpert Xpress  SARS-CoV-2/FLU/RSV  testing. Fact Sheet for Patients: PinkCheek.be Fact Sheet for Healthcare Providers: GravelBags.it This test is not yet approved or cleared by the [place] FDA and  has been authorized for detection and/or diagnosis of SARS-CoV-2 by  FDA under an Emergency Use Authorization (EUA). This EUA will remain  in effect (meaning this test can be used) for the duration of the  Covid-19 declaration under Section 564(b)(1) of the Act, 21  U.S.C. section 360bbb-3(b)(1), unless the authorization is  terminated or revoked. Performed at [facility], [address], [place], [place] [address]          Radiology Studies: No results found.      Scheduled  Meds: .  stroke: mapping our early stages of recovery book   Does not apply Once  . aspirin  300 mg Rectal Daily   Or  . aspirin EC  325 mg Oral Daily  . atorvastatin  80 mg Oral q1800  . carvedilol  6.25 mg Oral BID WC  . enoxaparin (LOVENOX) injection  40 mg Subcutaneous Q24H  . feeding supplement (ENSURE ENLIVE)  237 mL Oral BID BM  . hydrochlorothiazide  25 mg Oral Daily  . losartan  100 mg Oral Daily  . multivitamin with minerals  1 tablet Oral Daily  . nicotine  21 mg Transdermal Daily  . sodium chloride flush  10 mL Intravenous Q12H  . sodium chloride flush  3 mL Intravenous Once   Continuous Infusions:   LOS: 2 days    Time spent: 25 min    Alberteen Sam, MD [facility] 06/21/2019, 3:15 PM     Please page though AMION or Epic secure chat:  For [employer], Higher education careers adviser

## 2019-06-22 ENCOUNTER — Other Ambulatory Visit: Payer: Self-pay

## 2019-06-22 DIAGNOSIS — I639 Cerebral infarction, unspecified: Secondary | ICD-10-CM

## 2019-06-22 HISTORY — DX: Cerebral infarction, unspecified: I63.9

## 2019-06-22 LAB — CBC
HCT: 48.3 % (ref 39.0–52.0)
Hemoglobin: 15.7 g/dL (ref 13.0–17.0)
MCH: 28.1 pg (ref 26.0–34.0)
MCHC: 32.5 g/dL (ref 30.0–36.0)
MCV: 86.6 fL (ref 80.0–100.0)
Platelets: 213 10*3/uL (ref 150–400)
RBC: 5.58 MIL/uL (ref 4.22–5.81)
RDW: 13.5 % (ref 11.5–15.5)
WBC: 7 10*3/uL (ref 4.0–10.5)
nRBC: 0 % (ref 0.0–0.2)

## 2019-06-22 LAB — BASIC METABOLIC PANEL
Anion gap: 11 (ref 5–15)
BUN: 25 mg/dL — ABNORMAL HIGH (ref 8–23)
CO2: 23 mmol/L (ref 22–32)
Calcium: 9.7 mg/dL (ref 8.9–10.3)
Chloride: 105 mmol/L (ref 98–111)
Creatinine, Ser: 1.17 mg/dL (ref 0.61–1.24)
GFR calc Af Amer: >60 mL/min (ref >60)
GFR calc non Af Amer: >60 mL/min (ref >60)
Glucose, Bld: 104 mg/dL — ABNORMAL HIGH (ref 70–99)
Potassium: 3.9 mmol/L (ref 3.5–5.1)
Sodium: 139 mmol/L (ref 135–145)

## 2019-06-22 LAB — MAGNESIUM: Magnesium: 2 mg/dL (ref 1.7–2.4)

## 2019-06-22 LAB — TSH: TSH: 1.997 u[IU]/mL (ref 0.350–4.500)

## 2019-06-22 MED ORDER — HYDRALAZINE HCL 50 MG PO TABS
50.0000 mg | ORAL_TABLET | Freq: Three times a day (TID) | ORAL | Status: DC
  Administered 2019-06-22 (×3): 50 mg via ORAL
  Filled 2019-06-22 (×3): qty 1

## 2019-06-22 MED ORDER — HYDROCHLOROTHIAZIDE 25 MG PO TABS
50.0000 mg | ORAL_TABLET | Freq: Every day | ORAL | Status: DC
  Administered 2019-06-22 – 2019-06-23 (×2): 50 mg via ORAL
  Filled 2019-06-22 (×2): qty 2

## 2019-06-22 NOTE — Progress Notes (Signed)
   Contacted this AM given BP 188/100 and scheduled for NM Myoview this AM. Given elevated BP, defer NM Myoview until tomorrow. Resumed heart healthy diet and informed nuclear medicine. Will place orders for repeat attempt at stress test tomorrow if BP more controlled at that time.  Signed, Lennon Alstrom, PA-C 06/22/2019, 8:14 AM Pager [phone]

## 2019-06-22 NOTE — Progress Notes (Signed)
Progress Note  Patient Name: Juan Hughes Date of Encounter: 06/22/2019  Primary Cardiologist: [facility], Dr. Fletcher Anon rounding  Subjective   He denies chest pain, racing heart rate, palpitations, and shortness of breath today.  We reviewed the plan to try his stress test tomorrow morning and it was clarified that his stress test this morning was canceled secondary to elevated pressures.  Patient expresses understanding.  He reported that he received a medication last night that caused him to feel weak this morning; however, he is unable to indicate which medication this was with several medications given on review of MAR.  Inpatient Medications    Scheduled Meds: .  stroke: mapping our early stages of recovery book   Does not apply Once  . acetaminophen  1,000 mg Oral TID  . aspirin  300 mg Rectal Daily   Or  . aspirin EC  325 mg Oral Daily  . atorvastatin  80 mg Oral q1800  . carvedilol  6.25 mg Oral BID WC  . enoxaparin (LOVENOX) injection  40 mg Subcutaneous Q24H  . feeding supplement (ENSURE ENLIVE)  237 mL Oral BID BM  . hydrALAZINE  50 mg Oral Q8H  . hydrochlorothiazide  50 mg Oral Daily  . losartan  100 mg Oral Daily  . multivitamin with minerals  1 tablet Oral Daily  . nicotine  21 mg Transdermal Daily  . sodium chloride flush  10 mL Intravenous Q12H  . sodium chloride flush  3 mL Intravenous Once   Continuous Infusions:  PRN Meds: senna-docusate, traMADol   Vital Signs    Vitals:   06/22/19 0104 06/22/19 0404 06/22/19 0707 06/22/19 1122  BP: (!) 179/103 (!) 166/99 (!) 188/100 (!) 158/96  Pulse: 64 60 68 67  Resp:  16 18   Temp:  98.1 F (36.7 C) 98.7 F (37.1 C) 98.4 F (36.9 C)  TempSrc:   Oral Oral  SpO2: 96% 100% 98% 94%  Weight:      Height:        Intake/Output Summary (Last 24 hours) at 06/22/2019 1130 Last data filed at 06/22/2019 0827 Gross per 24 hour  Intake 970 ml  Output 700 ml  Net 270 ml   Last 3 Weights 06/18/2019  Weight (lbs)  150 lb  Weight (kg) 68.04 kg      Telemetry    SR - Personally Reviewed  ECG    No new tracings - Personally Reviewed  Physical Exam   GEN: No acute distress.   Neck: No JVD Cardiac: RRR, no murmurs, rubs, or gallops.  Respiratory: Clear to auscultation bilaterally. GI: Soft, nontender, non-distended  MS: No edema; No deformity.  Neuro:  Nonfocal  Psych: Normal affect   Labs    High Sensitivity Troponin:  No results for input(s): TROPONINIHS in the last 720 hours.    Cardiac EnzymesNo results for input(s): TROPONINI in the last 168 hours. No results for input(s): TROPIPOC in the last 168 hours.   Chemistry Recent Labs  Lab 06/18/19 1129 06/18/19 1129 06/20/19 0629 06/21/19 0501 06/22/19 0915  NA 142   < > 144 143 139  K 2.9*   < > 3.2* 3.4* 3.9  CL 105   < > 110 109 105  CO2 27   < > 24 27 23   GLUCOSE 106*   < > 92 90 104*  BUN 19   < > 21 20 25*  CREATININE 1.05   < > 1.05 1.07 1.17  CALCIUM 9.5   < >  9.0 9.5 9.7  PROT 7.4  --  6.4*  --   --   ALBUMIN 4.1  --  3.8  --   --   AST 16  --  15  --   --   ALT 12  --  11  --   --   ALKPHOS 75  --  61  --   --   BILITOT 0.9  --  1.0  --   --   GFRNONAA >60   < > >60 >60 >60  GFRAA >60   < > >60 >60 >60  ANIONGAP 10   < > 10 7 11    < > = values in this interval not displayed.     Hematology Recent Labs  Lab 06/18/19 1129 06/20/19 0629 06/22/19 0915  WBC 6.7 6.5 7.0  RBC 5.60 5.07 5.58  HGB 15.7 14.2 15.7  HCT 48.2 44.3 48.3  MCV 86.1 87.4 86.6  MCH 28.0 28.0 28.1  MCHC 32.6 32.1 32.5  RDW 13.3 13.7 13.5  PLT 209 196 213    BNPNo results for input(s): BNP, PROBNP in the last 168 hours.   DDimer No results for input(s): DDIMER in the last 168 hours.   Radiology    No results found.  Cardiac Studies   TTE 06/18/2018 1. Left ventricular ejection fraction, by visual estimation, is 40 to  45%. The left ventricle has moderately decreased function. There is  severely increased left ventricular  hypertrophy.  2. Left ventricular diastolic parameters are consistent with Grade I  diastolic dysfunction (impaired relaxation).  3. Small to moderate myocardial infarction.  4. The left ventricle has no regional wall motion abnormalities.  5. Global right ventricle has moderately reduced systolic function.The  right ventricular size is moderately enlarged. Mildly increased right  ventricular wall thickness.  6. Left atrial size was moderately dilated.  7. Right atrial size was moderately dilated.  8. The mitral valve is normal in structure. Mild mitral valve  regurgitation. No evidence of mitral stenosis.  9. The tricuspid valve is normal in structure.  10. The tricuspid valve is normal in structure. Tricuspid valve  regurgitation is mild.  11. The aortic valve is normal in structure. Aortic valve regurgitation is  not visualized. Mild aortic valve sclerosis without stenosis.  12. The pulmonic valve was normal in structure. Pulmonic valve  regurgitation is not visualized.  13. Mildly elevated pulmonary artery systolic pressure.  14. The inferior vena cava is normal in size with greater than 50%  respiratory variability, suggesting right atrial pressure of 3 mmHg.   Patient Profile     64 y.o. male with no previously known cardiac history and new HFrEF with reduced EF 40-45%, HTN, tobacco use, recent CVA, and L sided weakness, and recently found to have R lacunar infarct and chronic small vessel dz on MRI.   Assessment & Plan    HFrEF with mild to moderately reduced ejection fraction (EF 40 to 45%, 1/31) --No SOB or DOE.  No exertional or chest pain at rest.  Newly found reduced EF with echo as above suspicion that 2/2 uncontrolled hypertension.  Echo also significant for mildly elevated PASP. Cannot rule out cardiac ischemia as etiology of reduced EF with further workup planned via NM Lexiscan to further risk stratify. EKG without acute changes. Unfortunately, stress this AM  cancelled secondary to elevated pressures and rescheduled for tomorrow AM. Lipid panel checked as below with start of statin. Mg at goal. A1C wnl. Pending TSH.  Risk factors for cardiac etiology include HLD and tobacco use, as well as age. --Continue current losartan, Coreg, and HCTZ. NPO after midnight. Complete NM Myoview tomorrow. Continue to monitor I's/O's, daily standing weights. He reports weakness with a new medication overnight but unclear which medication to attribute to this weakness. HCTZ recently increased and could consider dividing into 25mg  BID rather than 50mg  daily to reduce side effects. For now, continue BB, HCTZ as tolerated with consideration of divided doses, and Losartan. Daily BMET.  HTN --BP not well controlled.  As above, NM Myoview rescheduled for tomorrow morning 2/2 elevated BP.  Continue to titrate antihypertensives for BP control.  Started on HCTZ for additional BP support today and could consider dividing into HCTZ 25mg  BID given he reports weakness today s/p receipt of 1 medication overnight but uncertain of which medication.  Recommend close monitoring of renal function electrolytes with daily BMET.  Titrate medications for optimal BP support. Recheck BP prior to stress tomorrow.  CVA --Followed by neurology.  Working with physical therapy.  Defer further work-up per neurology and primary care team. Strict BP control and smoking cessation recommended, as well as ongoing ASA and statin.   Current tobacco use --On nicotine patch.  Smoking cessation recommended for risk factor modification.  HLD --Lipid panel checked with cholesterol 210, LDL 136, HDL 51, and TG 113. Continue statin. Recheck lipid and liver function in 6-8 weeks.    For questions or updates, please contact [facility] Please consult [email or web address] for contact info under        Signed, , PA-C  06/22/2019, 11:30 AM

## 2019-06-22 NOTE — Plan of Care (Signed)
  Problem: Education: Goal: Knowledge of disease or condition will improve Outcome: Progressing Goal: Knowledge of secondary prevention will improve Outcome: Progressing Goal: Knowledge of patient specific risk factors addressed and post discharge goals established will improve Outcome: Progressing   Problem: Coping: Goal: Will verbalize positive feelings about self Outcome: Progressing Goal: Will identify appropriate support needs Outcome: Progressing   

## 2019-06-22 NOTE — Progress Notes (Signed)
Occupational Therapy Treatment Patient Details Name: Juan Hughes MRN: [id number] DOB: Feb 18, 1956 Today's Date: 06/22/2019    History of present illness Pt is 64 yo male PMH of HTN presented to ED 1/28 for L sided weakness. MRI+ for small acute lacunar infarct in the posterior right coronaradiata. No associated hemorrhage or mass effect. 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 OT utilizes [employer] number this date and is assisted for interpreting services by [place], [place] #[id number]. Pt is able to perform some aspects of LB dressing in sitting with CGA, requires MIN A for sit<>stand transfer. In standing, demos Fair static standing balance requiring CGA. Pt is able to perform grooming ADLs standing with CGA with RW for stability/support. OT educates about modified dressing and grooming techniques for both aspects of self care during this session and handout is issued with pictures for reference. In addition, pt engages in fxl mobility with RW out into hallway with Fair balance with RW requiring 1-2 standing rest breaks d/t R knee pain and CGA for safety. Overall, pt progressing well with OT. Anticipate HHOT will be appropriate as pt has declined to go to CIR d/t out of pocket cost but still requires services to ensure safety and stability with ADLs/ADL mobility.    Follow Up Recommendations  CIR (Per CSW note: pt has declined CIR d/t cost, would benefit from [facility] follow up to ensure safety with self care and assist with environmental modification as needed for fall prevention).   Equipment Recommendations  3 in 1 bedside commode    Recommendations for Other Services      Precautions / Restrictions Precautions Precautions: Fall Restrictions Weight Bearing  Restrictions: No       Mobility Bed Mobility Overal bed mobility: Modified Independent                Transfers Overall transfer level: Needs assistance Equipment used: Rolling walker (2 wheeled) Transfers: Sit to/from Stand Sit to Stand: Min assist              Balance Overall balance assessment: Needs assistance Sitting-balance support: No upper extremity supported;Feet supported Sitting balance-Leahy Scale: Good     Standing balance support: Bilateral upper extremity supported Standing balance-Leahy Scale: Fair Standing balance comment: with RW for UE support                           ADL either performed or assessed with clinical judgement   ADL Overall ADL's : Needs assistance/impaired     Grooming: Set up;Standing Grooming Details (indicate cue type and reason): with modified technique that OT educated pt about with handout issued re: modified grooming         Upper Body Dressing : Set up;Sitting Upper Body Dressing Details (indicate cue type and reason): with modified hemi dressing technique that OT educated pt about to don gown around backside                 Functional mobility during ADLs: Min guard;Rolling walker       Vision Patient Visual Report: No change from baseline     Perception     Praxis      Cognition Arousal/Alertness: Awake/alert Behavior During Therapy: WFL for tasks assessed/performed Overall Cognitive  Status: Within Functional Limits for tasks assessed                                          Exercises Other Exercises Other Exercises: OT facilitates education re: modified grooming tehcniques. OT issues handout to pt with pictures for grooming and dressing modification. Pt verbalized understanding through interpreter.   Shoulder Instructions       General Comments      Pertinent Vitals/ Pain       Pain Assessment: Faces Faces Pain Scale: Hurts little more Pain Location: RLE  knee Pain Descriptors / Indicators: Aching;Sore Pain Intervention(s): Monitored during session  Home Living                                          Prior Functioning/Environment              Frequency  Min 3X/week        Progress Toward Goals  OT Goals(current goals can now be found in the care plan section)  Progress towards OT goals: Progressing toward goals  Acute Rehab OT Goals Patient Stated Goal: return to PLOF OT Goal Formulation: With patient/family Time For Goal Achievement: 07/03/19  Plan      Co-evaluation                 AM-PAC OT "6 Clicks" Daily Activity     Outcome Measure   Help from another person eating meals?: None Help from another person taking care of personal grooming?: A Little Help from another person toileting, which includes using toliet, bedpan, or urinal?: A Little Help from another person bathing (including washing, rinsing, drying)?: A Little Help from another person to put on and taking off regular upper body clothing?: A Little Help from another person to put on and taking off regular lower body clothing?: A Little 6 Click Score: 19    End of Session Equipment Utilized During Treatment: Gait belt;Rolling walker  OT Visit Diagnosis: Other abnormalities of gait and mobility (R26.89);Hemiplegia and hemiparesis Hemiplegia - Right/Left: Left Hemiplegia - dominant/non-dominant: Non-Dominant Hemiplegia - caused by: Cerebral infarction   Activity Tolerance Patient tolerated treatment well   Patient Left in bed;with call bell/phone within reach   Nurse Communication Mobility status        Time: 7628-3151 OT Time Calculation (min): 38 min  Charges: OT General Charges $OT Visit: 1 Visit OT Treatments $Self Care/Home Management : 8-22 mins $Therapeutic Activity: 8-22 mins  Gerrianne Scale, [place], OTR/L ascom [phone] 06/22/19, 5:51 PM

## 2019-06-22 NOTE — Progress Notes (Signed)
Physical Therapy Treatment Patient Details Name: Juan Hughes MRN: [id number] DOB: [birth year] Today's Date: 06/22/2019    History of Present Illness Pt is 64 yo male PMH of HTN presented to ED 1/28 for L sided weakness. MRI+ for small acute lacunar infarct in the posterior right coronaradiata. No associated hemorrhage or mass effect. 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  BP running high this AM and checked prior to mobility.  159/96 P 68 Sats 98% on room air.  Pt ready to walk.  In/out of bed with supervision and self assist using BUE for LLE management in and out of bed.  Stood with min guard and was able to progress gait to 110' with RW and min assist.  Gait generally stady with good safety but increased dragging when fatigued.  R knee continues to be primary barrier with pain limiting distance along with fatigue.  Pt chose to return to bed after gait due to fatigue.  Limited further activity to increased BP.    Per notes, CIR does not seem to be an option for pt due to insurance issues.  While he remains appropriate, discharge home with HHPT and family support will be there likely discharge plan.  Recommend +1 assist for mobility for safety along with RW and 3-in-1 commode.    Follow Up Recommendations  CIR - see above.     Equipment Recommendations       Recommendations for Other Services       Precautions / Restrictions Precautions Precautions: Fall Restrictions Weight Bearing Restrictions: No    Mobility  Bed Mobility Overal bed mobility: Modified Independent             General bed mobility comments: uses BU to assist with LLE mobility in and out of bed  Transfers Overall transfer level: Needs assistance Equipment used: Rolling walker (2 wheeled) Transfers: Sit to/from Stand Sit to Stand: Min assist             Ambulation/Gait Ambulation/Gait assistance: Min assist Gait Distance (Feet): 110 Feet Assistive device: Rolling walker (2 wheeled) Gait Pattern/deviations: Step-through pattern;Decreased step length - right;Decreased step length - left;Decreased dorsiflexion - left;Decreased weight shift to left;Wide base of support Gait velocity: decreased   General Gait Details: gait limtied by fatigue and R knee/shoulder pain.  gait quality decreased with distance.   Stairs             Wheelchair Mobility    Modified Rankin (Stroke Patients Only)       Balance Overall balance assessment: Needs assistance Sitting-balance support: No upper extremity supported;Feet supported Sitting balance-Leahy Scale: Fair     Standing balance support: Bilateral upper extremity supported Standing balance-Leahy Scale: Fair Standing balance comment: during ambulation with RW;                            Cognition Arousal/Alertness: Awake/alert Behavior During Therapy: WFL for tasks assessed/performed Overall Cognitive Status: Within Functional Limits for tasks assessed                                        Exercises      General Comments        Pertinent Vitals/Pain Pain Assessment: Faces Faces Pain Scale: Hurts little  more Pain Location: RLE knee Pain Descriptors / Indicators: Aching;Sore Pain Intervention(s): Limited activity within patient's tolerance    Home Living                      Prior Function            PT Goals (current goals can now be found in the care plan section) Progress towards PT goals: Progressing toward goals    Frequency    7X/week      PT Plan Current plan remains appropriate    Co-evaluation              AM-PAC PT "6 Clicks" Mobility   Outcome Measure  Help needed turning from your back to your side while in a flat bed without using bedrails?: None Help needed moving from lying on your back to  sitting on the side of a flat bed without using bedrails?: None Help needed moving to and from a bed to a chair (including a wheelchair)?: A Little Help needed standing up from a chair using your arms (e.g., wheelchair or bedside chair)?: A Little Help needed to walk in hospital room?: A Little Help needed climbing 3-5 steps with a railing? : A Lot 6 Click Score: 19    End of Session Equipment Utilized During Treatment: Gait belt Activity Tolerance: Patient tolerated treatment well;Patient limited by pain Patient left: in bed;with call bell/phone within reach;with bed alarm set Nurse Communication: Mobility status Hemiplegia - Right/Left: Left Hemiplegia - dominant/non-dominant: Non-dominant Hemiplegia - caused by: Cerebral infarction     Time: 1150-1207 PT Time Calculation (min) (ACUTE ONLY): 17 min  Charges:  $Gait Training: 8-22 mins                    Chesley Noon, PTA 06/22/19, 12:47 PM

## 2019-06-22 NOTE — Progress Notes (Signed)
Inpatient Rehabilitation-Admissions Coordinator   Spoke with pt via phone with Spanish interpreter Ashby Dawes present. Discussed recommended IP Rehab program, anticipated LOS, and benefits of the program. Unfortunately, pt is uninsured and when hearing about the potential daily cost of care, has decided he does not want to pursue this program. Hopeful that pt would receive charity care for [facility] services as pt would be a great rehab candidate. AC will sign off as pt has declined offer for CIR.   AC will notify TOC team.   Cheri Rous, OTR/L  Rehab Admissions Coordinator  [phone] 06/22/2019 11:48 AM

## 2019-06-22 NOTE — TOC Progression Note (Signed)
Transition of Care [facility]) - Progression Note    Patient Details  Name: Juan Hughes MRN: [id number] Date of Birth: [birth year]  Transition of Care [facility]) CM/SW [place], RN Phone Number: 06/22/2019, 2:49 PM  Clinical Narrative:   I met with the patient with the Juan Hughes The patient has declined CIR due to the cost, he has no insurance and no PCP, He needs HH PT, I set him up with [facility] thru [place], He needs a RW and I got that thru [employer].  [facility] needs [facility] and [facility], I provided him a spanish application and sent the [facility] to [facility], I also explained that he can get the medications thru [facility] as long as he fills out the appropriate provided application and took to them and has no insurance.         Expected Discharge Plan and Services                                                 Social Determinants of Health (SDOH) Interventions    Readmission Risk Interventions No flowsheet data found.

## 2019-06-22 NOTE — Progress Notes (Signed)
PROGRESS NOTE    Juan Hughes  XLK:[id number] DOB: May 10, 1956 DOA: 06/18/2019 PCP: Patient, No Pcp Per      Brief Narrative:  Juan Hughes is a 64 y.o. Hispanic M with hx HTN untreated who presented with acute left hemiparesis.  Around 2 AM the morning of admission, the patient noticed left-sided weakness in the arm and leg without slurred speech or facial droop.  Presented to the ER around 11 AM, still with left hemiparesis.  Code stroke called, CT head unremarkable, neurology were consulted.   Assessment & Plan:  Acute stroke Patient admitted with new left hemiparesis.  MRI brain showed Small acute lacunar infarct in the posterior right corona radiata -Non-invasive angiography showed vertebral artery occlusion with collateral flow -Echocardiogram ordered, showed no cardiac source of embolism -Carotid imaging unremarkable   -Lipids ordered: LDL 135, continue atorvastatin 80 nightly -Aspirin ordered at admission --> continue aspirin 325 -Atrial fibrillation: not present on tele to date -tPA not given because outside window at time of presentation -Dysphagia screen ordered in ER -PT eval ordered: recommending CIR -Smoking cessation: Recommended, modalities discussed --> continue wellbutrin (new)   New onset LV systolic dysfunction Patient admitted for stroke, found incidentally on echocardiogram-EF 40 to 45%.  The initial read suggested a regional wall motion abnormality, cardiology were consulted, doubt acute infarct, recommend outpatient ischemic evaluation No evidence of congestive heart failure.  --Losartan 100, Coreg 6.25 twice daily Increase HCTZ to 50, add hydralazine -Avoid amlodipine -stress test tomorrow   Hypertensive urgency Initially were allowing permissive hypertension.  MRI showed small vessel disease due to hypertension.  Discussed with neurology. Losartan100, Coreg 6.25 twice daily Increase HCTZ to 50, add hydralazine  Hypokalemia Magnesium  normal -Supplement potassium  Osteoarthritis Patient takes Aleve twice daily about 3 to 4 days/week for the last several years to treat osteoarthritis of the bilateral knees.  At present he has severe right knee pain and right shoulder pain when he is walking due to heavy reliance on them given his left-sided hemiparesis.  NSAID use is less than optimal in a patient with recent stroke, new onset LV dysfunction. -Schedule Tylenol -Trial low-dose tramadol   DVT prophylaxis: Lovenox Code Status: FULL Family Communication: not today Disposition: Home tomorrow with HHPT after stress test.   Consultants:   Neurology  Cardiology  Procedures:   1/28 MRI brain / MRA head -- right corona radiata infarction, possible occlusion of the cervical right vertebral artery  1/29 US carotid -- no blockages  1/29 echo cardiogram --reduced EF, no cardiogenic source of embolism  2/1 nuclear cardiac perfusion imaging-pending  Antimicrobials:   None   Culture data:   None     Subjective: All history collected through video phonic interpreter.  Pt reported his strength on the left side had improved, and he was able to walk with a walker with PT.  No fever, dyspnea, chest pain, abdominal pain, N/V/D.  Eating and drinking ok.  BP was still severely elevated this morning despite being on 3 BP meds, so stress test wasn't able to be performed.   Objective: Vitals:   06/22/19 0707 06/22/19 1122 06/22/19 1128 06/22/19 1520  BP: (!) 188/100 (!) 158/96 (!) 159/95 (!) 162/94  Pulse: 68 67 68 68  Resp: 18     Temp: 98.7 F (37.1 C) 98.4 F (36.9 C)  97.8 F (36.6 C)  TempSrc: Oral Oral  Oral  SpO2: 98% 94%  98%  Weight:      Height:  Intake/Output Summary (Last 24 hours) at 06/22/2019 1640 Last data filed at 06/22/2019 1500 Gross per 24 hour  Intake 610 ml  Output 500 ml  Net 110 ml   Filed Weights   06/18/19 1111  Weight: 68 kg    Examination: Constitutional: NAD,  AAOx3 HEENT: conjunctivae and lids normal, EOMI CV: RRR no M,R,G. Distal pulses +2.  No cyanosis.   RESP: CTA B/L, normal respiratory effort  GI: +BS, NTND Extremities: No effusions, edema, or tenderness in BLE SKIN: warm, dry and intact Neuro: II - XII grossly intact.  Sensation intact Psych: Normal mood and affect.  Appropriate judgement and reason    Data Reviewed: I have personally reviewed following labs and imaging studies:  CBC: Recent Labs  Lab 06/18/19 1129 06/20/19 0629 06/22/19 0915  WBC 6.7 6.5 7.0  NEUTROABS 5.0  --   --   HGB 15.7 14.2 15.7  HCT 48.2 44.3 48.3  MCV 86.1 87.4 86.6  PLT 209 196 841   Basic Metabolic Panel: Recent Labs  Lab 06/18/19 1129 06/19/19 1500 06/20/19 0629 06/21/19 0501 06/22/19 0915  NA 142  --  144 143 139  K 2.9*  --  3.2* 3.4* 3.9  CL 105  --  110 109 105  CO2 27  --  24 27 23   GLUCOSE 106*  --  92 90 104*  BUN 19  --  21 20 25*  CREATININE 1.05  --  1.05 1.07 1.17  CALCIUM 9.5  --  9.0 9.5 9.7  MG  --  1.8  --   --  2.0   GFR: Estimated Creatinine Clearance: 53.6 mL/min (by C-G formula based on SCr of 1.17 mg/dL). Liver Function Tests: Recent Labs  Lab 06/18/19 1129 06/20/19 0629  AST 16 15  ALT 12 11  ALKPHOS 75 61  BILITOT 0.9 1.0  PROT 7.4 6.4*  ALBUMIN 4.1 3.8   No results for input(s): LIPASE, AMYLASE in the last 168 hours. No results for input(s): AMMONIA in the last 168 hours. Coagulation Profile: Recent Labs  Lab 06/18/19 1129  INR 0.9   Cardiac Enzymes: No results for input(s): CKTOTAL, CKMB, CKMBINDEX, TROPONINI in the last 168 hours. BNP (last 3 results) No results for input(s): PROBNP in the last 8760 hours. HbA1C: No results for input(s): HGBA1C in the last 72 hours. CBG: Recent Labs  Lab 06/18/19 1129 06/18/19 1202  GLUCAP 99 98   Lipid Profile: No results for input(s): CHOL, HDL, LDLCALC, TRIG, CHOLHDL, LDLDIRECT in the last 72 hours. Thyroid Function Tests: Recent Labs     06/22/19 0915  TSH 1.997   Anemia Panel: No results for input(s): VITAMINB12, FOLATE, FERRITIN, TIBC, IRON, RETICCTPCT in the last 72 hours. Urine analysis: No results found for: COLORURINE, APPEARANCEUR, LABSPEC, PHURINE, GLUCOSEU, HGBUR, BILIRUBINUR, KETONESUR, PROTEINUR, UROBILINOGEN, NITRITE, LEUKOCYTESUR Sepsis Labs: @LABRCNTIP (procalcitonin:4,lacticacidven:4)  ) Recent Results (from the past 240 hour(s))  Respiratory Panel by RT PCR (Flu A&B, Covid) - Nasopharyngeal Swab     Status: None   Collection Time: 06/18/19 12:18 PM   Specimen: Nasopharyngeal Swab  Result Value Ref Range Status   SARS Coronavirus 2 by RT PCR NEGATIVE NEGATIVE Final    Comment: (NOTE) SARS-CoV-2 target nucleic acids are NOT DETECTED. The SARS-CoV-2 RNA is generally detectable in upper respiratoy specimens during the acute phase of infection. The lowest concentration of SARS-CoV-2 viral copies this assay can detect is 131 copies/mL. A negative result does not preclude SARS-Cov-2 infection and should not be used as  the sole basis for treatment or other patient management decisions. A negative result may occur with  improper specimen collection/handling, submission of specimen other than nasopharyngeal swab, presence of viral mutation(s) within the areas targeted by this assay, and inadequate number of viral copies (<131 copies/mL). A negative result must be combined with clinical observations, patient history, and epidemiological information. The expected result is Negative. Fact Sheet for Patients:  https://www.moore.com/ Fact Sheet for Healthcare Providers:  https://www.young.biz/ This test is not yet ap proved or cleared by the [place] FDA and  has been authorized for detection and/or diagnosis of SARS-CoV-2 by FDA under an Emergency Use Authorization (EUA). This EUA will remain  in effect (meaning this test can be used) for the duration of the COVID-19  declaration under Section 564(b)(1) of the Act, 21 U.S.C. section 360bbb-3(b)(1), unless the authorization is terminated or revoked sooner.    Influenza A by PCR NEGATIVE NEGATIVE Final   Influenza B by PCR NEGATIVE NEGATIVE Final    Comment: (NOTE) The Xpert Xpress SARS-CoV-2/FLU/RSV assay is intended as an aid in  the diagnosis of influenza from Nasopharyngeal swab specimens and  should not be used as a sole basis for treatment. Nasal washings and  aspirates are unacceptable for Xpert Xpress SARS-CoV-2/FLU/RSV  testing. Fact Sheet for Patients: https://www.moore.com/ Fact Sheet for Healthcare Providers: https://www.young.biz/ This test is not yet approved or cleared by the [place] FDA and  has been authorized for detection and/or diagnosis of SARS-CoV-2 by  FDA under an Emergency Use Authorization (EUA). This EUA will remain  in effect (meaning this test can be used) for the duration of the  Covid-19 declaration under Section 564(b)(1) of the Act, 21  U.S.C. section 360bbb-3(b)(1), unless the authorization is  terminated or revoked. Performed at [facility], [address] [place], [place] [address]          Radiology Studies: No results found.      Scheduled Meds: .  stroke: mapping our early stages of recovery book   Does not apply Once  . acetaminophen  1,000 mg Oral TID  . aspirin  300 mg Rectal Daily   Or  . aspirin EC  325 mg Oral Daily  . atorvastatin  80 mg Oral q1800  . carvedilol  6.25 mg Oral BID WC  . enoxaparin (LOVENOX) injection  40 mg Subcutaneous Q24H  . feeding supplement (ENSURE ENLIVE)  237 mL Oral BID BM  . hydrALAZINE  50 mg Oral Q8H  . hydrochlorothiazide  50 mg Oral Daily  . losartan  100 mg Oral Daily  . multivitamin with minerals  1 tablet Oral Daily  . nicotine  21 mg Transdermal Daily  . sodium chloride flush  10 mL Intravenous Q12H  . sodium chloride flush  3 mL Intravenous  Once   Continuous Infusions:   LOS: 3 days     Darlin Priestly, MD [facility] 06/22/2019, 4:40 PM     Please page though Loretha Stapler or Epic secure chat:  For [employer], Higher education careers adviser

## 2019-06-23 ENCOUNTER — Inpatient Hospital Stay (HOSPITAL_COMMUNITY): Payer: Self-pay

## 2019-06-23 DIAGNOSIS — I428 Other cardiomyopathies: Secondary | ICD-10-CM

## 2019-06-23 DIAGNOSIS — I639 Cerebral infarction, unspecified: Secondary | ICD-10-CM

## 2019-06-23 DIAGNOSIS — I5020 Unspecified systolic (congestive) heart failure: Secondary | ICD-10-CM

## 2019-06-23 LAB — CBC
HCT: 45.6 % (ref 39.0–52.0)
Hemoglobin: 14.9 g/dL (ref 13.0–17.0)
MCH: 28.4 pg (ref 26.0–34.0)
MCHC: 32.7 g/dL (ref 30.0–36.0)
MCV: 86.9 fL (ref 80.0–100.0)
Platelets: 191 10*3/uL (ref 150–400)
RBC: 5.25 MIL/uL (ref 4.22–5.81)
RDW: 13.7 % (ref 11.5–15.5)
WBC: 6.7 10*3/uL (ref 4.0–10.5)
nRBC: 0 % (ref 0.0–0.2)

## 2019-06-23 LAB — BASIC METABOLIC PANEL
Anion gap: 11 (ref 5–15)
BUN: 28 mg/dL — ABNORMAL HIGH (ref 8–23)
CO2: 25 mmol/L (ref 22–32)
Calcium: 9.6 mg/dL (ref 8.9–10.3)
Chloride: 103 mmol/L (ref 98–111)
Creatinine, Ser: 1.09 mg/dL (ref 0.61–1.24)
GFR calc Af Amer: >60 mL/min (ref >60)
GFR calc non Af Amer: >60 mL/min (ref >60)
Glucose, Bld: 108 mg/dL — ABNORMAL HIGH (ref 70–99)
Potassium: 3.6 mmol/L (ref 3.5–5.1)
Sodium: 139 mmol/L (ref 135–145)

## 2019-06-23 LAB — NM MYOCAR MULTI W/SPECT W/WALL MOTION / EF
Estimated workload: 2 METS
Exercise duration (min): 0 min
Exercise duration (sec): 0 s
LV dias vol: 129 mL (ref 62–150)
LV sys vol: 83 mL
MPHR: 159 {beats}/min
Peak HR: 102 {beats}/min
Percent HR: 64 %
Rest HR: 78 {beats}/min
TID: 1.1

## 2019-06-23 LAB — MAGNESIUM: Magnesium: 2 mg/dL (ref 1.7–2.4)

## 2019-06-23 MED ORDER — HYDROCHLOROTHIAZIDE 50 MG PO TABS: 50.0000 mg | ORAL_TABLET | Freq: Every day | ORAL | 2 refills | Status: DC

## 2019-06-23 MED ORDER — LOSARTAN POTASSIUM 100 MG PO TABS: 100.0000 mg | ORAL_TABLET | Freq: Every day | ORAL | 2 refills | Status: DC

## 2019-06-23 MED ORDER — ASPIRIN 325 MG PO TBEC: 325.0000 mg | DELAYED_RELEASE_TABLET | Freq: Every day | ORAL | 0 refills | Status: DC

## 2019-06-23 MED ORDER — HYDRALAZINE HCL 50 MG PO TABS
100.0000 mg | ORAL_TABLET | Freq: Three times a day (TID) | ORAL | Status: DC
  Administered 2019-06-23 (×2): 100 mg via ORAL
  Filled 2019-06-23 (×2): qty 2

## 2019-06-23 MED ORDER — AMINOPHYLLINE 25 MG/ML IV (NUC MED)
75.0000 mg | Freq: Once | INTRAVENOUS | Status: DC
  Filled 2019-06-23: qty 3

## 2019-06-23 MED ORDER — TECHNETIUM TC 99M TETROFOSMIN IV KIT
10.0000 | PACK | Freq: Once | INTRAVENOUS | Status: AC | PRN
  Administered 2019-06-23: 10.798 via INTRAVENOUS

## 2019-06-23 MED ORDER — ATORVASTATIN CALCIUM 80 MG PO TABS: 80.0000 mg | ORAL_TABLET | Freq: Every day | ORAL | 2 refills | Status: DC

## 2019-06-23 MED ORDER — CARVEDILOL 6.25 MG PO TABS: 6.2500 mg | ORAL_TABLET | Freq: Two times a day (BID) | ORAL | 2 refills | Status: DC

## 2019-06-23 MED ORDER — STROKE: EARLY STAGES OF RECOVERY BOOK: 1.0000 | Freq: Once | Status: AC

## 2019-06-23 MED ORDER — REGADENOSON 0.4 MG/5ML IV SOLN
0.4000 mg | Freq: Once | INTRAVENOUS | Status: AC
  Administered 2019-06-23: 0.4 mg via INTRAVENOUS

## 2019-06-23 MED ORDER — HYDRALAZINE HCL 100 MG PO TABS: 100.0000 mg | ORAL_TABLET | Freq: Three times a day (TID) | ORAL | 2 refills | Status: DC

## 2019-06-23 MED ORDER — TECHNETIUM TC 99M TETROFOSMIN IV KIT
30.0000 | PACK | Freq: Once | INTRAVENOUS | Status: AC | PRN
  Administered 2019-06-23: 31.24 via INTRAVENOUS

## 2019-06-23 MED ORDER — CARVEDILOL 6.25 MG PO TABS
6.2500 mg | ORAL_TABLET | Freq: Two times a day (BID) | ORAL | Status: DC
  Administered 2019-06-23 (×2): 6.25 mg via ORAL
  Filled 2019-06-23 (×2): qty 1
  Filled 2019-06-23 (×2): qty 2

## 2019-06-23 NOTE — Progress Notes (Signed)
   NM Myoview performed today with starting SBP 140s. Following administration of regadenoson, he experienced several minutes of coughing and dry heaving 2/2 nausea.  Telemetry showed bradycardic rate and drop in systolic pressure, likely 2/2 vasovagal etiology. Patient was asymptomatic. When SBP 60s, aminophylline 75 mg was administered with saline flush for reversal of Lexiscan with subsequent improvement in pressures.    Patient remained asymptomatic during the above other than his nausea, stomach pressure, and SOB 2/2 Lexiscan. He denied any associated presyncope/dizziness during this event.   If BP / HR remains low later today, consider holding his PM dose of Coreg.  Signed, Lennon Alstrom, PA-C 06/23/2019, 12:35 PM Pager [phone]

## 2019-06-23 NOTE — Progress Notes (Signed)
Physical Therapy Treatment Patient Details Name: Juan Hughes MRN: [id number] DOB: 1955-11-16 Today's Date: 06/23/2019    History of Present Illness Pt is 64 yo male PMH of HTN presented to ED 1/28 for L sided weakness. MRI+ for small acute lacunar infarct in the posterior right coronaradiata. No associated hemorrhage or mass effect. possible occlusion of the cervical right vertebral artery, supplied in a retrograde fashion from the vertebrobasilar junction    PT Comments    Pt was supine in bed upon arriving.  Agrees to PT session and is cooperative and pleasant throughout.Supervision only required to supine > sit EOB. Pt exited R side of bed and sat EOB x 5 minutes prior to ambulation with RW. BP seated EOB 158/85. He states he feels well and agrees to ambulation. Pt ambulated 100 ft with RW + gait belt for safety with CGA. Distances limited by fatigue. He tolerated well but requested to return to bed post gait training. Pt BP once repositioned in bed post ambulation 168/108. RN notified. Bed alarm set and call bell in reach. Pt is progressing well with PT. Will continue to follow per current POC.    Follow Up Recommendations  CIR;Other (comment)(Pt plans to d/c home with 24 hour supervision)     Equipment Recommendations  Rolling walker with 5" wheels    Recommendations for Other Services       Precautions / Restrictions Precautions Precautions: Fall Restrictions Weight Bearing Restrictions: No    Mobility  Bed Mobility Overal bed mobility: Modified Independent             General bed mobility comments: Pt demonstarted ability to safely exit R side of bed ( HOB flat) with supervision. Vcs only for improved technique and sequencing to decrease strain on back  Transfers Overall transfer level: Needs assistance Equipment used: Rolling walker (2 wheeled) Transfers: Sit to/from Stand Sit to Stand: Min guard         General transfer comment: Pt BP at EOB 158/85  and reports no symptoms of distress. HR 70 and O2 98%. He was able to stand from EOB with bed height slightly elevated with CGA for safety. RW for support  Ambulation/Gait Ambulation/Gait assistance: Min guard Gait Distance (Feet): 100 Feet Assistive device: Rolling walker (2 wheeled) Gait Pattern/deviations: Step-through pattern;Decreased step length - right;Decreased step length - left;Decreased dorsiflexion - left;Decreased weight shift to left;Wide base of support Gait velocity: decreased   General Gait Details: Gait distances limited by fatigue however pt tolerated well without LOB or unsteadiness. continues to present with L side weakness moreso in LE then UE.    Stairs             Wheelchair Mobility    Modified Rankin (Stroke Patients Only)       Balance Overall balance assessment: Needs assistance Sitting-balance support: Feet supported;No upper extremity supported Sitting balance-Leahy Scale: Good Sitting balance - Comments: pt able to sit EOB x 5 minutes without LOB noted   Standing balance support: Bilateral upper extremity supported Standing balance-Leahy Scale: Good Standing balance comment: with RW for UE support                            Cognition Arousal/Alertness: Awake/alert Behavior During Therapy: WFL for tasks assessed/performed Overall Cognitive Status: Within Functional Limits for tasks assessed  General Comments: pt is A and O      Exercises      General Comments        Pertinent Vitals/Pain Pain Assessment: No/denies pain Pain Score: 0-No pain Faces Pain Scale: No hurt    Home Living                      Prior Function            PT Goals (current goals can now be found in the care plan section) Acute Rehab PT Goals Patient Stated Goal: " I want to go home" " I did not sleep well last night" Progress towards PT goals: Progressing toward goals     Frequency    7X/week      PT Plan Current plan remains appropriate    Co-evaluation     PT goals addressed during session: Mobility/safety with mobility;Strengthening/ROM;Balance        AM-PAC PT "6 Clicks" Mobility   Outcome Measure  Help needed turning from your back to your side while in a flat bed without using bedrails?: None Help needed moving from lying on your back to sitting on the side of a flat bed without using bedrails?: None Help needed moving to and from a bed to a chair (including a wheelchair)?: A Little Help needed standing up from a chair using your arms (e.g., wheelchair or bedside chair)?: A Little Help needed to walk in hospital room?: A Little Help needed climbing 3-5 steps with a railing? : A Little 6 Click Score: 20    End of Session Equipment Utilized During Treatment: Gait belt Activity Tolerance: Patient tolerated treatment well Patient left: in bed;with bed alarm set;with call bell/phone within reach;Other (comment)(returned to bed per Pt request) Nurse Communication: Mobility status PT Visit Diagnosis: Other abnormalities of gait and mobility (R26.89);Muscle weakness (generalized) (M62.81);Hemiplegia and hemiparesis;Difficulty in walking, not elsewhere classified (R26.2) Hemiplegia - Right/Left: Left Hemiplegia - dominant/non-dominant: Non-dominant Hemiplegia - caused by: Cerebral infarction     Time: 0902-0926 PT Time Calculation (min) (ACUTE ONLY): 24 min  Charges:  $Gait Training: 8-22 mins $Therapeutic Activity: 8-22 mins                     Julaine Fusi PTA 06/23/19, 10:12 AM

## 2019-06-23 NOTE — Plan of Care (Signed)
  Problem: Education: Goal: Knowledge of disease or condition will improve Outcome: Progressing Goal: Knowledge of secondary prevention will improve Outcome: Progressing Goal: Knowledge of patient specific risk factors addressed and post discharge goals established will improve Outcome: Progressing   Problem: Coping: Goal: Will verbalize positive feelings about self Outcome: Progressing Goal: Will identify appropriate support needs Outcome: Progressing   Problem: Health Behavior/Discharge Planning: Goal: Ability to manage health-related needs will improve Outcome: Progressing   Problem: Self-Care: Goal: Ability to participate in self-care as condition permits will improve Outcome: Progressing Goal: Verbalization of feelings and concerns over difficulty with self-care will improve Outcome: Progressing Goal: Ability to communicate needs accurately will improve Outcome: Progressing   Problem: Nutrition: Goal: Risk of aspiration will decrease Outcome: Progressing Goal: Dietary intake will improve Outcome: Progressing   Problem: Ischemic Stroke/TIA Tissue Perfusion: Goal: Complications of ischemic stroke/TIA will be minimized Outcome: Progressing   Problem: Education: Goal: Knowledge of General Education information will improve Description: Including pain rating scale, medication(s)/side effects and non-pharmacologic comfort measures Outcome: Progressing   Problem: Health Behavior/Discharge Planning: Goal: Ability to manage health-related needs will improve Outcome: Progressing   Problem: Clinical Measurements: Goal: Ability to maintain clinical measurements within normal limits will improve Outcome: Progressing Goal: Will remain free from infection Outcome: Progressing Goal: Diagnostic test results will improve Outcome: Progressing Goal: Respiratory complications will improve Outcome: Progressing Goal: Cardiovascular complication will be avoided Outcome:  Progressing   Problem: Clinical Measurements: Goal: Ability to maintain clinical measurements within normal limits will improve Outcome: Progressing Goal: Will remain free from infection Outcome: Progressing Goal: Diagnostic test results will improve Outcome: Progressing Goal: Respiratory complications will improve Outcome: Progressing Goal: Cardiovascular complication will be avoided Outcome: Progressing   Problem: Activity: Goal: Risk for activity intolerance will decrease Outcome: Progressing   Problem: Nutrition: Goal: Adequate nutrition will be maintained Outcome: Progressing   Problem: Coping: Goal: Level of anxiety will decrease Outcome: Progressing   Problem: Elimination: Goal: Will not experience complications related to bowel motility Outcome: Progressing Goal: Will not experience complications related to urinary retention Outcome: Progressing   Problem: Pain Managment: Goal: General experience of comfort will improve Outcome: Progressing   Problem: Skin Integrity: Goal: Risk for impaired skin integrity will decrease Outcome: Progressing

## 2019-06-23 NOTE — Progress Notes (Signed)
Progress Note  Patient Name: Juan Hughes Date of Encounter: 06/23/2019  Primary Cardiologist: [facility], Dr. Kirke Corin rounding  Subjective   He denies chest pain, racing heart rate, palpitations, and shortness of breath today.  BP better controlled this AM as he received all of his medications in the AM prior to his stress.  Inpatient Medications    Scheduled Meds: .  stroke: mapping our early stages of recovery book   Does not apply Once  . acetaminophen  1,000 mg Oral TID  . aspirin  300 mg Rectal Daily   Or  . aspirin EC  325 mg Oral Daily  . atorvastatin  80 mg Oral q1800  . carvedilol  6.25 mg Oral BID WC  . enoxaparin (LOVENOX) injection  40 mg Subcutaneous Q24H  . feeding supplement (ENSURE ENLIVE)  237 mL Oral BID BM  . hydrALAZINE  100 mg Oral Q8H  . hydrochlorothiazide  50 mg Oral Daily  . losartan  100 mg Oral Daily  . multivitamin with minerals  1 tablet Oral Daily  . nicotine  21 mg Transdermal Daily  . sodium chloride flush  10 mL Intravenous Q12H  . sodium chloride flush  3 mL Intravenous Once   Continuous Infusions:  PRN Meds: senna-docusate, traMADol   Vital Signs    Vitals:   06/22/19 2038 06/22/19 2336 06/23/19 0344 06/23/19 0822  BP: (!) 160/86 (!) 156/83 (!) 156/89 (!) 163/95  Pulse: 67 69 77 72  Resp:  16 16 17   Temp: 97.6 F (36.4 C) 97.6 F (36.4 C) 97.9 F (36.6 C) (!) 97.5 F (36.4 C)  TempSrc:  Oral Oral Oral  SpO2: 97% 98% 96% 97%  Weight:      Height:        Intake/Output Summary (Last 24 hours) at 06/23/2019 1053 Last data filed at 06/23/2019 08/21/2019 Gross per 24 hour  Intake 120 ml  Output 500 ml  Net -380 ml   Last 3 Weights 06/18/2019  Weight (lbs) 150 lb  Weight (kg) 68.04 kg      Telemetry    SR, PVCs (as seen on telemetry during stress test)- Personally Reviewed  ECG    No new tracings - Personally Reviewed  Physical Exam   GEN: No acute distress. Seen prior to stress test.  Neck: No JVD Cardiac: RRR,  no murmurs, rubs, or gallops.  Respiratory: Clear to auscultation bilaterally. GI: Soft, nontender, non-distended  MS: No edema; No deformity.  Neuro:  Nonfocal  Psych: Normal affect   Labs    High Sensitivity Troponin:  No results for input(s): TROPONINIHS in the last 720 hours.    Cardiac EnzymesNo results for input(s): TROPONINI in the last 168 hours. No results for input(s): TROPIPOC in the last 168 hours.   Chemistry Recent Labs  Lab 06/18/19 1129 06/18/19 1129 06/20/19 0629 06/20/19 0629 06/21/19 0501 06/22/19 0915 06/23/19 0519  NA 142   < > 144   < > 143 139 139  K 2.9*   < > 3.2*   < > 3.4* 3.9 3.6  CL 105   < > 110   < > 109 105 103  CO2 27   < > 24   < > 27 23 25   GLUCOSE 106*   < > 92   < > 90 104* 108*  BUN 19   < > 21   < > 20 25* 28*  CREATININE 1.05   < > 1.05   < >  1.07 1.17 1.09  CALCIUM 9.5   < > 9.0   < > 9.5 9.7 9.6  PROT 7.4  --  6.4*  --   --   --   --   ALBUMIN 4.1  --  3.8  --   --   --   --   AST 16  --  15  --   --   --   --   ALT 12  --  11  --   --   --   --   ALKPHOS 75  --  61  --   --   --   --   BILITOT 0.9  --  1.0  --   --   --   --   GFRNONAA >60   < > >60   < > >60 >60 >60  GFRAA >60   < > >60   < > >60 >60 >60  ANIONGAP 10   < > 10   < > 7 11 11    < > = values in this interval not displayed.     Hematology Recent Labs  Lab 06/20/19 0629 06/22/19 0915 06/23/19 0519  WBC 6.5 7.0 6.7  RBC 5.07 5.58 5.25  HGB 14.2 15.7 14.9  HCT 44.3 48.3 45.6  MCV 87.4 86.6 86.9  MCH 28.0 28.1 28.4  MCHC 32.1 32.5 32.7  RDW 13.7 13.5 13.7  PLT 196 213 191    BNPNo results for input(s): BNP, PROBNP in the last 168 hours.   DDimer No results for input(s): DDIMER in the last 168 hours.   Radiology    No results found.  Cardiac Studies   TTE 06/18/2018 1. Left ventricular ejection fraction, by visual estimation, is 40 to  45%. The left ventricle has moderately decreased function. There is  severely increased left ventricular  hypertrophy.  2. Left ventricular diastolic parameters are consistent with Grade I  diastolic dysfunction (impaired relaxation).  3. Small to moderate myocardial infarction.  4. The left ventricle has no regional wall motion abnormalities.  5. Global right ventricle has moderately reduced systolic function.The  right ventricular size is moderately enlarged. Mildly increased right  ventricular wall thickness.  6. Left atrial size was moderately dilated.  7. Right atrial size was moderately dilated.  8. The mitral valve is normal in structure. Mild mitral valve  regurgitation. No evidence of mitral stenosis.  9. The tricuspid valve is normal in structure.  10. The tricuspid valve is normal in structure. Tricuspid valve  regurgitation is mild.  11. The aortic valve is normal in structure. Aortic valve regurgitation is  not visualized. Mild aortic valve sclerosis without stenosis.  12. The pulmonic valve was normal in structure. Pulmonic valve  regurgitation is not visualized.  13. Mildly elevated pulmonary artery systolic pressure.  14. The inferior vena cava is normal in size with greater than 50%  respiratory variability, suggesting right atrial pressure of 3 mmHg.   Patient Profile     64 y.o. male with no previously known cardiac history and new HFrEF with reduced EF 40-45%, HTN, tobacco use, recent CVA, and L sided weakness, and recently found to have R lacunar infarct and chronic small vessel dz on MRI.   Assessment & Plan    HFrEF with mild to moderately reduced ejection fraction (EF 40 to 45%, 1/31) --No SOB or DOE.  No chest pain at rest.  Newly found reduced EF with echo as above suspicion that 2/2 uncontrolled hypertension.  Cannot completely rule out cardiac ischemia as etiology of reduced EF, however, and thus NM Lexiscan performed today to further risk stratify.  --Lipid panel checked with LDL 136 as below. Mg at goal. A1C wnl. TSH 1.997. Risk factors for cardiac  etiology include HLD and tobacco use, as well as age. --Continue current losartan, Coreg, and HCTZ for BP control. Continue to monitor I's/O's, daily standing weights. Daily BMET. --Future recommendations pending Myoview.  HTN --BP better controlled on antihypertensives. Continue to titrate antihypertensives for BP control.    CVA --Followed by neurology for R lacunar infarct and chronic small vessel dz on MRI.  Reports ongoing L sided weakness. Working with physical therapy.  Per neurology and primary care team. Smoking cessation recommended, as well as ongoing ASA and statin.    Current tobacco use --On nicotine patch.  Smoking cessation recommended for risk factor modification.  HLD --Lipid panel checked with cholesterol 210, LDL 136, HDL 51, and TG 113. Continue statin. Recheck lipid and liver function in 6-8 weeks.    For questions or updates, please contact [facility] Please consult [email or web address] for contact info under        Signed, Lennon Alstrom, PA-C  06/23/2019, 10:53 AM

## 2019-06-23 NOTE — Progress Notes (Signed)
Patient discharged home per MD order. Interpreter used for discharge instructions. All discharge instructions given and all questions answered. Prescriptions given to patient.

## 2019-06-24 ENCOUNTER — Telehealth: Payer: Self-pay | Admitting: Cardiovascular Disease

## 2019-06-24 MED ORDER — CARVEDILOL 6.25 MG PO TABS: 6.2500 mg | ORAL_TABLET | Freq: Two times a day (BID) | ORAL | 2 refills | Status: DC

## 2019-06-24 MED ORDER — ATORVASTATIN CALCIUM 80 MG PO TABS: 80.0000 mg | ORAL_TABLET | Freq: Every day | ORAL | 2 refills | Status: DC

## 2019-06-24 NOTE — Addendum Note (Signed)
Addended by: Jarvis Newcomer on: 06/24/2019 04:01 PM   Modules accepted: Orders

## 2019-06-24 NOTE — Telephone Encounter (Addendum)
Spoke with the patients wife. Patient wife sts that she was not able to get to prescriptions filled after the patients hospital d/c. They were given a written prescription for Carvedilol 6.25mg  bid and Atorvastatin 80 mg qd. She was told that the "miligrams were to high" and the pharmacy would not fill.  Advised the pt wife that the pt should be on the medications.  E-Scribed an Rx to [facility] for both medications. Spoke with the pharmacy, they have received the e-scribed Rx's and are currently processing. Per the pharmacy rep there does not seem to be an issue.   Patient made aware of above, Jearld Adjutant, CMA helped with the translation.

## 2019-06-24 NOTE — Telephone Encounter (Signed)
Looks like this will be a patient of Dr. Azucena Cecil not Dr. Kirke Corin, as Dr. Azucena Cecil did the in hospital consult. He is scheduled to see Marisue Ivan, PA on 07/08/19. She is familiar with the patient and rounded in him during his recent hospitalization. Not able to access the patient d/c summary. Message fwd to [facility] to advise.

## 2019-06-24 NOTE — Telephone Encounter (Signed)
Pt c/o medication issue:  1. Name of Medication: atorvastatin and carvedilol   2. How are you currently taking this medication (dosage and times per day)? Last dose at hospital on 2/2   3. Are you having a reaction (difficulty breathing--STAT)? no  4. What is your medication issue? Pharmacy ( [facility] [address] ) would not fill without note from the doctor( ? Prior auth or new rx not clear)   Patient wife calling to make sure its ok that he has not been able to take these since discharge and the pharmacy will not fill without talking to our clinic.

## 2019-06-24 NOTE — Telephone Encounter (Signed)
Please ensure he is on Coreg 6.25mg  BID and prescribe this medication if needed. Thank you!

## 2019-07-02 NOTE — Discharge Summary (Signed)
Physician Discharge Summary   Juan Hughes  male DOB: [birth year]  NTI:[id number]  PCP: Patient, No Pcp Per  Admit date: 06/18/2019 Discharge date: 06/23/2019  Admitted From: home Disposition:  home All prescriptions given in printed form because it's unclear where pt wanted it filled.  CODE STATUS: Full code  Discharge Instructions    Diet - low sodium heart healthy   Complete by: As directed    Increase activity slowly   Complete by: As directed        Hospital Course:  For full details, please see H&P, progress notes, consult notes and ancillary notes.  Briefly,  Juan Hughes is a 64 y.o. Hispanic M with hx HTN untreated who presented with acute left hemiparesis.  Around 2 AM the morning of admission, the patient noticed left-sided weakness in the arm and leg without slurred speech or facial droop.  Presented to the ER around 11 AM, still with left hemiparesis.  Code stroke called, CT head unremarkable, neurology were consulted.  Acute stroke Patient admitted with new left hemiparesis.  MRI brain showed Small acute lacunar infarct in the posterior right corona radiata.  tPA not given because outside window at time of presentation.  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  Echocardiogram ordered, showed no cardiac source of embolism.  Carotid imaging unremarkable.  Lipids ordered: LDL 135, continued atorvastatin 80 nightly.  PT eval initially recommended inpatient rehab, however, pt had no insurance and mobility had improved prior to discharge, so pt was discharge home.  New onset LV systolic dysfunction Patient admitted for stroke, found incidentally on echocardiogram-EF 40 to 45%.  The initial read suggested a regional wall motion abnormality, cardiology were consulted, doubt acute infarct.   Nuclear stress test was done that showed "Abnormal pharmacologic myocardial perfusion stress test without significant ischemia or  scar."  Pt was cleared for discharge from cardiology.  Carvedilol, losartan, hydralazine, and hydrochlorothiazide were all prescribed to pt's outpatient pharmacy prior to discharge.    Hypertensive urgency Initially were allowing permissive hypertension.  MRI showed small vessel disease due to hypertension.  Discussed with neurology. Losartan100,Coreg 6.25 twice daily Increase HCTZ to 50, added hydralazine.  Hypokalemia Magnesium normal Supplement potassium  Osteoarthritis Patient takes Aleve twice daily about 3 to 4 days/week for the last several years to treat osteoarthritis of the bilateral knees.  At present he has severe right knee pain and right shoulder pain when he is walking due to heavy reliance on them given his left-sided hemiparesis.  NSAID use is less than optimal in a patient with recent stroke, new onset LV dysfunction.  Recommended Tylenol.   Discharge Diagnoses:  Principal Problem:   Left-sided weakness Active Problems:   Hypertension   Hypokalemia   Tobacco abuse   Stroke (HCC)   HFrEF (heart failure with reduced ejection fraction) (HCC)   Nonischemic cardiomyopathy [facility])    Discharge Instructions:  Allergies as of 06/23/2019   No Known Allergies     Medication List    TAKE these medications   aspirin 325 MG EC tablet Take 1 tablet (325 mg total) by mouth daily.   hydrALAZINE 100 MG tablet Commonly known as: APRESOLINE Take 1 tablet (100 mg total) by mouth every 8 (eight) hours.   hydrochlorothiazide 50 MG tablet Commonly known as: HYDRODIURIL Take 1 tablet (50 mg total) by mouth daily.   losartan 100 MG tablet Commonly known as: COZAAR Take 1 tablet (100 mg total) by mouth daily.  ASK your doctor about these medications    stroke: mapping our early stages of recovery book Misc 1 each by Does not apply route once for 1 dose. Ask about: Should I take this medication?      carvedilol (COREG) 6.25 MG tablet [[id number]]  DISCONTINUED  Dose: 6.25 mg Route: Oral Frequency: 2 times daily with meals  Dispense Quantity: 60 tablet Refills: 2       Sig: Take 1 tablet (6.25 mg total) by mouth 2 (two) times daily with a meal.      Follow-up Information    Revelo, Elyse Jarvis, MD On 07/15/2019.   Specialty: Family Medicine Why: at [facility] information: [address] [place] [address] [phone]        Nelva Bush, MD. Schedule an appointment as soon as possible for a visit on 07/08/2019.   Specialty: Cardiology Why: @ 9:30 am Contact information: [address] [place] [address] [phone]           No Known Allergies   The results of significant diagnostics from this hospitalization (including imaging, microbiology, ancillary and laboratory) are listed below for reference.   Consultations:   Procedures/Studies: CT HEAD WO CONTRAST  Result Date: 06/18/2019 CLINICAL DATA:  Left leg and arm weakness.  Subsequent fall EXAM: CT HEAD WITHOUT CONTRAST TECHNIQUE: Contiguous axial images were obtained from the base of the skull through the vertex without intravenous contrast. COMPARISON:  None. FINDINGS: Brain: Chronic small vessel ischemic gliosis in the cerebral white matter, fairly extensive. No evidence of acute infarct, hemorrhage, hydrocephalus, or mass. Vascular: Vertebrobasilar tortuosity.  No hyperdense vessel. Skull: Negative Sinuses/Orbits: Negative IMPRESSION: 1. No acute finding. 2. Extensive chronic small vessel ischemia. Electronically Signed   By: Monte Fantasia M.D.   On: 06/18/2019 11:44   MR ANGIO HEAD WO CONTRAST  Result Date: 06/18/2019 CLINICAL DATA:  64 year old male with left side weakness, fall. Hypertensive. EXAM: MRI HEAD WITHOUT CONTRAST MRA HEAD WITHOUT CONTRAST TECHNIQUE: Multiplanar, multiecho pulse sequences of the brain and surrounding structures were obtained without intravenous contrast. Angiographic images of the head  were obtained using MRA technique without contrast. COMPARISON:  Head CT earlier today. FINDINGS: MRI HEAD FINDINGS Brain: Curvilinear area of restricted diffusion in the posterior right corona radiata tracking toward the posterior lentiform, in an area of about 11 millimeters (series 8, image 55). Faint T2 and FLAIR hyperintensity. No associated hemorrhage or mass effect. No other restricted diffusion. Patchy and confluent bilateral cerebral white matter T2 and FLAIR hyperintensity, also visible by CT today. Bilateral temporal lobe involvement. No cortical encephalomalacia or chronic cerebral blood products. Outside of the acute finding the deep gray nuclei are within normal limits. Brainstem and cerebellum appear within normal limits. No midline shift, mass effect, evidence of mass lesion, ventriculomegaly, extra-axial collection or acute intracranial hemorrhage. Cervicomedullary junction and pituitary are within normal limits. Vascular: Major intracranial vascular flow voids are preserved, with generalized intracranial artery tortuosity. Basilar artery ectasia. The distal left vertebral artery appears dominant. Skull and upper cervical spine: Negative visible cervical spine. Normal bone marrow signal. Sinuses/Orbits: Negative orbits. Paranasal sinuses are clear. Other: Mild left mastoid effusion. Trace retained secretions in the nasopharynx. Right mastoids are clear. MRA HEAD FINDINGS Antegrade flow in the posterior circulation with dominant and dolichoectatic distal left vertebral artery. Normal left PICA. The right V4 segment is diminutive and might be supplied in a retrograde fashion, uncertain. No visible right vertebral artery flow void in the neck on  sagittal T1 brain imaging makes this more likely. The left vertebral supplies the basilar without stenosis. Ectatic basilar artery. Widely patent SCA and PCA origins. Posterior communicating arteries are diminutive or absent. The left PCA branches are within  normal limits. The right P1 segment is tortuous but otherwise normal. There is right P2 segment signal loss which may be related to a downward trajectory of the vessel, uncertain. Distal right PCA branch enhancement seems preserved. Antegrade flow in both ICA siphons. Tortuous carotid segments. No siphon stenosis. Patent carotid termini. Normal MCA and ACA origins. Visible ACA branches are within normal limits. MCA M1 segments and bifurcations are patent without stenosis. Visible bilateral MCA branches are within normal limits. IMPRESSION: 1. Small acute lacunar infarct in the posterior right corona radiata. No associated hemorrhage or mass effect. 2. Underlying advanced bilateral cerebral white matter signal changes, nonspecific but favored due to chronic small vessel disease. 3. 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 And: - possible occlusion of the cervical right vertebral artery, supplied in a retrograde fashion from the vertebrobasilar junction. This is unrelated to #1. - questionable stenosis of the right PCA P2 segment, favor artifact instead. Electronically Signed   By: Odessa Fleming M.D.   On: 06/18/2019 18:31   MR BRAIN WO CONTRAST  Result Date: 06/18/2019 CLINICAL DATA:  64 year old male with left side weakness, fall. Hypertensive. EXAM: MRI HEAD WITHOUT CONTRAST MRA HEAD WITHOUT CONTRAST TECHNIQUE: Multiplanar, multiecho pulse sequences of the brain and surrounding structures were obtained without intravenous contrast. Angiographic images of the head were obtained using MRA technique without contrast. COMPARISON:  Head CT earlier today. FINDINGS: MRI HEAD FINDINGS Brain: Curvilinear area of restricted diffusion in the posterior right corona radiata tracking toward the posterior lentiform, in an area of about 11 millimeters (series 8, image 55). Faint T2 and FLAIR hyperintensity. No associated hemorrhage or mass effect. No other restricted diffusion. Patchy and  confluent bilateral cerebral white matter T2 and FLAIR hyperintensity, also visible by CT today. Bilateral temporal lobe involvement. No cortical encephalomalacia or chronic cerebral blood products. Outside of the acute finding the deep gray nuclei are within normal limits. Brainstem and cerebellum appear within normal limits. No midline shift, mass effect, evidence of mass lesion, ventriculomegaly, extra-axial collection or acute intracranial hemorrhage. Cervicomedullary junction and pituitary are within normal limits. Vascular: Major intracranial vascular flow voids are preserved, with generalized intracranial artery tortuosity. Basilar artery ectasia. The distal left vertebral artery appears dominant. Skull and upper cervical spine: Negative visible cervical spine. Normal bone marrow signal. Sinuses/Orbits: Negative orbits. Paranasal sinuses are clear. Other: Mild left mastoid effusion. Trace retained secretions in the nasopharynx. Right mastoids are clear. MRA HEAD FINDINGS Antegrade flow in the posterior circulation with dominant and dolichoectatic distal left vertebral artery. Normal left PICA. The right V4 segment is diminutive and might be supplied in a retrograde fashion, uncertain. No visible right vertebral artery flow void in the neck on sagittal T1 brain imaging makes this more likely. The left vertebral supplies the basilar without stenosis. Ectatic basilar artery. Widely patent SCA and PCA origins. Posterior communicating arteries are diminutive or absent. The left PCA branches are within normal limits. The right P1 segment is tortuous but otherwise normal. There is right P2 segment signal loss which may be related to a downward trajectory of the vessel, uncertain. Distal right PCA branch enhancement seems preserved. Antegrade flow in both ICA siphons. Tortuous carotid segments. No siphon stenosis. Patent carotid termini. Normal MCA and ACA origins. Visible ACA branches  are within normal limits. MCA  M1 segments and bifurcations are patent without stenosis. Visible bilateral MCA branches are within normal limits. IMPRESSION: 1. Small acute lacunar infarct in the posterior right corona radiata. No associated hemorrhage or mass effect. 2. Underlying advanced bilateral cerebral white matter signal changes, nonspecific but favored due to chronic small vessel disease. 3. 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 And: - possible occlusion of the cervical right vertebral artery, supplied in a retrograde fashion from the vertebrobasilar junction. This is unrelated to #1. - questionable stenosis of the right PCA P2 segment, favor artifact instead. Electronically Signed   By: Odessa Fleming M.D.   On: 06/18/2019 18:31   US Carotid Bilateral (at [facility] and [facility] only)  Result Date: 06/19/2019 CLINICAL DATA:  Stroke.  History of hypertension and smoking. EXAM: BILATERAL CAROTID DUPLEX ULTRASOUND TECHNIQUE: Wallace Cullens scale imaging, color Doppler and duplex ultrasound were performed of bilateral carotid and vertebral arteries in the neck. COMPARISON:  MRI/MRA - earlier same day FINDINGS: Criteria: Quantification of carotid stenosis is based on velocity parameters that correlate the residual internal carotid diameter with NASCET-based stenosis levels, using the diameter of the distal internal carotid lumen as the denominator for stenosis measurement. The following velocity measurements were obtained: RIGHT ICA: 74/50 cm/sec CCA: 85/18 cm/sec SYSTOLIC ICA/CCA RATIO:  0.9 ECA: 62 cm/sec LEFT ICA: 67/18 cm/sec CCA: 82/17 cm/sec SYSTOLIC ICA/CCA RATIO:  0.8 ECA: 82 cm/sec RIGHT CAROTID ARTERY: There is a minimal amount of eccentric echogenic plaque involving the proximal aspect of the right internal carotid artery (image 22, not resulting in elevated peak systolic velocities within the interrogated course the right internal carotid artery to suggest a hemodynamically significant stenosis. RIGHT VERTEBRAL  ARTERY:  Not visualized LEFT CAROTID ARTERY: There is a minimal amount of intimal thickening involving the distal aspect the left common carotid artery, extending to involve the left carotid bulb, origin and proximal aspects of the left internal carotid artery, not resulting in hemodynamically significant stenosis within the interrogated course the left internal carotid artery to suggest a hemodynamically significant stenosis. LEFT VERTEBRAL ARTERY:  Antegrade flow IMPRESSION: 1. Minimal amount of bilateral intimal thickening/atherosclerotic plaque, right greater than left, not resulting in a hemodynamically significant stenosis within either internal carotid artery. 2. 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 Clinical correlation is advised. 3. Antegrade flow within the left vertebral artery. Electronically Signed   By: Simonne Come M.D.   On: 06/19/2019 07:29   NM Myocar Multi W/Spect W/Wall Motion / EF  Result Date: 06/23/2019  Abnormal pharmacologic myocardial perfusion stress test without significant ischemia or scar.  The left ventricular ejection fraction is moderately decreased (35%) with global hypokinesis.  There is no significant coronary artery calcification on the attenuation correction CT.  Aortic atherosclerosis is noted, as well as multiple renal hypodensities that are incompletely characterized.  This is an intermediate risk study due to reduced LVEF.    ECHOCARDIOGRAM COMPLETE  Result Date: 06/19/2019   ECHOCARDIOGRAM REPORT   Patient Name:   Juan Hughes Date of Exam: 06/19/2019 Medical Rec #:  [id number]              Height:       60.0 in Accession #:    [id number]             Weight:  150.0 lb Date of Birth:  10/28/1955              BSA:          1.65 m Patient Age:    63  years               BP:           192/102 mmHg Patient Gender: M                      HR:           71 bpm. Exam Location:  [facility] Procedure: 2D Echo, Cardiac Doppler and Color Doppler Indications:     Stroke 434.91  History:         Patient has no prior history of Echocardiogram examinations.                  Risk Factors:Hypertension.  Sonographer:     Cristela Blue RDCS (AE) Referring Phys:  [id number] Brien Few NIU Diagnosing Phys: Adrian Blackwater MD IMPRESSIONS  1. Left ventricular ejection fraction, by visual estimation, is 40 to 45%. The left ventricle has moderately decreased function. There is severely increased left ventricular hypertrophy.  2. Left ventricular diastolic parameters are consistent with Grade I diastolic dysfunction (impaired relaxation).  3. Small to moderate myocardial infarction.  4. The left ventricle has no regional wall motion abnormalities.  5. Global right ventricle has moderately reduced systolic function.The right ventricular size is moderately enlarged. Mildly increased right ventricular wall thickness.  6. Left atrial size was moderately dilated.  7. Right atrial size was moderately dilated.  8. The mitral valve is normal in structure. Mild mitral valve regurgitation. No evidence of mitral stenosis.  9. The tricuspid valve is normal in structure. 10. The tricuspid valve is normal in structure. Tricuspid valve regurgitation is mild. 11. The aortic valve is normal in structure. Aortic valve regurgitation is not visualized. Mild aortic valve sclerosis without stenosis. 12. The pulmonic valve was normal in structure. Pulmonic valve regurgitation is not visualized. 13. Mildly elevated pulmonary artery systolic pressure. 14. The inferior vena cava is normal in size with greater than 50% respiratory variability, suggesting right atrial pressure of 3 mmHg. FINDINGS  Left Ventricle: Left ventricular ejection fraction, by visual estimation, is 40 to 45%. The left ventricle has moderately decreased function.  The left ventricle has no regional wall motion abnormalities. There is severely increased left ventricular hypertrophy. Concentric left ventricular hypertrophy. Left ventricular diastolic parameters are consistent with Grade I diastolic dysfunction (impaired relaxation). Normal left atrial pressure. There is evidence of a small to moderate myocardial infarction. Right Ventricle: The right ventricular size is moderately enlarged. Mildly increased right ventricular wall thickness. Global RV systolic function is has moderately reduced systolic function. The tricuspid regurgitant velocity is 2.44 m/s, and with an assumed right atrial pressure of 10 mmHg, the estimated right ventricular systolic pressure is mildly elevated at 33.8 mmHg. Left Atrium: Left atrial size was moderately dilated. Right Atrium: Right atrial size was moderately dilated Pericardium: There is no evidence of pericardial effusion. Mitral Valve: The mitral valve is normal in structure. Mild mitral valve regurgitation. No evidence of mitral valve stenosis by observation. Tricuspid Valve: The tricuspid valve is normal in structure. Tricuspid valve regurgitation is mild. Aortic Valve: The aortic valve is normal in structure. Aortic valve regurgitation is not visualized. Mild aortic valve sclerosis is present, with no evidence of aortic valve stenosis. Aortic valve mean gradient measures 7.0  mmHg. Aortic valve peak gradient measures 11.9 mmHg. Aortic valve area, by VTI measures 2.12 cm. Pulmonic Valve: The pulmonic valve was normal in structure. Pulmonic valve regurgitation is not visualized. Pulmonic regurgitation is not visualized. Aorta: The aortic root, ascending aorta and aortic arch are all structurally normal, with no evidence of dilitation or obstruction. Venous: The inferior vena cava is normal in size with greater than 50% respiratory variability, suggesting right atrial pressure of 3 mmHg. IAS/Shunts: No atrial level shunt detected by color  flow Doppler. There is no evidence of a patent foramen ovale. No ventricular septal defect is seen or detected. There is no evidence of an atrial septal defect.  LEFT VENTRICLE PLAX 2D LVIDd:         5.78 cm       Diastology LVIDs:         4.66 cm       LV e' lateral:   4.03 cm/s LV PW:         1.21 cm       LV E/e' lateral: 14.3 LV IVS:        1.12 cm       LV e' medial:    3.59 cm/s LVOT diam:     2.20 cm       LV E/e' medial:  16.1 LV SV:         65 ml LV SV Index:   37.70 LVOT Area:     3.80 cm  LV Volumes (MOD) LV area d, A2C:    31.20 cm LV area d, A4C:    34.30 cm LV area s, A2C:    19.60 cm LV area s, A4C:    24.40 cm LV major d, A2C:   7.58 cm LV major d, A4C:   7.57 cm LV major s, A2C:   6.28 cm LV major s, A4C:   6.82 cm LV vol d, MOD A2C: 107.0 ml LV vol d, MOD A4C: 128.0 ml LV vol s, MOD A2C: 52.2 ml LV vol s, MOD A4C: 73.1 ml LV SV MOD A2C:     54.8 ml LV SV MOD A4C:     128.0 ml LV SV MOD BP:      52.5 ml RIGHT VENTRICLE RV Basal diam:  2.92 cm RV S prime:     15.80 cm/s TAPSE (M-mode): 3.7 cm LEFT ATRIUM             Index       RIGHT ATRIUM           Index LA diam:        4.40 cm 2.66 cm/m  RA Area:     14.30 cm LA Vol (A2C):   78.8 ml 47.71 ml/m RA Volume:   27.40 ml  16.59 ml/m LA Vol (A4C):   60.4 ml 36.57 ml/m LA Biplane Vol: 75.5 ml 45.71 ml/m  AORTIC VALVE                    PULMONIC VALVE AV Area (Vmax):    1.79 cm     RVOT Peak grad: 2 mmHg AV Area (Vmean):   1.80 cm AV Area (VTI):     2.12 cm AV Vmax:           172.50 cm/s AV Vmean:          121.500 cm/s AV VTI:            0.362 m AV Peak Grad:  11.9 mmHg AV Mean Grad:      7.0 mmHg LVOT Vmax:         81.30 cm/s LVOT Vmean:        57.500 cm/s LVOT VTI:          0.202 m LVOT/AV VTI ratio: 0.56  AORTA Ao Root diam: 3.40 cm MITRAL VALVE                         TRICUSPID VALVE MV Area (PHT): 2.00 cm              TR Peak grad:   23.8 mmHg MV PHT:        110.20 msec           TR Vmax:        244.00 cm/s MV Decel Time: 380 msec  MV E velocity: 57.70 cm/s  103 cm/s  SHUNTS MV A velocity: 111.00 cm/s 70.3 cm/s Systemic VTI:  0.20 m MV E/A ratio:  0.52        1.5       Systemic Diam: 2.20 cm  Adrian Blackwater MD Electronically signed by Adrian Blackwater MD Signature Date/Time: 06/19/2019/4:20:22 PM    Final       Labs: BNP (last 3 results) No results for input(s): BNP in the last 8760 hours. Basic Metabolic Panel: No results for input(s): NA, K, CL, CO2, GLUCOSE, BUN, CREATININE, CALCIUM, MG, PHOS in the last 168 hours. Liver Function Tests: No results for input(s): AST, ALT, ALKPHOS, BILITOT, PROT, ALBUMIN in the last 168 hours. No results for input(s): LIPASE, AMYLASE in the last 168 hours. No results for input(s): AMMONIA in the last 168 hours. CBC: No results for input(s): WBC, NEUTROABS, HGB, HCT, MCV, PLT in the last 168 hours. Cardiac Enzymes: No results for input(s): CKTOTAL, CKMB, CKMBINDEX, TROPONINI in the last 168 hours. BNP: Invalid input(s): POCBNP CBG: No results for input(s): GLUCAP in the last 168 hours. D-Dimer No results for input(s): DDIMER in the last 72 hours. Hgb A1c No results for input(s): HGBA1C in the last 72 hours. Lipid Profile No results for input(s): CHOL, HDL, LDLCALC, TRIG, CHOLHDL, LDLDIRECT in the last 72 hours. Thyroid function studies No results for input(s): TSH, T4TOTAL, T3FREE, THYROIDAB in the last 72 hours.  Invalid input(s): FREET3 Anemia work up No results for input(s): VITAMINB12, FOLATE, FERRITIN, TIBC, IRON, RETICCTPCT in the last 72 hours. Urinalysis No results found for: COLORURINE, APPEARANCEUR, LABSPEC, PHURINE, GLUCOSEU, HGBUR, BILIRUBINUR, KETONESUR, PROTEINUR, UROBILINOGEN, NITRITE, LEUKOCYTESUR Sepsis Labs Invalid input(s): PROCALCITONIN,  WBC,  LACTICIDVEN Microbiology No results found for this or any previous visit (from the past 240 hour(s)).   Total time spend on discharging this patient, including the last patient exam, discussing the hospital stay,  instructions for ongoing care as it relates to all pertinent caregivers, as well as preparing the medical discharge records, prescriptions, and/or referrals as applicable, is 40 minutes.    Darlin Priestly, MD  [facility] 07/02/2019, 9:45 PM  If 7PM-7AM, please contact night-coverage

## 2019-07-05 NOTE — Progress Notes (Signed)
Office Visit    Patient Name: Juan Hughes Date of Encounter: 07/09/2019  Primary Care Provider:  Patient, No Pcp Per Primary Cardiologist: Dr. 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  Past Medical History    Past Medical History:  Diagnosis Date   Hypertension    History reviewed. No pertinent surgical history.  Allergies  No Known Allergies  History of Present Illness    64 year old male who presented to [facility] with left-sided weakness and was found to have right lacunar infarct and small vessel disease on brain MR, HFrEF, and seen today for hospital follow-up.  He was admitted to [facility] hospital 06/20/2019 after presenting to the hospital with left-sided weakness, notably of the left arm and leg.  He denied any chest pain, shortness of breath, or additional symptoms at that time.  MRA performed and showed small acute lacunar infarct in the posterior right corona radiata.  Chronic small vessel disease was noted.  Circle of Willis was remarkable for generalized intracranial artery dolichoectasia and possible occlusion of the cervical right vertebral artery.  Questionable stenosis of the right PCA P2 segment versus artifact was noted.  06/18/2019 bilateral carotid scan showed minimal amount of bilateral intimal thickening and atherosclerotic plaque with right greater than left but not resulting in hemodynamically significant stenosis within either ICA.  Also noted was nonvisualization of a patent right vertebral artery with clinical correlation recommended.  Antegrade flow was noted within the  left vertebral artery.  Echo showed EF 40 to 45% as below and thought likely due to hypertensive heart disease, given his presentation with uncontrolled hypertension.  In addition, echo showed severe LVH, G1DD, small to moderate myocardial infarction, moderately reduced RVSF, moderate RVE, mild RVH, moderate LAE/RAE, mild MR, mild TR, mild aortic sclerosis, mildly elevated PASP.  He underwent Lexiscan Myoview, during which he became hypotensive thought likely 2/2 vasovagal etiology 2/2 coughing/dry heaving from regadenoson side effects/nausea and BP improved with Aminophylline.  Please refer to 06/23/2019 progress note for additional details.  Lexiscan Myoview showed moderately reduced LVEF without significant ischemia or scar.  LVEF was estimated at 35% with global hypokinesis.  Aortic atherosclerosis was noted, as well as multiple renal hypodensities.  Overall, this was ruled in intermediate risk study due to reduced LVEF.  At discharge, BP seemed reasonably controlled with consideration that aggressive lowering of blood pressure was not recommended in the setting of acute stroke.  He was continued on Coreg, losartan, hydralazine, and HCTZ.  ASA and statin were continued as well with recommendation for follow-up labs.  He was discharged home with recommendation to follow-up within 2 weeks.  Since that time, he denies any chest pain.  He reports racing heart rate and palpitations, though improved since starting his medications.  No recent presyncope or syncope.  He is not checking his blood pressure at home but reports daily medication compliance.  He is not weighing himself at home but denies any recent fluctuations in weight.  Unfortunately, he did not take his medications this morning.  He continues to smoke with cessation advised.  He denies any signs or symptoms of bleeding.  He denies any orthopnea, lower  extremity edema, abdominal distention, shortness of breath, or dyspnea.  He reports right lower extremity  pain along the lateral side of his leg without recent injury to the leg (and with no concerning findings on physical exam).  He is adding salt to his food with recommendations as below for reduced salt intake and to keep fluid intake under 2 L.  He is curious regarding the results of his stress test as outlined below and reviewed today with his wife. Translator present.  Home Medications    Prior to Admission medications   Medication Sig Start Date End Date Taking? Authorizing Provider  aspirin EC 325 MG EC tablet Take 1 tablet (325 mg total) by mouth daily. 06/24/19   Darlin Priestly, MD  atorvastatin (LIPITOR) 80 MG tablet Take 1 tablet (80 mg total) by mouth daily at 6 PM. 06/24/19 09/22/19  Marisue Ivan D, PA-C  carvedilol (COREG) 6.25 MG tablet Take 1 tablet (6.25 mg total) by mouth 2 (two) times daily with a meal. 06/24/19 09/22/19  Marisue Ivan D, PA-C  hydrALAZINE (APRESOLINE) 100 MG tablet Take 1 tablet (100 mg total) by mouth every 8 (eight) hours. 06/23/19 09/21/19  Darlin Priestly, MD  hydrochlorothiazide (HYDRODIURIL) 50 MG tablet Take 1 tablet (50 mg total) by mouth daily. 06/24/19 09/22/19  Darlin Priestly, MD  losartan (COZAAR) 100 MG tablet Take 1 tablet (100 mg total) by mouth daily. 06/24/19 09/22/19  Darlin Priestly, MD    Review of Systems    He denies chest pain, dyspnea, pnd, orthopnea, n, v, dizziness, syncope, edema, weight gain, or early satiety.  L sided weakness ongoing. Reports racing heart rate and palpitations. Reports chronic knee pain and right lateral leg pain. All other systems reviewed and are otherwise negative except as noted above.  Physical Exam    VS:  BP (!) 142/70 (BP Location: Left Arm, Patient Position: Sitting, Cuff Size: Normal)    Pulse 62    Ht 5\' 3"  (1.6 m)    Wt 132 lb 12 oz (60.2 kg)    SpO2 99%    BMI 23.52 kg/m  , BMI Body mass index is 23.52 kg/m. GEN: Well nourished, well developed, in no acute distress.  Seated in wheelchair.  Joined by his wife. HEENT: normal. Neck:  Supple, no JVD, carotid bruits, or masses. Cardiac: RRR, no murmurs, rubs, or gallops. No clubbing, cyanosis, edema.  Radials/DP/PT 2+ and equal bilaterally.  Respiratory:  Respirations regular and unlabored, clear to auscultation bilaterally. GI: Soft, nontender, nondistended, BS + x 4. MS: no deformity or atrophy. Skin: warm and dry, no rash. Neuro:  L sided weakness otherwise strength and sensation are intact. Psych: Normal affect.  Accessory Clinical Findings    ECG personally reviewed by me today - NSR, LVH, QRS 128,  LVH and repolarization abnormalities- no acute changes.   NM Stress Test 06/2019  Abnormal pharmacologic myocardial perfusion stress test without significant ischemia or scar.  The left ventricular ejection fraction is moderately decreased (35%) with global hypokinesis.  There is no significant coronary artery calcification on the attenuation correction CT.  Aortic atherosclerosis is noted, as well as multiple renal hypodensities that are incompletely characterized.  This is an intermediate risk study due to reduced LVEF.    Echo  06/19/2019 1. Left ventricular ejection fraction, by visual estimation, is 40 to  45%. The left ventricle has moderately decreased function. There is  severely increased left ventricular hypertrophy.  2. Left ventricular diastolic parameters are consistent with Grade I  diastolic dysfunction (impaired relaxation).  3. Small to moderate myocardial infarction.  4. The left ventricle has no regional wall motion abnormalities.  5. Global right ventricle has moderately reduced systolic function.The  right ventricular size is moderately enlarged. Mildly increased right  ventricular wall thickness.  6. Left atrial size was moderately dilated.  7. Right atrial size was moderately dilated.  8. The mitral valve is normal in structure. Mild mitral valve  regurgitation. No evidence of mitral stenosis.  9. The tricuspid valve is  normal in structure.  10. The tricuspid valve is normal in structure. Tricuspid valve  regurgitation is mild.  11. The aortic valve is normal in structure. Aortic valve regurgitation is  not visualized. Mild aortic valve sclerosis without stenosis.  12. The pulmonic valve was normal in structure. Pulmonic valve  regurgitation is not visualized.  13. Mildly elevated pulmonary artery systolic pressure.  14. The inferior vena cava is normal in size with greater than 50%  respiratory variability, suggesting right atrial pressure of 3 mmHg.    US carotid bilateral IMPRESSION: 1. Minimal amount of bilateral intimal thickening/atherosclerotic plaque, right greater than left, not resulting in a hemodynamically significant stenosis within either internal carotid artery. 2. 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 Clinical correlation is advised. 3. Antegrade flow within the left vertebral artery.   Sodium 139, potassium 3.6, glucose 108, creatinine 1.09, BUN 28, calcium 9.6, magnesium 2.0, WBC 6.7, RBC 5.25, hemoglobin 14.9, hematocrit 45.6, platelets 191, TSH (2/1) 1.997  Filed Weights   07/08/19 1015  Weight: 132 lb 12 oz (60.2 kg)     Assessment & Plan    HFrEF with mild to moderately reduced ejection fraction (EF 40 to 45%, 1/31), cardiomyopathy --No SOB or DOE.  Euvolemic on exam. No chest pain.  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 Lexiscan intermediate risk but without ischemia. Continue medical management with losartan, Coreg, and HCTZ for BP control. Repeat BMET/Mg to recheck renal function and electrolytes. Future considerations to include RAS workup if BP remains elevated at RTC and once on his medications (did not take  this AM) and with consideration of multiple renal densities seen on Lexiscan. Optimize GDMT once can reassess vitals and status on his medications. Daily weights, check BP. Lifestyle changes discussed in detail, including diet /exercise, lipid control, as well as smoking cessation. Update echo in 3-6 months.  Call the office if any CP, DOE, acute SOB, volume overload, as we may need to consider further ischemic workup with cath at that time. Repeat echo in 3 months to reassess with most recent echo as above.  Racing heart rate, palpitations   --Reports improved but ongoing racing heart rate and palpitations.  No signs of arrhythmia on telemetry during hospitalization. Recheck BMET/Mg to ensure electrolytes at goal and given low K at discharge.  2 week Zio-XT monitor placed today.  Further recommendations pending results of ZIO.    Hypokalemia --Check BMET/Mg and replete if needed. Consider as contributing to racing HR/palpitations.  HTN --Unfortunately, his blood pressure is suboptimal this morning but he did not take any of his meds.  Will defer any changes to meds at this time. He has not been checking his blood pressure at home; however, his wife owns a cuff and they will start checking his blood  pressure twice daily.  Call the office if BP remains elevated once on meds. Reviewed recommendations for low-salt diet, smoking cessation, diet/exercise.    CVA -As above in HPI, he presented to [facility] with left-sided weakness and R lacunar infarct and chronic small vessel dz on MRI.   Recommend continue to control blood pressure, lipids, heart rate.  Smoking cessation.  Continue ASA.  Obtain ZIO monitor as above.  No signs or symptoms consistent with bleeding on current medications.  Current tobacco use --On nicotine patch.  Smoking cessation recommended.  HLD --Lipid panel with cholesterol 210, LDL 136, HDL 51, and TG 113. Recheck lipid and liver function in ~5-7 weeks or at time of RTC in 1 month.   Continue current statin.  LDL goal below 70.  Vascular dz: Carotid, vertebral, aortic dz --As seen on most recent hospital scans and outlined above. Continue statin and risk factor modification. HR and BP control. Smoking cessation. Continue to monitor.   Disposition: Medication changes / escalation of GDMT deferred, as patient had not yet taken his medications.  Start monitoring blood pressure at home.  Call if continued elevated BP on current medications once get home and 2 hours after taking his BP meds.  Daily weights. Complete ZIO XT for 2 weeks.  Return to clinic in 1 month.  Obtain fasting lipid and liver function at RTC.  Update echo ~ 3 to 6 months. Call office if CP or worsening sx of heart failure.   Total time spent with patient today 45 minutes. This includes reviewing records, evaluating the patient, and coordinating care. Face-to-face time >50%.   Arvil Chaco, PA-C 07/08/2019

## 2019-07-08 ENCOUNTER — Other Ambulatory Visit: Payer: Self-pay

## 2019-07-08 ENCOUNTER — Ambulatory Visit (INDEPENDENT_AMBULATORY_CARE_PROVIDER_SITE_OTHER): Payer: Self-pay

## 2019-07-08 ENCOUNTER — Ambulatory Visit (INDEPENDENT_AMBULATORY_CARE_PROVIDER_SITE_OTHER): Payer: Self-pay | Admitting: Physician Assistant

## 2019-07-08 ENCOUNTER — Encounter: Payer: Self-pay | Admitting: Physician Assistant

## 2019-07-08 VITALS — BP 142/70 | HR 62 | Ht 63.0 in | Wt 132.8 lb

## 2019-07-08 DIAGNOSIS — I10 Essential (primary) hypertension: Secondary | ICD-10-CM

## 2019-07-08 DIAGNOSIS — R002 Palpitations: Secondary | ICD-10-CM

## 2019-07-08 DIAGNOSIS — E785 Hyperlipidemia, unspecified: Secondary | ICD-10-CM

## 2019-07-08 DIAGNOSIS — E876 Hypokalemia: Secondary | ICD-10-CM

## 2019-07-08 DIAGNOSIS — R000 Tachycardia, unspecified: Secondary | ICD-10-CM

## 2019-07-08 DIAGNOSIS — F17200 Nicotine dependence, unspecified, uncomplicated: Secondary | ICD-10-CM

## 2019-07-08 DIAGNOSIS — I428 Other cardiomyopathies: Secondary | ICD-10-CM

## 2019-07-08 DIAGNOSIS — Z8673 Personal history of transient ischemic attack (TIA), and cerebral infarction without residual deficits: Secondary | ICD-10-CM

## 2019-07-08 DIAGNOSIS — R9439 Abnormal result of other cardiovascular function study: Secondary | ICD-10-CM

## 2019-07-08 DIAGNOSIS — I5020 Unspecified systolic (congestive) heart failure: Secondary | ICD-10-CM

## 2019-07-08 NOTE — Patient Instructions (Addendum)
Medication Instructions:  - Your physician recommends that you continue on your current medications as directed. Please refer to the Current Medication list given to you today.  *If you need a refill on your cardiac medications before your next appointment, please call your pharmacy*  Lab Work: - Your physician recommends that you  lab work today: BMP/ Magnesium  - Your physician recommends that you return for FASTING lab work in: 1 month- Lipid/ Liver (the day of your follow up appointment)   If you have labs (blood work) drawn today and your tests are completely normal, you will receive your results only by: Marland Kitchen MyChart Message (if you have MyChart) OR . A paper copy in the mail If you have any lab test that is abnormal or we need to change your treatment, we will call you to review the results.  Testing/Procedures: - Your physician has recommended that you wear a 14 day Zio monitor. This monitor is a medical device that records the heart's electrical activity. Doctors most often use these monitors to diagnose arrhythmias. Arrhythmias are problems with the speed or rhythm of the heartbeat. The monitor is a small device applied to your chest. You can wear one while you do your normal daily activities. While wearing this monitor if you have any symptoms to push the button and record what you felt. Once you have worn this monitor for the period of time provider prescribed (Usually 14 days), you will return the monitor device in the postage paid box. Once it is returned they will download the data collected and provide [place] with a report which the provider will then review and we will call you with those results. Important tips:  1. Avoid showering during the first 24 hours of wearing the monitor. 2. Avoid excessive sweating to help maximize wear time. 3. Do not submerge the device, no hot tubs, and no swimming pools. 4. Keep any lotions or oils away from the patch. 5. After 24 hours you may shower  with the patch on. Take brief showers with your back facing the shower head.  6. Do not remove patch once it has been placed because that will interrupt data and decrease adhesive wear time. 7. Push the button when you have any symptoms and write down what you were feeling. 8. Once you have completed wearing your monitor, remove and place into box which has postage paid and place in your outgoing mailbox.  9. If for some reason you have misplaced your box then call our office and we can provide another box and/or mail it off for you.        Follow-Up: At [facility], you and your health needs are our priority.  As part of our continuing mission to provide you with exceptional heart care, we have created designated Provider Care Teams.  These Care Teams include your primary Cardiologist (physician) and Advanced Practice Providers (APPs -  Physician Assistants and Nurse Practitioners) who all work together to provide you with the care you need, when you need it.  Your next appointment:   1 month(s)  The format for your next appointment:   In Person  Provider:    You may see [name], MD or one of the following Advanced Practice Providers on your designated Care Team:    Murray Hodgkins, NP  Christell Faith, PA-C  Marrianne Mood, PA-C   Other Instructions - stop smoking - check blood pressure twice a day - call if you have chest pain/  shortness of breath - monitor your weight

## 2019-07-09 LAB — BASIC METABOLIC PANEL
BUN/Creatinine Ratio: 20 (ref 10–24)
BUN: 24 mg/dL (ref 8–27)
CO2: 25 mmol/L (ref 20–29)
Calcium: 9.7 mg/dL (ref 8.6–10.2)
Chloride: 103 mmol/L (ref 96–106)
Creatinine, Ser: 1.2 mg/dL (ref 0.76–1.27)
GFR calc Af Amer: 75 mL/min/{1.73_m2} (ref >59)
GFR calc non Af Amer: 65 mL/min/{1.73_m2} (ref >59)
Glucose: 93 mg/dL (ref 65–99)
Potassium: 3.6 mmol/L (ref 3.5–5.2)
Sodium: 143 mmol/L (ref 134–144)

## 2019-07-09 LAB — MAGNESIUM: Magnesium: 1.8 mg/dL (ref 1.6–2.3)

## 2019-07-14 ENCOUNTER — Telehealth: Payer: Self-pay

## 2019-07-14 NOTE — Telephone Encounter (Signed)
Call attempted with spanish interpreter.  No answer, no vm.

## 2019-07-14 NOTE — Telephone Encounter (Signed)
-----   Message from Lennon Alstrom, PA-C sent at 07/12/2019  7:53 PM EST ----- Please let Mr. Juan Hughes know that his potassium is low at 3.6 with a goal of 4.0. We will want to replete this to protect against abnormal heart rhythms. Let's have him take KCL tab x2 for one day and then we will recheck his BMET and a Mg in 1 week to determine if he needs chronic supplementation.

## 2019-07-23 NOTE — Telephone Encounter (Signed)
Call attempted with spanish interpreter. No answer, no VM.

## 2019-07-28 NOTE — Telephone Encounter (Signed)
Call attempted with spanish interpreter. No answer, no vm available. 3rd attempt. Letter sent.

## 2019-07-30 ENCOUNTER — Telehealth: Payer: Self-pay

## 2019-07-30 NOTE — Telephone Encounter (Signed)
Pt was first contacted on 2/4 at 4:32pm but the VM was not set up. There have 2 more attempts between then and the start of march. A final attempt was made on 3/3 at 2:07pm but the vm box is full. Pt has [facility] contact information and application.

## 2019-08-04 ENCOUNTER — Telehealth: Payer: Self-pay

## 2019-08-04 NOTE — Telephone Encounter (Signed)
-----   Message from Lennon Alstrom, PA-C sent at 08/04/2019 12:08 PM EDT ----- [facility] news! Please let Mr. Juan Hughes know that his monitor showed his predominant rhythm as normal sinus rhythm (a normal heart rhythm). There was no evidence of an abnormal heart rhythm. He had a minimum heart rate of 53bpm and a maximum rate of 108bpm with his average heart rate at 71bpm. He had rare extra beats from the top part of his heart, some of his occurred twice in a row.   If he continues to feel his heart racing and skipping beats, he should let Dr. Azucena Cecil know at his upcoming appointment on 3/18 this week. Overall, however, these are very reassuring results.

## 2019-08-04 NOTE — Telephone Encounter (Signed)
Spoke with patient regarding results and recommendation. Used pac[place] interpreter with ID# [id number].  Patient verbalizes understanding and is agreeable to plan of care. Advised patient to call back with any issues or concerns.

## 2019-08-06 ENCOUNTER — Other Ambulatory Visit: Payer: Self-pay

## 2019-08-06 ENCOUNTER — Encounter: Payer: Self-pay | Admitting: Cardiology

## 2019-08-06 ENCOUNTER — Ambulatory Visit (INDEPENDENT_AMBULATORY_CARE_PROVIDER_SITE_OTHER): Payer: Self-pay | Admitting: Cardiology

## 2019-08-06 VITALS — BP 130/60 | HR 69 | Ht 63.0 in | Wt 141.0 lb

## 2019-08-06 DIAGNOSIS — I10 Essential (primary) hypertension: Secondary | ICD-10-CM

## 2019-08-06 DIAGNOSIS — I5020 Unspecified systolic (congestive) heart failure: Secondary | ICD-10-CM

## 2019-08-06 DIAGNOSIS — I639 Cerebral infarction, unspecified: Secondary | ICD-10-CM

## 2019-08-06 MED ORDER — CARVEDILOL 12.5 MG PO TABS: 12.5000 mg | ORAL_TABLET | Freq: Two times a day (BID) | ORAL | 2 refills | Status: DC

## 2019-08-06 MED ORDER — CARVEDILOL 12.5 MG PO TABS: 12.5000 mg | ORAL_TABLET | Freq: Two times a day (BID) | ORAL | 3 refills | Status: DC

## 2019-08-06 NOTE — Patient Instructions (Signed)
Medication Instructions:  Your physician has recommended you make the following change in your medication:  1- INCREASE Carvedilol to 12.5 mg by mouth two times a day.  *If you need a refill on your cardiac medications before your next appointment, please call your pharmacy*  Follow-Up: At [facility], you and your health needs are our priority.  As part of our continuing mission to provide you with exceptional heart care, we have created designated Provider Care Teams.  These Care Teams include your primary Cardiologist (physician) and Advanced Practice Providers (APPs -  Physician Assistants and Nurse Practitioners) who all work together to provide you with the care you need, when you need it.  We recommend signing up for the patient portal called "MyChart".  Sign up information is provided on this After Visit Summary.  MyChart is used to connect with patients for Virtual Visits (Telemedicine).  Patients are able to view lab/test results, encounter notes, upcoming appointments, etc.  Non-urgent messages can be sent to your provider as well.   To learn more about what you can do with MyChart, go to [email or web address].    Your next appointment:   2 month(s)  The format for your next appointment:   In Person  Provider:   Debbe Odea, MD

## 2019-08-06 NOTE — Progress Notes (Signed)
Cardiology Office Note:    Date:  08/06/2019   ID:  Juan Hughes, DOB 1955-06-01, MRN [id number]  PCP:  [name], PA-C  Cardiologist:  [name], MD  Electrophysiologist:  None   Referring MD: No ref. provider found   Chief Complaint  Patient presents with  . other    1 month f/u zio c/o chest discomfort during wearing monitor. Meds reviewed verbally with pt.    History of Present Illness:    Juan Hughes is a 64 y.o. male with a hx of hypertension, heart failure reduced ejection fraction, EF 40 to 45%, CVA who presents for follow-up.  Patient admitted to the hospital on 06/23/2019 with left-sided weakness.  Eventually found to have right lacunar infarct on brain MRI.  Work-up with echocardiogram showed reduced ejection fraction with EF 40 to 45%.  Pharmacologic myocardial perfusion stress test did not show any ischemia or scar.  Ejection fraction was moderately decreased.  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  Coreg, losartan, hydralazine was started.  Cardiac monitor was placed to evaluate presence of atrial fibrillation.  Patient states doing okay, still has some left arm and left leg weakness.  Past Medical History:  Diagnosis Date  . CHF (congestive heart failure) ([place])    last ef 40-45% echo 05/2019  . HFrEF (heart failure with reduced ejection fraction) ([place])   . Hypertension   . Stroke [name])     History reviewed. No pertinent surgical history.  Current Medications: Current Meds  Medication Sig  . acetaminophen (TYLENOL) 650 MG CR tablet Take 650 mg by mouth every 8 (eight) hours as needed for pain.  Marland Kitchen aspirin EC 325 MG EC tablet Take 1 tablet (325 mg total) by mouth daily.  Marland Kitchen atorvastatin (LIPITOR) 80 MG tablet Take 1 tablet (80 mg total) by mouth daily at 6 PM.  . diclofenac Sodium (VOLTAREN) 1 % GEL Apply topically as directed.  . hydrALAZINE (APRESOLINE) 100 MG tablet  Take 1 tablet (100 mg total) by mouth every 8 (eight) hours.  . hydrochlorothiazide (HYDRODIURIL) 50 MG tablet Take 1 tablet (50 mg total) by mouth daily.  Marland Kitchen losartan (COZAAR) 100 MG tablet Take 1 tablet (100 mg total) by mouth daily.  . Melatonin 1 MG CAPS Take by mouth at bedtime.  . [DISCONTINUED] carvedilol (COREG) 6.25 MG tablet Take 1 tablet (6.25 mg total) by mouth 2 (two) times daily with a meal.     Allergies:   Patient has no known allergies.   Social History   Socioeconomic History  . Marital status: Married    Spouse name: Not on file  . Number of children: Not on file  . Years of education: Not on file  . Highest education level: Not on file  Occupational History  . Not on file  Tobacco Use  . Smoking status: Current Every Day Smoker    Types: Cigars  . Smokeless tobacco: Never Used  Substance and Sexual Activity  . Alcohol use: Never  . Drug use: Never  . Sexual activity: Not on file  Other Topics Concern  . Not on file  Social History Narrative  . Not on file   Social Determinants of Health   Financial Resource Strain:   . Difficulty of Paying Living Expenses:   Food Insecurity:   . Worried About Charity fundraiser in the Last Year:   . Arboriculturist in the Last Year:   Transportation Needs:   .  Lack of Transportation (Medical):   Marland Kitchen Lack of Transportation (Non-Medical):   Physical Activity:   . Days of Exercise per Week:   . Minutes of Exercise per Session:   Stress:   . Feeling of Stress :   Social Connections:   . Frequency of Communication with Friends and Family:   . Frequency of Social Gatherings with Friends and Family:   . Attends Religious Services:   . Active Member of Clubs or Organizations:   . Attends Banker Meetings:   Marland Kitchen Marital Status:      Family History: The patient's family history includes Hypertension in his mother.  ROS:   Please see the history of present illness.     All other systems reviewed and are  negative.  EKGs/Labs/Other Studies Reviewed:    The following studies were reviewed today:   EKG:  EKG is  ordered today.  The ekg ordered today demonstrates sinus rhythm, possible LVH per voltage criteria.  Recent Labs: 06/20/2019: ALT 11 06/22/2019: TSH 1.997 06/23/2019: Hemoglobin 14.9; Platelets 191 07/08/2019: BUN 24; Creatinine, Ser 1.20; Magnesium 1.8; Potassium 3.6; Sodium 143  Recent Lipid Panel    Component Value Date/Time   CHOL 210 (H) 06/19/2019 0422   TRIG 113 06/19/2019 0422   HDL 51 06/19/2019 0422   CHOLHDL 4.1 06/19/2019 0422   VLDL 23 06/19/2019 0422   LDLCALC 136 (H) 06/19/2019 0422    Physical Exam:    VS:  BP 130/60 (BP Location: Left Arm, Patient Position: Sitting, Cuff Size: Normal)   Pulse 69   Ht 5\' 3"  (1.6 m)   Wt 141 lb (64 kg)   SpO2 98%   BMI 24.98 kg/m     Wt Readings from Last 3 Encounters:  08/06/19 141 lb (64 kg)  07/08/19 132 lb 12 oz (60.2 kg)  06/18/19 150 lb (68 kg)     GEN:  Well nourished, well developed in no acute distress HEENT: Normal NECK: No JVD; No carotid bruits LYMPHATICS: No lymphadenopathy CARDIAC: RRR, no murmurs, rubs, gallops RESPIRATORY:  Clear to auscultation without rales, wheezing or rhonchi  ABDOMEN: Soft, non-tender, non-distended MUSCULOSKELETAL:  No edema; No deformity  SKIN: Warm and dry NEUROLOGIC:  Alert and oriented x 3 PSYCHIATRIC:  Normal affect   ASSESSMENT:    1. HFrEF (heart failure with reduced ejection fraction) (HCC)   2. Essential hypertension   3. Cerebrovascular accident (CVA), unspecified mechanism (HCC)    PLAN:    In order of problems listed above:  1. Patient with history of mild to moderately reduced ejection fraction.  EF 40 to 45% etiology likely nonischemic/secondary to hypertension with Lexiscan being normal.  Increase Coreg to 12.5 mg twice daily, continue losartan.  Patient is euvolemic. 2. Blood pressure improved.  Continue blood pressure meds as prescribed. 3. History  of right lacunar infarct.  Cardiac monitor with no evidence for A. fib or flutter.  Continue aspirin, Lipitor.  Follow-up in 2 months.   Medication Adjustments/Labs and Tests Ordered: Current medicines are reviewed at length with the patient today.  Concerns regarding medicines are outlined above.  Orders Placed This Encounter  Procedures  . EKG 12-Lead   Meds ordered this encounter  Medications  . DISCONTD: carvedilol (COREG) 12.5 MG tablet    Sig: Take 1 tablet (12.5 mg total) by mouth 2 (two) times daily.    Dispense:  180 tablet    Refill:  3  . carvedilol (COREG) 12.5 MG tablet    Sig:  Take 1 tablet (12.5 mg total) by mouth 2 (two) times daily.    Dispense:  180 tablet    Refill:  2    Patient Instructions  Medication Instructions:  Your physician has recommended you make the following change in your medication:  1- INCREASE Carvedilol to 12.5 mg by mouth two times a day.  *If you need a refill on your cardiac medications before your next appointment, please call your pharmacy*  Follow-Up: At [facility], you and your health needs are our priority.  As part of our continuing mission to provide you with exceptional heart care, we have created designated Provider Care Teams.  These Care Teams include your primary Cardiologist (physician) and Advanced Practice Providers (APPs -  Physician Assistants and Nurse Practitioners) who all work together to provide you with the care you need, when you need it.  We recommend signing up for the patient portal called "MyChart".  Sign up information is provided on this After Visit Summary.  MyChart is used to connect with patients for Virtual Visits (Telemedicine).  Patients are able to view lab/test results, encounter notes, upcoming appointments, etc.  Non-urgent messages can be sent to your provider as well.   To learn more about what you can do with MyChart, go to [email or web address].    Your next appointment:   2 month(s)  The  format for your next appointment:   In Person  Provider:   Debbe Odea, MD     Signed, Debbe Odea, MD  08/06/2019 12:31 PM    [facility]

## 2019-10-05 ENCOUNTER — Other Ambulatory Visit: Payer: Self-pay

## 2019-10-05 ENCOUNTER — Ambulatory Visit (INDEPENDENT_AMBULATORY_CARE_PROVIDER_SITE_OTHER): Payer: Self-pay | Admitting: Cardiology

## 2019-10-05 ENCOUNTER — Encounter: Payer: Self-pay | Admitting: Cardiology

## 2019-10-05 DIAGNOSIS — I10 Essential (primary) hypertension: Secondary | ICD-10-CM

## 2019-10-05 DIAGNOSIS — I639 Cerebral infarction, unspecified: Secondary | ICD-10-CM

## 2019-10-05 DIAGNOSIS — I5020 Unspecified systolic (congestive) heart failure: Secondary | ICD-10-CM

## 2019-10-05 MED ORDER — HYDROCHLOROTHIAZIDE 25 MG PO TABS: 25.0000 mg | ORAL_TABLET | Freq: Every day | ORAL | 5 refills | Status: DC

## 2019-10-05 MED ORDER — CARVEDILOL 25 MG PO TABS: 25.0000 mg | ORAL_TABLET | Freq: Two times a day (BID) | ORAL | 5 refills | Status: DC

## 2019-10-05 MED ORDER — SPIRONOLACTONE 25 MG PO TABS: 25.0000 mg | ORAL_TABLET | Freq: Every day | ORAL | 5 refills | Status: DC

## 2019-10-05 NOTE — Patient Instructions (Signed)
Medication Instructions:  Your physician has recommended you make the following change in your medication:   1) DECREASE Hydrochlorothiazide to 25 mg daily  2) INCREASE Carvedilol to 25 mg twice daily  3) START Spironolactone 25 mg daily  You have been given written prescriptions to take to your pharmacy.  *If you need a refill on your cardiac medications before your next appointment, please call your pharmacy*   Lab Work: Your physician recommends that you return for lab work in: 2 weeks.  Have your lab drawn at the [facility] medical mall. You do not need an appointment. Lab hours are Mon-Fri 7am-6pm  If you have labs (blood work) drawn today and your tests are completely normal, you will receive your results only by: Marland Kitchen MyChart Message (if you have MyChart) OR . A paper copy in the mail If you have any lab test that is abnormal or we need to change your treatment, we will call you to review the results.   Testing/Procedures: None ordered   Follow-Up: At [facility], you and your health needs are our priority.  As part of our continuing mission to provide you with exceptional heart care, we have created designated Provider Care Teams.  These Care Teams include your primary Cardiologist (physician) and Advanced Practice Providers (APPs -  Physician Assistants and Nurse Practitioners) who all work together to provide you with the care you need, when you need it.  We recommend signing up for the patient portal called "MyChart".  Sign up information is provided on this After Visit Summary.  MyChart is used to connect with patients for Virtual Visits (Telemedicine).  Patients are able to view lab/test results, encounter notes, upcoming appointments, etc.  Non-urgent messages can be sent to your provider as well.   To learn more about what you can do with MyChart, go to [email or web address].    Your next appointment:   4 week(s)  The format for your next appointment:   In  Person  Provider:   Debbe Odea, MD   Other Instructions N/A

## 2019-10-05 NOTE — Progress Notes (Signed)
Cardiology Office Note:    Date:  10/05/2019   ID:  Juan Hughes, DOB [birth year], MRN [id number]  PCP:  Gorden Harms, PA-C  Cardiologist:  Debbe Odea, MD  Electrophysiologist:  None   Referring MD: Gorden Harms, PA-C   Chief Complaint  Patient presents with  . office visit    2 month F/U; Meds verbally reviewed with patient and interpreter.    History of Present Illness:   Spanish interpreter used for this visit.  Juan Hughes is a 64 y.o. male with a hx of hypertension, heart failure reduced ejection fraction, EF 40 to 45%, CVA who presents for follow-up.  Being seen for nonischemic cardiomyopathy and hypertension.  Coreg dose was titrated after last visit.  He is tolerating his current medications without any problems.  Denies chest pain or shortness of breath.  Denies edema.  Overall feels okay has no problems at this time.  Historical notes Patient admitted to the hospital on 06/23/2019 with left-sided weakness.  Eventually found to have right lacunar infarct on brain MRI.  Work-up with echocardiogram showed reduced ejection fraction with EF 40 to 45%.  Pharmacologic myocardial perfusion stress test did not show any ischemia or scar.  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  Coreg, losartan, hydralazine was started.  Cardiac monitor was placed and did not show any evidence of atrial flutter or atrial fibrillation.    Past Medical History:  Diagnosis Date  . CHF (congestive heart failure) ([facility])    last ef 40-45% echo 05/2019  . HFrEF (heart failure with reduced ejection fraction) ([facility])   . Hypertension   . Stroke [facility])     History reviewed. No pertinent surgical history.  Current Medications: Current Meds  Medication Sig  . acetaminophen (TYLENOL) 650 MG CR tablet Take 650 mg by mouth every 8 (eight) hours as needed for pain.  Marland Kitchen aspirin EC 325 MG EC tablet Take 1 tablet (325 mg  total) by mouth daily.  Marland Kitchen atorvastatin (LIPITOR) 80 MG tablet Take 1 tablet (80 mg total) by mouth daily at 6 PM.  . carvedilol (COREG) 25 MG tablet Take 1 tablet (25 mg total) by mouth 2 (two) times daily.  . diclofenac Sodium (VOLTAREN) 1 % GEL Apply topically as directed.  . hydrALAZINE (APRESOLINE) 100 MG tablet Take 1 tablet (100 mg total) by mouth every 8 (eight) hours.  . hydrochlorothiazide (HYDRODIURIL) 25 MG tablet Take 1 tablet (25 mg total) by mouth daily.  Marland Kitchen losartan (COZAAR) 100 MG tablet Take 1 tablet (100 mg total) by mouth daily.  . Melatonin 1 MG CAPS Take by mouth at bedtime.  . [DISCONTINUED] carvedilol (COREG) 12.5 MG tablet Take 1 tablet (12.5 mg total) by mouth 2 (two) times daily.  . [DISCONTINUED] hydrochlorothiazide (HYDRODIURIL) 50 MG tablet Take 1 tablet (50 mg total) by mouth daily.     Allergies:   Patient has no known allergies.   Social History   Socioeconomic History  . Marital status: Married    Spouse name: Not on file  . Number of children: Not on file  . Years of education: Not on file  . Highest education level: Not on file  Occupational History  . Not on file  Tobacco Use  . Smoking status: Former Smoker    Types: Cigars  . Smokeless tobacco: Never Used  Substance and Sexual Activity  . Alcohol use: Never  . Drug use: Never  . Sexual activity: Not on file  Other Topics Concern  . Not on file  Social History Narrative  . Not on file   Social Determinants of Health   Financial Resource Strain:   . Difficulty of Paying Living Expenses:   Food Insecurity:   . Worried About Programme researcher, broadcasting/film/video in the Last Year:   . Barista in the Last Year:   Transportation Needs:   . Freight forwarder (Medical):   Marland Kitchen Lack of Transportation (Non-Medical):   Physical Activity:   . Days of Exercise per Week:   . Minutes of Exercise per Session:   Stress:   . Feeling of Stress :   Social Connections:   . Frequency of Communication with  Friends and Family:   . Frequency of Social Gatherings with Friends and Family:   . Attends Religious Services:   . Active Member of Clubs or Organizations:   . Attends Banker Meetings:   Marland Kitchen Marital Status:      Family History: The patient's family history includes Hypertension in his mother.  ROS:   Please see the history of present illness.     All other systems reviewed and are negative.  EKGs/Labs/Other Studies Reviewed:    The following studies were reviewed today:   EKG:  EKG is  ordered today.  The ekg ordered today demonstrates sinus rhythm, LVH per voltage criteria.  Recent Labs: 06/20/2019: ALT 11 06/22/2019: TSH 1.997 06/23/2019: Hemoglobin 14.9; Platelets 191 07/08/2019: BUN 24; Creatinine, Ser 1.20; Magnesium 1.8; Potassium 3.6; Sodium 143  Recent Lipid Panel    Component Value Date/Time   CHOL 210 (H) 06/19/2019 0422   TRIG 113 06/19/2019 0422   HDL 51 06/19/2019 0422   CHOLHDL 4.1 06/19/2019 0422   VLDL 23 06/19/2019 0422   LDLCALC 136 (H) 06/19/2019 0422    Physical Exam:    VS:  BP 140/64 (BP Location: Left Arm, Patient Position: Sitting, Cuff Size: Normal)   Pulse 65   Ht 5\' 3"  (1.6 m)   Wt 149 lb 6 oz (67.8 kg)   SpO2 96%   BMI 26.46 kg/m     Wt Readings from Last 3 Encounters:  10/05/19 149 lb 6 oz (67.8 kg)  08/06/19 141 lb (64 kg)  07/08/19 132 lb 12 oz (60.2 kg)     GEN:  Well nourished, well developed in no acute distress HEENT: Normal NECK: No JVD; No carotid bruits LYMPHATICS: No lymphadenopathy CARDIAC: RRR, no murmurs, rubs, gallops RESPIRATORY:  Clear to auscultation without rales, wheezing or rhonchi  ABDOMEN: Soft, non-tender, non-distended MUSCULOSKELETAL:  No edema; No deformity  SKIN: Warm and dry NEUROLOGIC:  Alert and oriented x 3 PSYCHIATRIC:  Normal affect   ASSESSMENT:    1. HFrEF (heart failure with reduced ejection fraction) (HCC)   2. Essential hypertension   3. Cerebrovascular accident (CVA),  unspecified mechanism (HCC)    PLAN:    In order of problems listed above:  1. Patient with history of mild to moderately reduced ejection fraction.  EF 40 to 45%, etiology likely nonischemic/secondary to hypertension with Lexiscan being normal.  Currently euvolemic, increase Coreg to 25 mg twice daily, continue losartan 100 mg daily, start Aldactone 25 mg daily.  Patient is euvolemic.  Check BMP in 3 weeks. 2. History of hypertension, blood pressure improved.  Continue Coreg, losartan, hydralazine, Aldactone, as above.  Decrease HCTZ to 25 mg daily. 3. History of right lacunar infarct.  Previous cardiac monitor with no evidence for A. fib  or flutter.  Continue aspirin, Lipitor.  Follow-up in 2 months.  Total encounter time 45 minutes  Greater than 50% was spent in counseling and coordination of care with the patient Time spent for medication management, educating patient good etiologies for heart failure.  Usage of interpreter services.   This note was generated in part or whole with voice recognition software. Voice recognition is usually quite accurate but there are transcription errors that can and very often do occur. I apologize for any typographical errors that were not detected and corrected.  Medication Adjustments/Labs and Tests Ordered: Current medicines are reviewed at length with the patient today.  Concerns regarding medicines are outlined above.  Orders Placed This Encounter  Procedures  . Basic metabolic panel  . EKG 12-Lead   Meds ordered this encounter  Medications  . spironolactone (ALDACTONE) 25 MG tablet    Sig: Take 1 tablet (25 mg total) by mouth daily.    Dispense:  30 tablet    Refill:  5  . carvedilol (COREG) 25 MG tablet    Sig: Take 1 tablet (25 mg total) by mouth 2 (two) times daily.    Dispense:  60 tablet    Refill:  5    Dosage decrease  . hydrochlorothiazide (HYDRODIURIL) 25 MG tablet    Sig: Take 1 tablet (25 mg total) by mouth daily.     Dispense:  30 tablet    Refill:  5    Dosage decrease    There are no Patient Instructions on file for this visit.   Signed, [name], MD  10/05/2019 10:30 AM    [place]

## 2019-10-23 ENCOUNTER — Telehealth: Payer: Self-pay | Admitting: *Deleted

## 2019-10-23 ENCOUNTER — Other Ambulatory Visit
Admission: RE | Admit: 2019-10-23 | Discharge: 2019-10-23 | Disposition: A | Discharge status: Home / Self Care | Payer: Self-pay | Attending: Cardiology | Admitting: Cardiology

## 2019-10-23 DIAGNOSIS — I10 Essential (primary) hypertension: Secondary | ICD-10-CM | POA: Insufficient documentation

## 2019-10-23 DIAGNOSIS — I5020 Unspecified systolic (congestive) heart failure: Secondary | ICD-10-CM | POA: Insufficient documentation

## 2019-10-23 LAB — BASIC METABOLIC PANEL
Anion gap: 9 (ref 5–15)
BUN: 34 mg/dL — ABNORMAL HIGH (ref 8–23)
CO2: 22 mmol/L (ref 22–32)
Calcium: 9.5 mg/dL (ref 8.9–10.3)
Chloride: 107 mmol/L (ref 98–111)
Creatinine, Ser: 1.46 mg/dL — ABNORMAL HIGH (ref 0.61–1.24)
GFR calc Af Amer: 59 mL/min — ABNORMAL LOW (ref >60)
GFR calc non Af Amer: 51 mL/min — ABNORMAL LOW (ref >60)
Glucose, Bld: 108 mg/dL — ABNORMAL HIGH (ref 70–99)
Potassium: 4.1 mmol/L (ref 3.5–5.1)
Sodium: 138 mmol/L (ref 135–145)

## 2019-10-23 NOTE — Telephone Encounter (Signed)
-----   Message from Debbe Odea, MD sent at 10/23/2019  1:51 PM EDT ----- Creatinine is elevated, potassium normal.  Decrease Aldactone to 12.5 mg daily.  Keep follow-up appointment.

## 2019-10-23 NOTE — Telephone Encounter (Signed)
Using [employer] ID # [id number]. Attempted to reach patient and no answer and no VM.

## 2019-10-26 NOTE — Telephone Encounter (Signed)
Second attempt to contact the patient. Using pacific interpreter ID# [phone]. Unable to lmom. Patients voicemail is full

## 2019-10-28 ENCOUNTER — Encounter: Payer: Self-pay | Admitting: *Deleted

## 2019-10-28 NOTE — Telephone Encounter (Signed)
3rd attempt to contact patient. Using [employer] ID [phone]. No answer. VM is full and unalbe to leave a message.  Letter with results, recommendations, and next appointment mailed to patient.

## 2019-11-03 ENCOUNTER — Other Ambulatory Visit: Payer: Self-pay

## 2019-11-03 ENCOUNTER — Ambulatory Visit (INDEPENDENT_AMBULATORY_CARE_PROVIDER_SITE_OTHER): Payer: Self-pay | Admitting: Cardiology

## 2019-11-03 ENCOUNTER — Encounter: Payer: Self-pay | Admitting: Cardiology

## 2019-11-03 VITALS — BP 118/70 | HR 60 | Ht 64.0 in | Wt 148.4 lb

## 2019-11-03 DIAGNOSIS — I639 Cerebral infarction, unspecified: Secondary | ICD-10-CM

## 2019-11-03 DIAGNOSIS — I5020 Unspecified systolic (congestive) heart failure: Secondary | ICD-10-CM

## 2019-11-03 DIAGNOSIS — I10 Essential (primary) hypertension: Secondary | ICD-10-CM

## 2019-11-03 NOTE — Progress Notes (Signed)
Cardiology Office Note:    Date:  11/03/2019   ID:  Juan Hughes, DOB 08/24/1955, MRN [id number]  PCP:  Gorden Harms, PA-C  Cardiologist:  Debbe Odea, MD  Electrophysiologist:  None   Referring MD: Gorden Harms, PA-C   Chief Complaint  Patient presents with  . OTHER    4 wk f/u complaints today. Meds reviewed verbally with pt.    History of Present Illness:   Spanish interpreter used for this visit.  Juan Hughes is a 64 y.o. male with a hx of hypertension, heart failure reduced ejection fraction, EF 40 to 45%, CVA who presents for follow-up.  The patient is being seen for nonischemic cardiomyopathy and hypertension.  Medications are being titrated, with Coreg further titrated and spironolactone started after last visit.  Denies chest pain or shortness of breath.  Denies edema.  Overall feels okay has no problems at this time.  He is tolerating medications okay.  Would like to know results from previous lab work.  Historical notes Patient admitted to the hospital on 06/23/2019 with left-sided weakness.  Eventually found to have right lacunar infarct on brain MRI.  Work-up with echocardiogram showed reduced ejection fraction with EF 40 to 45%.  Pharmacologic myocardial perfusion stress test did not show any ischemia or scar.  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  Coreg, losartan, hydralazine was started.  Cardiac monitor was placed and did not show any evidence of atrial flutter or atrial fibrillation.    Past Medical History:  Diagnosis Date  . CHF (congestive heart failure) (HCC)    last ef 40-45% echo 05/2019  . HFrEF (heart failure with reduced ejection fraction) (HCC)   . Hypertension   . Stroke [facility])     History reviewed. No pertinent surgical history.  Current Medications: Current Meds  Medication Sig  . acetaminophen (TYLENOL) 650 MG CR tablet Take 650 mg by mouth every 8  (eight) hours as needed for pain.  Marland Kitchen aspirin EC 325 MG EC tablet Take 1 tablet (325 mg total) by mouth daily.  Marland Kitchen atorvastatin (LIPITOR) 80 MG tablet Take 1 tablet (80 mg total) by mouth daily at 6 PM.  . carvedilol (COREG) 25 MG tablet Take 1 tablet (25 mg total) by mouth 2 (two) times daily.  . diclofenac Sodium (VOLTAREN) 1 % GEL Apply topically as directed.  . hydrALAZINE (APRESOLINE) 100 MG tablet Take 1 tablet (100 mg total) by mouth every 8 (eight) hours.  . hydrochlorothiazide (HYDRODIURIL) 25 MG tablet Take 1 tablet (25 mg total) by mouth daily.  Marland Kitchen losartan (COZAAR) 100 MG tablet Take 1 tablet (100 mg total) by mouth daily.  . Melatonin 1 MG CAPS Take by mouth at bedtime.  Marland Kitchen spironolactone (ALDACTONE) 25 MG tablet Take 1 tablet (25 mg total) by mouth daily.     Allergies:   Patient has no known allergies.   Social History   Socioeconomic History  . Marital status: Married    Spouse name: Not on file  . Number of children: Not on file  . Years of education: Not on file  . Highest education level: Not on file  Occupational History  . Not on file  Tobacco Use  . Smoking status: Former Smoker    Types: Cigars  . Smokeless tobacco: Never Used  Vaping Use  . Vaping Use: Never used  Substance and Sexual Activity  . Alcohol use: Never  . Drug use: Never  . Sexual activity:  Not on file  Other Topics Concern  . Not on file  Social History Narrative  . Not on file   Social Determinants of Health   Financial Resource Strain:   . Difficulty of Paying Living Expenses:   Food Insecurity:   . Worried About Charity fundraiser in the Last Year:   . Arboriculturist in the Last Year:   Transportation Needs:   . Film/video editor (Medical):   Marland Kitchen Lack of Transportation (Non-Medical):   Physical Activity:   . Days of Exercise per Week:   . Minutes of Exercise per Session:   Stress:   . Feeling of Stress :   Social Connections:   . Frequency of Communication with Friends  and Family:   . Frequency of Social Gatherings with Friends and Family:   . Attends Religious Services:   . Active Member of Clubs or Organizations:   . Attends Archivist Meetings:   Marland Kitchen Marital Status:      Family History: The patient's family history includes Hypertension in his mother.  ROS:   Please see the history of present illness.     All other systems reviewed and are negative.  EKGs/Labs/Other Studies Reviewed:    The following studies were reviewed today:   EKG:  EKG is  ordered today.  The ekg ordered today demonstrates sinus rhythm, possible LVH per voltage criteria.  Recent Labs: 06/20/2019: ALT 11 06/22/2019: TSH 1.997 06/23/2019: Hemoglobin 14.9; Platelets 191 07/08/2019: Magnesium 1.8 10/23/2019: BUN 34; Creatinine, Ser 1.46; Potassium 4.1; Sodium 138  Recent Lipid Panel    Component Value Date/Time   CHOL 210 (H) 06/19/2019 0422   TRIG 113 06/19/2019 0422   HDL 51 06/19/2019 0422   CHOLHDL 4.1 06/19/2019 0422   VLDL 23 06/19/2019 0422   LDLCALC 136 (H) 06/19/2019 0422    Physical Exam:    VS:  BP 118/70 (BP Location: Left Arm, Patient Position: Sitting, Cuff Size: Normal)   Pulse 60   Ht 5\' 4"  (1.626 m)   Wt 148 lb 6 oz (67.3 kg)   SpO2 98%   BMI 25.47 kg/m     Wt Readings from Last 3 Encounters:  11/03/19 148 lb 6 oz (67.3 kg)  10/05/19 149 lb 6 oz (67.8 kg)  08/06/19 141 lb (64 kg)     GEN:  Well nourished, well developed in no acute distress HEENT: Normal NECK: No JVD; No carotid bruits LYMPHATICS: No lymphadenopathy CARDIAC: RRR, no murmurs, rubs, gallops RESPIRATORY:  Clear to auscultation without rales, wheezing or rhonchi  ABDOMEN: Soft, non-tender, non-distended MUSCULOSKELETAL:  No edema; No deformity  SKIN: Warm and dry NEUROLOGIC:  Alert and oriented x 3 PSYCHIATRIC:  Normal affect   ASSESSMENT:    1. HFrEF (heart failure with reduced ejection fraction) ([place])   2. Essential hypertension   3. Cerebrovascular accident  (CVA), unspecified mechanism ([place])    PLAN:    In order of problems listed above:  1. Patient with history of mild to moderately reduced ejection fraction.  EF 40 to 45%, etiology likely nonischemic/secondary to hypertension with Lexiscan being normal.  NYHA class II. Continue increase Coreg to 25 mg twice daily, continue losartan 100 mg daily, start Aldactone 25 mg daily.  Patient is euvolemic.  We will continue current optimal medical therapy for about 3 months, repeat echocardiogram prior to follow-up visit in 3 months to reevaluate EF.  BMP did show a creatinine of 1.46.  Will repeat BMP  today.  If creatinine is still abnormal, plan to further decrease HCTZ or stopping it completely. 2. History of hypertension, blood pressure controlled.  Continue Coreg, losartan, hydralazine, Aldactone, as above.  Decrease HCTZ to 25 mg daily. 3. History of CVA, right lacunar infarct. Continue aspirin, Lipitor.  Last LDL elevated, we will repeat fasting lipid panel prior to follow-up visit.  Follow-up in 3 months after echo.  Total encounter time 43 minutes  Greater than 50% was spent in counseling and coordination of care with the patient Time spent including but not limited to medication management, Usage of interpreter services, medication management.   This note was generated in part or whole with voice recognition software. Voice recognition is usually quite accurate but there are transcription errors that can and very often do occur. I apologize for any typographical errors that were not detected and corrected.  Medication Adjustments/Labs and Tests Ordered: Current medicines are reviewed at length with the patient today.  Concerns regarding medicines are outlined above.  Orders Placed This Encounter  Procedures  . Lipid Profile  . Basic Metabolic Panel (BMET)  . EKG 12-Lead  . ECHOCARDIOGRAM COMPLETE   No orders of the defined types were placed in this encounter.   Patient Instructions    Medication Instructions:  Your physician recommends that you continue on your current medications as directed. Please refer to the Current Medication list given to you today.  *If you need a refill on your cardiac medications before your next appointment, please call your pharmacy*   Lab Work: BMP today  Fasting Lipid Panel to be drawn on day of Echo.  - You will need to be fasting. Please do not have anything to eat or drink after midnight the morning you have the lab work. You may only have water or black coffee with no cream or sugar.   If you have labs (blood work) drawn today and your tests are completely normal, you will receive your results only by: Marland Kitchen MyChart Message (if you have MyChart) OR . A paper copy in the mail If you have any lab test that is abnormal or we need to change your treatment, we will call you to review the results.   Testing/Procedures:  Your physician has requested that you have an echocardiogram. Echocardiography is a painless test that uses sound waves to create images of your heart. It provides your doctor with information about the size and shape of your heart and how well your heart's chambers and valves are working. This procedure takes approximately one hour. There are no restrictions for this procedure.    Follow-Up: At [facility], you and your health needs are our priority.  As part of our continuing mission to provide you with exceptional heart care, we have created designated Provider Care Teams.  These Care Teams include your primary Cardiologist (physician) and Advanced Practice Providers (APPs -  Physician Assistants and Nurse Practitioners) who all work together to provide you with the care you need, when you need it.  We recommend signing up for the patient portal called "MyChart".  Sign up information is provided on this After Visit Summary.  MyChart is used to connect with patients for Virtual Visits (Telemedicine).  Patients are able to  view lab/test results, encounter notes, upcoming appointments, etc.  Non-urgent messages can be sent to your provider as well.   To learn more about what you can do with MyChart, go to [email or web address].    Your next appointment:   Follow  up after echo. (echo to be scheduled in 3 months 02/03/20)   The format for your next appointment:   In Person  Provider:   Debbe Odea, MD   Other Instructions   Ecocardiograma Echocardiogram Un ecocardiograma es un procedimiento que [place] ondas sonoras indoloras (ultrasonido) para obtener una imagen del corazn. Las imgenes de un ecocardiograma pueden proporcionar informacin importante sobre lo siguiente:  Signos de arteriopata coronaria (EAC).  Signos de aneurisma. Un aneurisma es una zona debilitada o daada de la pared de una arteria que se abulta por la fuerza normal del bombeo de la sangre a travs del cuerpo.  Tamao y forma del corazn. Los [employer] tamao y la forma del corazn se pueden asociar a determinadas afecciones, como insuficiencia cardaca, aneurisma y Hughes.  Funcin del msculo cardaco.  Funcin de la vlvula cardaca.  Signos de un infarto de miocardio previo.  Acumulacin de lquido alrededor del corazn.  Engrosamiento del msculo cardaco.  Un tumor o crecimiento infeccioso alrededor de las vlvulas cardacas. Informe al mdico acerca de lo siguiente:  Cualquier alergia que tenga.  Todos los [employer] consume, incluidos vitaminas, hierbas, gotas oftlmicas, cremas y [address] de [address].  Cualquier enfermedad de la sangre que tenga.  Cirugas a las que se someti.  Cualquier enfermedad que tenga.  Si est embarazada o podra estarlo. Cules son los riesgos? En general, se trata de un procedimiento seguro. Sin embargo, pueden ocurrir complicaciones, por ejemplo:  Reaccin alrgica al tinte de contraste utilizado en el procedimiento. Qu ocurre antes del procedimiento? No  se requiere Lobbyist. Podr comer y beber normalmente. Qu ocurre durante el procedimiento?   Pueden colocarle un tubo (catter) intravenoso en una de las venas.  Puede recibir Arts development officer a travs de esta va. Un contraste es una inyeccin que mejora la calidad de las imgenes del corazn.  Le aplicarn un gel sobre el pecho.  Le pasarn un instrumento similar a una vara (transductor) sobre el pecho. El gel ayudar a transmitir las [employer] del transductor.  Las ondas sonoras rebotarn inofensivamente en el corazn para permitir capturar las imgenes del corazn en movimiento, en tiempo real. Las imgenes se registrarn en una computadora. Este procedimiento puede variar segn el mdico y el hospital. Ladell Heads sucede despus del procedimiento?  Puede retomar su rutina diaria normal, incluidos la dieta, las actividades y [employer], a menos que su mdico le indique lo contrario. Resumen  Un ecocardiograma es un procedimiento que [place] ondas sonoras indoloras (ultrasonido) para obtener una imagen del corazn.  Las imgenes de un ecocardiograma pueden brindar informacin importante sobre el tamao y la forma del corazn, la funcin del msculo cardaco, la funcin de la vlvula cardaca y la acumulacin de lquido alrededor del Programmer, multimedia.  No es necesario prepararse para este procedimiento. Podr comer y beber normalmente.  Al finalizar el ecocardiograma, puede retomar su rutina diaria normal, a menos que su mdico le indique lo contrario. Esta informacin no tiene Theme park manager el consejo del mdico. Asegrese de hacerle al mdico cualquier pregunta que tenga. Document Revised: 02/14/2017 Document Reviewed: 08/31/2016 Elsevier Patient Education  2020 [employer].       Signed, Debbe Odea, MD  11/03/2019 9:53 AM    [facility]

## 2019-11-03 NOTE — Progress Notes (Signed)
Used interpreter Dorene Grebe [phone]) for discharge instructions, and checkout for scheduling echo and fasting lipid in 3 months, along with follow up appointment thereafter.

## 2019-11-03 NOTE — Patient Instructions (Addendum)
Medication Instructions:  Your physician recommends that you continue on your current medications as directed. Please refer to the Current Medication list given to you today.  *If you need a refill on your cardiac medications before your next appointment, please call your pharmacy*   Lab Work: BMP today  Fasting Lipid Panel to be drawn on day of Echo.  - You will need to be fasting. Please do not have anything to eat or drink after midnight the morning you have the lab work. You may only have water or black coffee with no cream or sugar.   If you have labs (blood work) drawn today and your tests are completely normal, you will receive your results only by: Marland Kitchen MyChart Message (if you have MyChart) OR . A paper copy in the mail If you have any lab test that is abnormal or we need to change your treatment, we will call you to review the results.   Testing/Procedures:  Your physician has requested that you have an echocardiogram. Echocardiography is a painless test that uses sound waves to create images of your heart. It provides your doctor with information about the size and shape of your heart and how well your heart's chambers and valves are working. This procedure takes approximately one hour. There are no restrictions for this procedure.    Follow-Up: At [facility], you and your health needs are our priority.  As part of our continuing mission to provide you with exceptional heart care, we have created designated Provider Care Teams.  These Care Teams include your primary Cardiologist (physician) and Advanced Practice Providers (APPs -  Physician Assistants and Nurse Practitioners) who all work together to provide you with the care you need, when you need it.  We recommend signing up for the patient portal called "MyChart".  Sign up information is provided on this After Visit Summary.  MyChart is used to connect with patients for Virtual Visits (Telemedicine).  Patients are able to  view lab/test results, encounter notes, upcoming appointments, etc.  Non-urgent messages can be sent to your provider as well.   To learn more about what you can do with MyChart, go to [email or web address].    Your next appointment:   Follow up after echo. (echo to be scheduled in 3 months 02/03/20)   The format for your next appointment:   In Person  Provider:   [name], MD   Other Instructions   Ecocardiograma Echocardiogram Un ecocardiograma es un procedimiento que [place] ondas sonoras indoloras (ultrasonido) para obtener una imagen del corazn. Las imgenes de un ecocardiograma pueden proporcionar informacin importante sobre lo siguiente:  Signos de arteriopata coronaria (EAC).  Signos de aneurisma. Un aneurisma es una zona debilitada o daada de la pared de una arteria que se abulta por la fuerza normal del bombeo de la sangre a travs del cuerpo.  Tamao y forma del corazn. Los [employer] tamao y la forma del corazn se pueden asociar a determinadas afecciones, como insuficiencia cardaca, aneurisma y [place].  Funcin del msculo cardaco.  Funcin de la vlvula cardaca.  Signos de un infarto de miocardio previo.  Acumulacin de lquido alrededor del corazn.  Engrosamiento del msculo cardaco.  Un tumor o crecimiento infeccioso alrededor de las vlvulas cardacas. Informe al mdico acerca de lo siguiente:  Cualquier alergia que tenga.  Todos los [employer] consume, incluidos vitaminas, hierbas, gotas oftlmicas, cremas y medicamentos de venta libre.  Cualquier enfermedad de la sangre que tenga.  Cirugas a  las que se someti.  Cualquier enfermedad que tenga.  Si est embarazada o podra estarlo. Cules son los riesgos? En general, se trata de un procedimiento seguro. Sin embargo, pueden ocurrir complicaciones, por ejemplo:  Reaccin alrgica al tinte de contraste utilizado en el procedimiento. Qu ocurre antes del procedimiento? No  se requiere Lobbyist. Podr comer y beber normalmente. Qu ocurre durante el procedimiento?   Pueden colocarle un tubo (catter) intravenoso en una de las venas.  Puede recibir Arts development officer a travs de esta va. Un contraste es una inyeccin que mejora la calidad de las imgenes del corazn.  Le aplicarn un gel sobre el pecho.  Le pasarn un instrumento similar a una vara (transductor) sobre el pecho. El gel ayudar a transmitir las [employer] del transductor.  Las ondas sonoras rebotarn inofensivamente en el corazn para permitir capturar las imgenes del corazn en movimiento, en tiempo real. Las imgenes se registrarn en una computadora. Este procedimiento puede variar segn el mdico y el hospital. Ladell Heads sucede despus del procedimiento?  Puede retomar su rutina diaria normal, incluidos la dieta, las actividades y [employer], a menos que su mdico le indique lo contrario. Resumen  Un ecocardiograma es un procedimiento que [place] ondas sonoras indoloras (ultrasonido) para obtener una imagen del corazn.  Las imgenes de un ecocardiograma pueden brindar informacin importante sobre el tamao y la forma del corazn, la funcin del msculo cardaco, la funcin de la vlvula cardaca y la acumulacin de lquido alrededor del Programmer, multimedia.  No es necesario prepararse para este procedimiento. Podr comer y beber normalmente.  Al finalizar el ecocardiograma, puede retomar su rutina diaria normal, a menos que su mdico le indique lo contrario. Esta informacin no tiene Theme park manager el consejo del mdico. Asegrese de hacerle al mdico cualquier pregunta que tenga. Document Revised: 02/14/2017 Document Reviewed: 08/31/2016 Elsevier Patient Education  2020 [employer].

## 2019-11-04 LAB — BASIC METABOLIC PANEL
BUN/Creatinine Ratio: 19 (ref 10–24)
BUN: 28 mg/dL — ABNORMAL HIGH (ref 8–27)
CO2: 19 mmol/L — ABNORMAL LOW (ref 20–29)
Calcium: 9.8 mg/dL (ref 8.6–10.2)
Chloride: 105 mmol/L (ref 96–106)
Creatinine, Ser: 1.44 mg/dL — ABNORMAL HIGH (ref 0.76–1.27)
GFR calc Af Amer: 60 mL/min/{1.73_m2} (ref >59)
GFR calc non Af Amer: 52 mL/min/{1.73_m2} — ABNORMAL LOW (ref >59)
Glucose: 95 mg/dL (ref 65–99)
Potassium: 4.4 mmol/L (ref 3.5–5.2)
Sodium: 139 mmol/L (ref 134–144)

## 2019-11-06 ENCOUNTER — Telehealth: Payer: Self-pay | Admitting: *Deleted

## 2019-11-06 DIAGNOSIS — I5020 Unspecified systolic (congestive) heart failure: Secondary | ICD-10-CM

## 2019-11-06 NOTE — Telephone Encounter (Signed)
-----   Message from Debbe Odea, MD sent at 11/04/2019  3:47 PM EDT ----- Patient's creatinine on last BMP is elevated at 1.4.  Decrease HCTZ from 25 mg to 12.5 mg daily.  Also decrease spironolactone from 25 mg to 12.5 mg daily.    Plan for repeat BMP in 6 weeks after above changes.  We will call patient if any further changes are needed.  Thank you

## 2019-11-06 NOTE — Telephone Encounter (Addendum)
Attempted to reach patient using [employer] ID [phone].  NO answer and VM is full.

## 2019-11-09 MED ORDER — SPIRONOLACTONE 25 MG PO TABS: 12.5000 mg | ORAL_TABLET | Freq: Every day | ORAL | 5 refills | Status: DC

## 2019-11-09 MED ORDER — HYDROCHLOROTHIAZIDE 25 MG PO TABS: 12.5000 mg | ORAL_TABLET | Freq: Every day | ORAL | 5 refills | Status: DC

## 2019-11-09 NOTE — Telephone Encounter (Signed)
Spoke with patient using [employer] [phone].  Discussed orders copied below from Dr. Azucena Cecil:  Patient's creatinine on last BMP is elevated at 1.4.  Decrease HCTZ from 25 mg to 12.5 mg daily.  Also decrease spironolactone from 25 mg to 12.5 mg daily.    Plan for repeat BMP in 6 weeks after above changes.  We will call patient if any further changes are needed.  Patient verbalized understanding and agreed with plan.

## 2019-11-17 ENCOUNTER — Other Ambulatory Visit: Payer: Self-pay | Admitting: Physician Assistant

## 2019-11-17 DIAGNOSIS — M25562 Pain in left knee: Secondary | ICD-10-CM

## 2019-11-17 DIAGNOSIS — G8929 Other chronic pain: Secondary | ICD-10-CM

## 2019-11-17 DIAGNOSIS — M21961 Unspecified acquired deformity of right lower leg: Secondary | ICD-10-CM

## 2019-12-01 ENCOUNTER — Other Ambulatory Visit: Payer: Self-pay

## 2019-12-01 DIAGNOSIS — Z79899 Other long term (current) drug therapy: Secondary | ICD-10-CM

## 2019-12-01 DIAGNOSIS — I5020 Unspecified systolic (congestive) heart failure: Secondary | ICD-10-CM

## 2019-12-01 NOTE — Progress Notes (Signed)
For 6 week BMP after 6/21 decrease of HCTZ, and Spironolactone. See telephone encounter from 11/09/19.

## 2019-12-23 ENCOUNTER — Ambulatory Visit: Payer: No Typology Code available for payment source | Attending: Physician Assistant | Admitting: Physical Therapy

## 2019-12-23 ENCOUNTER — Ambulatory Visit
Admission: RE | Admit: 2019-12-23 | Discharge: 2019-12-23 | Disposition: A | Discharge status: Home / Self Care | Payer: No Typology Code available for payment source | Attending: Physician Assistant | Admitting: Physician Assistant

## 2019-12-23 ENCOUNTER — Encounter: Payer: Self-pay | Admitting: Physical Therapy

## 2019-12-23 ENCOUNTER — Other Ambulatory Visit: Payer: Self-pay

## 2019-12-23 ENCOUNTER — Ambulatory Visit
Admission: RE | Admit: 2019-12-23 | Discharge: 2019-12-23 | Disposition: A | Discharge status: Home / Self Care | Payer: No Typology Code available for payment source | Source: Ambulatory Visit | Attending: Physician Assistant | Admitting: Physician Assistant

## 2019-12-23 ENCOUNTER — Other Ambulatory Visit
Admission: RE | Admit: 2019-12-23 | Discharge: 2019-12-23 | Disposition: A | Discharge status: Home / Self Care | Payer: No Typology Code available for payment source | Source: Ambulatory Visit | Attending: Cardiology | Admitting: Cardiology

## 2019-12-23 ENCOUNTER — Other Ambulatory Visit: Payer: Self-pay | Admitting: Physician Assistant

## 2019-12-23 DIAGNOSIS — M25561 Pain in right knee: Secondary | ICD-10-CM | POA: Insufficient documentation

## 2019-12-23 DIAGNOSIS — R2689 Other abnormalities of gait and mobility: Secondary | ICD-10-CM | POA: Insufficient documentation

## 2019-12-23 DIAGNOSIS — Z79899 Other long term (current) drug therapy: Secondary | ICD-10-CM | POA: Insufficient documentation

## 2019-12-23 DIAGNOSIS — M25562 Pain in left knee: Secondary | ICD-10-CM | POA: Insufficient documentation

## 2019-12-23 DIAGNOSIS — R262 Difficulty in walking, not elsewhere classified: Secondary | ICD-10-CM

## 2019-12-23 DIAGNOSIS — G8929 Other chronic pain: Secondary | ICD-10-CM | POA: Insufficient documentation

## 2019-12-23 DIAGNOSIS — I5020 Unspecified systolic (congestive) heart failure: Secondary | ICD-10-CM | POA: Insufficient documentation

## 2019-12-23 LAB — BASIC METABOLIC PANEL
Anion gap: 10 (ref 5–15)
BUN: 30 mg/dL — ABNORMAL HIGH (ref 8–23)
CO2: 22 mmol/L (ref 22–32)
Calcium: 9.7 mg/dL (ref 8.9–10.3)
Chloride: 107 mmol/L (ref 98–111)
Creatinine, Ser: 1.55 mg/dL — ABNORMAL HIGH (ref 0.61–1.24)
GFR calc Af Amer: 55 mL/min — ABNORMAL LOW (ref >60)
GFR calc non Af Amer: 47 mL/min — ABNORMAL LOW (ref >60)
Glucose, Bld: 104 mg/dL — ABNORMAL HIGH (ref 70–99)
Potassium: 4.5 mmol/L (ref 3.5–5.1)
Sodium: 139 mmol/L (ref 135–145)
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
## 2019-12-24 ENCOUNTER — Telehealth: Payer: Self-pay

## 2019-12-24 ENCOUNTER — Other Ambulatory Visit: Payer: Self-pay

## 2019-12-24 DIAGNOSIS — Z79899 Other long term (current) drug therapy: Secondary | ICD-10-CM

## 2019-12-24 NOTE — Progress Notes (Unsigned)
Lab ordered for 2 week check per BMP result note.

## 2019-12-25 NOTE — Telephone Encounter (Signed)
Attempted to call patient X2 using interpreter [phone].

## 2019-12-29 ENCOUNTER — Ambulatory Visit: Payer: Self-pay | Admitting: Physical Therapy

## 2019-12-29 ENCOUNTER — Other Ambulatory Visit: Payer: Self-pay

## 2019-12-29 DIAGNOSIS — R2689 Other abnormalities of gait and mobility: Secondary | ICD-10-CM

## 2019-12-29 DIAGNOSIS — G8929 Other chronic pain: Secondary | ICD-10-CM

## 2019-12-29 DIAGNOSIS — R262 Difficulty in walking, not elsewhere classified: Secondary | ICD-10-CM

## 2019-12-29 DIAGNOSIS — M25562 Pain in left knee: Secondary | ICD-10-CM
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
## 2019-12-29 NOTE — Telephone Encounter (Signed)
Called patients wife using interpreter [phone], gave Debarah Crape (wife) instructions per Dr. Myriam Forehand as follows: "Creatinine abnormal, stop HCTZ for now. Repeat BMP in 2 weeks."  Patients VM had been full and I have been trying to reach him without being able to leave messages. Debarah Crape stated that the patient will not answer his phone if he doesn't know who is calling and he does not clear out his messages. I requested that when they come for the lab draw in 2 weeks to please stop by our office to give permission on file for [place] to call her and disclose information in the future as there is no DPR on file.  [name] verbalized understanding and agreed with plan.

## 2020-01-01 ENCOUNTER — Ambulatory Visit: Payer: Self-pay | Admitting: Physical Therapy

## 2020-01-01 ENCOUNTER — Other Ambulatory Visit: Payer: Self-pay

## 2020-01-01 ENCOUNTER — Encounter: Payer: Self-pay | Admitting: Physical Therapy

## 2020-01-01 DIAGNOSIS — G8929 Other chronic pain: Secondary | ICD-10-CM

## 2020-01-01 DIAGNOSIS — R2689 Other abnormalities of gait and mobility: Secondary | ICD-10-CM

## 2020-01-01 DIAGNOSIS — R262 Difficulty in walking, not elsewhere classified: Secondary | ICD-10-CM
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
## 2020-01-05 ENCOUNTER — Encounter: Payer: Self-pay | Admitting: Physical Therapy

## 2020-01-06 ENCOUNTER — Other Ambulatory Visit: Payer: Self-pay

## 2020-01-06 ENCOUNTER — Ambulatory Visit: Payer: No Typology Code available for payment source | Admitting: Physical Therapy

## 2020-01-06 ENCOUNTER — Encounter: Payer: Self-pay | Admitting: Physical Therapy

## 2020-01-06 DIAGNOSIS — M25562 Pain in left knee: Secondary | ICD-10-CM

## 2020-01-06 DIAGNOSIS — G8929 Other chronic pain: Secondary | ICD-10-CM

## 2020-01-06 DIAGNOSIS — R262 Difficulty in walking, not elsewhere classified: Secondary | ICD-10-CM

## 2020-01-06 DIAGNOSIS — R2689 Other abnormalities of gait and mobility: Secondary | ICD-10-CM
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
## 2020-01-08 ENCOUNTER — Other Ambulatory Visit: Payer: Self-pay

## 2020-01-08 ENCOUNTER — Encounter: Payer: Self-pay | Admitting: Physical Therapy

## 2020-01-08 ENCOUNTER — Ambulatory Visit: Payer: No Typology Code available for payment source | Admitting: Physical Therapy

## 2020-01-08 DIAGNOSIS — R262 Difficulty in walking, not elsewhere classified: Secondary | ICD-10-CM

## 2020-01-08 DIAGNOSIS — R2689 Other abnormalities of gait and mobility: Secondary | ICD-10-CM

## 2020-01-08 DIAGNOSIS — G8929 Other chronic pain: Secondary | ICD-10-CM
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
## 2020-01-12 ENCOUNTER — Encounter: Payer: Self-pay | Admitting: Physical Therapy

## 2020-01-12 ENCOUNTER — Other Ambulatory Visit
Admission: RE | Admit: 2020-01-12 | Discharge: 2020-01-12 | Disposition: A | Discharge status: Home / Self Care | Payer: No Typology Code available for payment source | Attending: Cardiology | Admitting: Cardiology

## 2020-01-12 ENCOUNTER — Ambulatory Visit: Payer: No Typology Code available for payment source | Admitting: Physical Therapy

## 2020-01-12 ENCOUNTER — Other Ambulatory Visit: Payer: Self-pay

## 2020-01-12 DIAGNOSIS — G8929 Other chronic pain: Secondary | ICD-10-CM

## 2020-01-12 DIAGNOSIS — Z79899 Other long term (current) drug therapy: Secondary | ICD-10-CM

## 2020-01-12 DIAGNOSIS — M25562 Pain in left knee: Secondary | ICD-10-CM

## 2020-01-12 DIAGNOSIS — R2689 Other abnormalities of gait and mobility: Secondary | ICD-10-CM

## 2020-01-12 DIAGNOSIS — R262 Difficulty in walking, not elsewhere classified: Secondary | ICD-10-CM

## 2020-01-12 LAB — BASIC METABOLIC PANEL
Anion gap: 11 (ref 5–15)
BUN: 24 mg/dL — ABNORMAL HIGH (ref 8–23)
CO2: 20 mmol/L — ABNORMAL LOW (ref 22–32)
Calcium: 9.2 mg/dL (ref 8.9–10.3)
Chloride: 109 mmol/L (ref 98–111)
Creatinine, Ser: 1.2 mg/dL (ref 0.61–1.24)
GFR calc Af Amer: >60 mL/min (ref >60)
GFR calc non Af Amer: >60 mL/min (ref >60)
Glucose, Bld: 138 mg/dL — ABNORMAL HIGH (ref 70–99)
Potassium: 3.5 mmol/L (ref 3.5–5.1)
Sodium: 140 mmol/L (ref 135–145)
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
## 2020-01-14 ENCOUNTER — Encounter: Payer: Self-pay | Admitting: Physical Therapy

## 2020-01-14 ENCOUNTER — Other Ambulatory Visit: Payer: Self-pay

## 2020-01-14 ENCOUNTER — Ambulatory Visit: Payer: Self-pay | Admitting: Physical Therapy

## 2020-01-14 DIAGNOSIS — M25561 Pain in right knee: Secondary | ICD-10-CM

## 2020-01-14 DIAGNOSIS — R2689 Other abnormalities of gait and mobility: Secondary | ICD-10-CM

## 2020-01-14 DIAGNOSIS — M25562 Pain in left knee: Secondary | ICD-10-CM

## 2020-01-14 DIAGNOSIS — R262 Difficulty in walking, not elsewhere classified: Secondary | ICD-10-CM
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
## 2020-01-19 ENCOUNTER — Ambulatory Visit: Payer: Self-pay | Admitting: Physical Therapy

## 2020-01-19 ENCOUNTER — Other Ambulatory Visit: Payer: Self-pay

## 2020-01-19 ENCOUNTER — Encounter: Payer: Self-pay | Admitting: Physical Therapy

## 2020-01-19 DIAGNOSIS — R262 Difficulty in walking, not elsewhere classified: Secondary | ICD-10-CM

## 2020-01-19 DIAGNOSIS — G8929 Other chronic pain: Secondary | ICD-10-CM

## 2020-01-19 DIAGNOSIS — R2689 Other abnormalities of gait and mobility: Secondary | ICD-10-CM
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
## 2020-01-21 ENCOUNTER — Encounter: Payer: Self-pay | Admitting: Physical Therapy

## 2020-01-21 ENCOUNTER — Other Ambulatory Visit: Payer: Self-pay

## 2020-01-21 ENCOUNTER — Ambulatory Visit: Payer: No Typology Code available for payment source | Attending: Physician Assistant | Admitting: Physical Therapy

## 2020-01-21 DIAGNOSIS — M25561 Pain in right knee: Secondary | ICD-10-CM | POA: Insufficient documentation

## 2020-01-21 DIAGNOSIS — R262 Difficulty in walking, not elsewhere classified: Secondary | ICD-10-CM

## 2020-01-21 DIAGNOSIS — M25562 Pain in left knee: Secondary | ICD-10-CM

## 2020-01-21 DIAGNOSIS — R2689 Other abnormalities of gait and mobility: Secondary | ICD-10-CM | POA: Insufficient documentation

## 2020-01-21 DIAGNOSIS — G8929 Other chronic pain: Secondary | ICD-10-CM | POA: Insufficient documentation
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
## 2020-01-22 ENCOUNTER — Ambulatory Visit (INDEPENDENT_AMBULATORY_CARE_PROVIDER_SITE_OTHER): Payer: Self-pay | Admitting: Orthopaedic Surgery

## 2020-01-22 ENCOUNTER — Encounter: Payer: Self-pay | Admitting: Orthopaedic Surgery

## 2020-01-22 VITALS — Ht 64.5 in | Wt 155.2 lb

## 2020-01-22 DIAGNOSIS — M1712 Unilateral primary osteoarthritis, left knee: Secondary | ICD-10-CM

## 2020-01-22 DIAGNOSIS — M1711 Unilateral primary osteoarthritis, right knee: Secondary | ICD-10-CM

## 2020-01-22 NOTE — Progress Notes (Signed)
Office Visit Note   Patient: Juan Hughes           Date of Birth: [birth year]           MRN: [id number] Visit Date: 01/22/2020              Requested by: Juan Harms, PA-C [address] [place],  [place] [address] PCP: Juan Harms, PA-C   Assessment & Plan: Visit Diagnoses:  1. Primary osteoarthritis of right knee   2. Primary osteoarthritis of left knee     Plan: Impression is severe bilateral knee DJD worse on the right.  He has a significant amount of medial tibial wear and a severe varus deformity.  Given these findings do not feel that conservative treatment would be successful in providing him with any meaningful relief therefore I have recommended a total knee replacement beginning with the right 1 first.  Based on discussion risk benefits rehab recovery and alternatives to surgery he has elected to proceed with a right total knee replacement.  We will obtain preoperative cardiac clearance from his cardiologist who he is seen in a couple weeks prior to scheduling surgery.  Details of the surgery and overnight stay and PT were all reviewed in detail through the language interpreter.  Questions encouraged and answered.  Follow-Up Instructions: Return if symptoms worsen or fail to improve.   Orders:  No orders of the defined types were placed in this encounter.  No orders of the defined types were placed in this encounter.     Procedures: No procedures performed   Clinical Data: No additional findings.   Subjective: Chief Complaint  Patient presents with  . Right Knee - Pain  . Left Knee - Pain    Patient is a very pleasant 64 year old Hispanic gentleman who presents today with his wife and language interpreter for evaluation of severe bilateral knee pain worse on the right for many years.  He has obvious deformities of his legs.  He had a hypertensive stroke in February and he has been ambulating with a walker since then.  He is currently still doing  physical therapy.  He has severe pain with cracking and radiation of pain down the leg.  Voltaren gel does not help.  Cortisone injections have not provided any relief.   Review of Systems  Constitutional: Negative.   All other systems reviewed and are negative.    Objective: Vital Signs: Ht 5' 4.5" (1.638 m)   Wt 155 lb 3.2 oz (70.4 kg)   BMI 26.23 kg/m   Physical Exam Vitals and nursing note reviewed.  Constitutional:      Appearance: He is well-developed.  HENT:     Head: Normocephalic and atraumatic.  Eyes:     Pupils: Pupils are equal, round, and reactive to light.  Pulmonary:     Effort: Pulmonary effort is normal.  Abdominal:     Palpations: Abdomen is soft.  Musculoskeletal:        General: Normal range of motion.     Cervical back: Neck supple.  Skin:    General: Skin is warm.  Neurological:     Mental Status: He is alert and oriented to person, place, and time.  Psychiatric:        Behavior: Behavior normal.        Thought Content: Thought content normal.        Judgment: Judgment normal.     Ortho Exam Bilateral knees show fixed varus deformities.  Moderate restriction  range of motion with severe pain.  There is small joint effusion of both knees.  2+ crepitus with range of motion.  Significant medial joint line tenderness. Specialty Comments:  No specialty comments available.  Imaging: No results found.   PMFS History: Patient Active Problem List   Diagnosis Date Noted  . Nonischemic cardiomyopathy (HCC)   . HFrEF (heart failure with reduced ejection fraction) (HCC)   . Stroke (HCC) 06/18/2019  . Hypertension   . Left-sided weakness   . Hypokalemia   . Tobacco abuse    Past Medical History:  Diagnosis Date  . CHF (congestive heart failure) (HCC)    last ef 40-45% echo 05/2019  . HFrEF (heart failure with reduced ejection fraction) (HCC)   . Hypertension   . Stroke [facility])     Family History  Problem Relation Age of Onset  . Hypertension  Mother     History reviewed. No pertinent surgical history. Social History   Occupational History  . Not on file  Tobacco Use  . Smoking status: Former Smoker    Types: Cigars  . Smokeless tobacco: Never Used  Vaping Use  . Vaping Use: Never used  Substance and Sexual Activity  . Alcohol use: Never  . Drug use: Never  . Sexual activity: Not on file

## 2020-01-26 ENCOUNTER — Other Ambulatory Visit: Payer: Self-pay

## 2020-01-26 ENCOUNTER — Ambulatory Visit: Payer: No Typology Code available for payment source | Admitting: Physical Therapy

## 2020-01-26 ENCOUNTER — Encounter: Payer: Self-pay | Admitting: Physical Therapy

## 2020-01-26 DIAGNOSIS — G8929 Other chronic pain: Secondary | ICD-10-CM

## 2020-01-26 DIAGNOSIS — R262 Difficulty in walking, not elsewhere classified: Secondary | ICD-10-CM

## 2020-01-26 DIAGNOSIS — M25561 Pain in right knee: Secondary | ICD-10-CM

## 2020-01-26 DIAGNOSIS — R2689 Other abnormalities of gait and mobility: Secondary | ICD-10-CM
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
## 2020-01-28 ENCOUNTER — Other Ambulatory Visit: Payer: Self-pay

## 2020-01-28 ENCOUNTER — Ambulatory Visit: Payer: No Typology Code available for payment source | Admitting: Physical Therapy

## 2020-01-28 ENCOUNTER — Encounter: Payer: Self-pay | Admitting: Physical Therapy

## 2020-01-28 DIAGNOSIS — R2689 Other abnormalities of gait and mobility: Secondary | ICD-10-CM

## 2020-01-28 DIAGNOSIS — G8929 Other chronic pain: Secondary | ICD-10-CM

## 2020-01-28 DIAGNOSIS — R262 Difficulty in walking, not elsewhere classified: Secondary | ICD-10-CM

## 2020-01-28 DIAGNOSIS — M25562 Pain in left knee: Secondary | ICD-10-CM
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
## 2020-02-02 ENCOUNTER — Ambulatory Visit: Payer: No Typology Code available for payment source | Admitting: Physical Therapy

## 2020-02-02 ENCOUNTER — Encounter: Payer: Self-pay | Admitting: Physical Therapy

## 2020-02-02 ENCOUNTER — Other Ambulatory Visit: Payer: Self-pay

## 2020-02-02 DIAGNOSIS — R2689 Other abnormalities of gait and mobility: Secondary | ICD-10-CM

## 2020-02-02 DIAGNOSIS — M25562 Pain in left knee: Secondary | ICD-10-CM

## 2020-02-02 DIAGNOSIS — R262 Difficulty in walking, not elsewhere classified: Secondary | ICD-10-CM
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
## 2020-02-04 ENCOUNTER — Encounter: Payer: Self-pay | Admitting: Physical Therapy

## 2020-02-04 ENCOUNTER — Ambulatory Visit (INDEPENDENT_AMBULATORY_CARE_PROVIDER_SITE_OTHER): Payer: No Typology Code available for payment source

## 2020-02-04 ENCOUNTER — Other Ambulatory Visit (INDEPENDENT_AMBULATORY_CARE_PROVIDER_SITE_OTHER): Payer: No Typology Code available for payment source | Admitting: *Deleted

## 2020-02-04 ENCOUNTER — Other Ambulatory Visit: Payer: Self-pay

## 2020-02-04 ENCOUNTER — Ambulatory Visit: Payer: Self-pay | Admitting: Physical Therapy

## 2020-02-04 DIAGNOSIS — R262 Difficulty in walking, not elsewhere classified: Secondary | ICD-10-CM

## 2020-02-04 DIAGNOSIS — I10 Essential (primary) hypertension: Secondary | ICD-10-CM

## 2020-02-04 DIAGNOSIS — I5020 Unspecified systolic (congestive) heart failure: Secondary | ICD-10-CM

## 2020-02-04 DIAGNOSIS — I639 Cerebral infarction, unspecified: Secondary | ICD-10-CM

## 2020-02-04 DIAGNOSIS — G8929 Other chronic pain: Secondary | ICD-10-CM

## 2020-02-04 DIAGNOSIS — R2689 Other abnormalities of gait and mobility: Secondary | ICD-10-CM

## 2020-02-04 LAB — ECHOCARDIOGRAM COMPLETE
Area-P 1/2: 2.83 cm2
S' Lateral: 3.2 cm

## 2020-02-04 NOTE — Therapy (Signed)
[facility] [address], [place], [address] Phone: [phone]   Fax:  [phone]  Physical Therapy Treatment  Patient Details  Name: Juan Hughes MRN: [id number] Date of Birth: 06/23/1955 No data recorded  Encounter Date: 02/04/2020   PT End of Session - 02/04/20 1138    Visit Number 13    Number of Visits 17    Date for PT Re-Evaluation 02/19/20    PT Start Time 1120    PT Stop Time 1200    PT Time Calculation (min) 40 min    Activity Tolerance Patient tolerated treatment well    Behavior During Therapy [facility] for tasks assessed/performed           Past Medical History:  Diagnosis Date  . CHF (congestive heart failure) (HCC)    last ef 40-45% echo 05/2019  . HFrEF (heart failure with reduced ejection fraction) (HCC)   . Hypertension   . Stroke [facility])     History reviewed. No pertinent surgical history.  There were no vitals filed for this visit.   Subjective Assessment - 02/04/20 1125    Subjective Patient reports compliance with HEP and with cane, but does report some increased R knee pain with cane use. He is going to cariologist to be cleared for R knee surgery Monday, and it will be scheduled following this.    Pertinent History Pt is a 64y/o male presenting to OPPT with bilat knee pain. He ambulates into clinic with RW which he reports he has been using since CVA 06/18/19. Prior to this he used a SPC on occasion, but was mostly ind without AD. Endorses continued motor deficits on R side of body with decreased strength, denies sensory loss. He reports his knees have been hurting him for "many years" but that this pain has been made worse following CVA, mostly d/t weakness. Pt reports he is fearful of falling and has fallen 1x in the past 60months, 2 weeks ago when he was being pushed in rollator and his foot got stuck on a crack in the pavement and he fell out of the seat. Pt uses RW in home,  and rollator in the community (is able to walk about 30-66mins before needing a rest). Pt feeds, bathing, and dressing modI, showers sitting on shower seat, dresses sitting down, and eats foot that is already prepared for him. He has 5 stairs to enter/exit his home with handrail, and he negotiates this with RW. He walks everyday in his driveway. Knee pain is exacerbated by STS and walking >20-17mins and is aching in nature with "sharp pain felt deep inside the joint". Worst pain in the past week: 7/10 Best 2/10. Patient retired following stroke, and enjoys walking daily, and watching movies. Pt denies N/V, B&B changes, unexplained weight fluctuation, saddle paresthesia, fever, night sweats, or unrelenting night pain at this time.    Limitations Lifting;House hold activities;Walking    How long can you sit comfortably? unlimited    How long can you stand comfortably? 20-70mins    How long can you walk comfortably? 20-40mins    Patient Stated Goals Ambulate without AD or with can    Pain Onset More than a month ago           Ther-Ex Nustep L4 LE only setting 8 cuing to keep SPM >145for gentle strengthenign Thomas stretch2x60sec bilatwithgood carry over from prior sessions BUE flex with5#in half kneeling2x 10bilat with min cuing for  use of hips with decent carry over Hip ext in tall kneeling 2x 10 with cuing for glute activation with decent carry over Squat with 10# DB swing 2x 10 with min cuing for full hip ext with good carry over  Gait Training SPC234ft withdecent carry over of previous session, min cuing for increased step length Backward walking with SPC x3ft with CGA for safety, min cuing for full hip ext with good carry over              PT Education - 02/04/20 1131    Education Details therex form/technique, posture, gait training    Person(s) Educated Patient    Methods Explanation;Demonstration;Verbal cues    Comprehension Verbalized  understanding;Returned demonstration;Verbal cues required            PT Short Term Goals - 12/24/19 1031      PT SHORT TERM GOAL #1   Title Pt will be independent with HEP in order to improve strength and balance in order to decrease fall risk and improve function at home and work.    Baseline 12/23/19 HEP given    Time 4    Period Weeks    Status New      PT SHORT TERM GOAL #2   Title Pt will decrease 5TSTS by at least 3 seconds in order to demonstrate clinically significant improvement in LE strength    Baseline 12/23/19 30sec    Time 4    Period Weeks    Status New      PT SHORT TERM GOAL #3   Title Pt will increase to at least 0.25m/s in order to demonstrate normalized household ambulation with LRD to reduce fall risk.    Baseline 12/23/19 0.29m/s    Time 4    Period Weeks    Status New             PT Long Term Goals - 01/01/20 1007      PT LONG TERM GOAL #1   Title Pt will increase to at least 1.102m/s in order to demonstrate normalized community ambulation with LRD to reduce fall risk.    Baseline 12/23/19 0.36m/s    Time 8    Period Weeks    Status New      PT LONG TERM GOAL #2   Title Pt will decrease 5TSTS by to at least 12seconds in order to demonstrate normalized LE strength needed for safe, ind transfers    Baseline 12/23/19 30sec    Time 8    Period Weeks    Status New      PT LONG TERM GOAL #3   Title Pt will demonstrate TUG time of 20sec or less in order to demonstrate decreased fall risk with ind basic transfers    Baseline 12/23/19 30.38sec    Time 8    Period Weeks    Status New      PT LONG TERM GOAL #4   Title Pt will decrease worst pain as reported on NPRS by at least 3 points in order to demonstrate clinically significant reduction in pain.    Baseline 12/23/19 7/10 bilat knee pain with OOB activity    Time 8    Period Weeks    Status New      PT LONG TERM GOAL #5   Title Patient will increase FOTO score to 34 to demonstrate predicted  increase in functional mobility to complete ADLs    Baseline 12/23/19 56    Time  8    Period Weeks    Status New      Additional Long Term Goals   Additional Long Term Goals Yes      PT LONG TERM GOAL #6   Title Patient will demonstrate BERG score of 45 or more to demonstrate decreased fall risk    Baseline 01/01/20 41/56    Time 8    Period Weeks    Status New                 Plan - 02/04/20 1144    Clinical Impression Statement PT continued therex progression toward increased extensor/upright posture and increased gait distance with success. Patient is able to comply with all cuing for proper technique of therex with good carry over and is motivated throughout session, without increased pain. PT will continue progression as able.    Personal Factors and Comorbidities Comorbidity 1;Comorbidity 2;Comorbidity 3+;Education    Comorbidities HTN, CHF, CVA Jan 2021    Examination-Activity Limitations Bathing;Lift;Dressing;Sit;Stand;Transfers    Examination-Participation Restrictions Cleaning;Community Activity;Yard Work;Driving    Stability/Clinical Decision Making Evolving/Moderate complexity    Clinical Decision Making Moderate    Rehab Potential Good    PT Frequency 2x / week    PT Duration 3 weeks    PT Treatment/Interventions ADLs/Self Care Home Management;Cryotherapy;Ultrasound;Gait training;Stair training;Therapeutic activities;Therapeutic exercise;Neuromuscular re-education;Patient/family education;Manual techniques;Orthotic Fit/Training;Dry needling;Joint Manipulations;Vestibular;Spinal Manipulations;Passive range of motion;Balance training;Functional mobility training;Moist Heat;Electrical Stimulation;DME Instruction    PT Next Visit Plan continue ext strength    PT Home Exercise Plan STS, standing hip abd, ext, and heel raises    Consulted and Agree with Plan of Care Patient           Patient will benefit from skilled therapeutic intervention in order to improve the  following deficits and impairments:  Abnormal gait, Decreased activity tolerance, Decreased endurance, Decreased range of motion, Decreased strength, Increased fascial restricitons, Improper body mechanics, Impaired sensation, Pain, Postural dysfunction, Impaired tone, Impaired flexibility, Increased muscle spasms, Difficulty walking, Decreased safety awareness, Decreased mobility, Decreased coordination, Decreased balance, Impaired UE functional use  Visit Diagnosis: Other abnormalities of gait and mobility  Difficulty in walking, not elsewhere classified  Chronic pain of left knee  Chronic pain of right knee     Problem List Patient Active Problem List   Diagnosis Date Noted  . Nonischemic cardiomyopathy (HCC)   . HFrEF (heart failure with reduced ejection fraction) (HCC)   . Stroke (HCC) 06/18/2019  . Hypertension   . Left-sided weakness   . Hypokalemia   . Tobacco abuse     Hilda Lias 02/04/2020, 12:02 PM  [facility] PH[facility]ICAL AND SPORTS MEDICINE [address], [place], [address] Phone: [phone]   Fax:  [phone]  Name: Juan Hughes MRN: [id number] Date of Birth: 10-Nov-1955

## 2020-02-05 LAB — LIPID PANEL
Chol/HDL Ratio: 3.2 ratio (ref 0.0–5.0)
Cholesterol, Total: 125 mg/dL (ref 100–199)
HDL: 39 mg/dL — ABNORMAL LOW (ref >39)
LDL Chol Calc (NIH): 52 mg/dL (ref 0–99)
Triglycerides: 209 mg/dL — ABNORMAL HIGH (ref 0–149)
VLDL Cholesterol Cal: 34 mg/dL (ref 5–40)

## 2020-02-08 ENCOUNTER — Ambulatory Visit (INDEPENDENT_AMBULATORY_CARE_PROVIDER_SITE_OTHER): Payer: No Typology Code available for payment source | Admitting: Cardiology

## 2020-02-08 ENCOUNTER — Other Ambulatory Visit: Payer: Self-pay

## 2020-02-08 ENCOUNTER — Encounter: Payer: Self-pay | Admitting: Cardiology

## 2020-02-08 VITALS — BP 106/60 | HR 65 | Ht 65.0 in | Wt 157.2 lb

## 2020-02-08 DIAGNOSIS — I10 Essential (primary) hypertension: Secondary | ICD-10-CM

## 2020-02-08 DIAGNOSIS — I639 Cerebral infarction, unspecified: Secondary | ICD-10-CM

## 2020-02-08 DIAGNOSIS — Z01818 Encounter for other preprocedural examination: Secondary | ICD-10-CM

## 2020-02-08 DIAGNOSIS — I5020 Unspecified systolic (congestive) heart failure: Secondary | ICD-10-CM

## 2020-02-08 MED ORDER — SPIRONOLACTONE 25 MG PO TABS: 25.0000 mg | ORAL_TABLET | Freq: Every day | ORAL | 5 refills | Status: DC

## 2020-02-08 NOTE — Progress Notes (Signed)
Cardiology Office Note:    Date:  02/08/2020   ID:  Juan Hughes, DOB 09/11/1955, MRN [id number]  PCP:  Gorden Harms, PA-C  Cardiologist:  Debbe Odea, MD  Electrophysiologist:  None   Referring MD: Gorden Harms, PA-C   Chief Complaint  Patient presents with  . office visit    3 month F/U-Discuss echo results and preop clearance for knee surgery; Meds verbally reviewed with patient.    History of Present Illness:   Spanish interpreter used for this visit.  Juan Hughes is a 64 y.o. male with a hx of hypertension, heart failure reduced ejection fraction, EF 40 to 45%, CVA who presents for follow-up.  The patient is being seen for nonischemic cardiomyopathy and hypertension.  Medications are being titrated, with Coreg further titrated and spironolactone started after last visit.  He states doing okay, denies any medication adverse effects.  Denies dizziness.  His blood pressure typically runs low in the mornings 105.  Right knee arthroplasty is being planned.  He is also seeking surgical evaluation for this. Repeat echocardiogram was ordered to evaluate EF.  He has no concerns today.  Historical notes Patient admitted to the hospital on 06/23/2019 with left-sided weakness.  Eventually found to have right lacunar infarct on brain MRI.  Work-up with echocardiogram showed reduced ejection fraction with EF 40 to 45%.  Pharmacologic myocardial perfusion stress test did not show any ischemia or scar.  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  Coreg, losartan, hydralazine was started.  Cardiac monitor was placed and did not show any evidence of atrial flutter or atrial fibrillation.    Past Medical History:  Diagnosis Date  . CHF (congestive heart failure) (HCC)    last ef 40-45% echo 05/2019  . HFrEF (heart failure with reduced ejection fraction) (HCC)   . Hypertension   . Stroke [facility])     History  reviewed. No pertinent surgical history.  Current Medications: Current Meds  Medication Sig  . acetaminophen (TYLENOL) 650 MG CR tablet Take 650 mg by mouth every 8 (eight) hours as needed for pain.  Marland Kitchen aspirin EC 325 MG EC tablet Take 1 tablet (325 mg total) by mouth daily.  Marland Kitchen atorvastatin (LIPITOR) 80 MG tablet Take 1 tablet (80 mg total) by mouth daily at 6 PM.  . carvedilol (COREG) 25 MG tablet Take 1 tablet (25 mg total) by mouth 2 (two) times daily.  . diclofenac Sodium (VOLTAREN) 1 % GEL Apply topically as directed.  . hydrALAZINE (APRESOLINE) 100 MG tablet Take 1 tablet (100 mg total) by mouth every 8 (eight) hours.  Marland Kitchen losartan (COZAAR) 100 MG tablet Take 1 tablet (100 mg total) by mouth daily.  . Melatonin 1 MG CAPS Take 3 mg by mouth at bedtime.   . [DISCONTINUED] hydrochlorothiazide (HYDRODIURIL) 25 MG tablet Take 0.5 tablets (12.5 mg total) by mouth daily.     Allergies:   Patient has no known allergies.   Social History   Socioeconomic History  . Marital status: Married    Spouse name: Not on file  . Number of children: Not on file  . Years of education: Not on file  . Highest education level: Not on file  Occupational History  . Not on file  Tobacco Use  . Smoking status: Former Smoker    Types: Cigars  . Smokeless tobacco: Never Used  Vaping Use  . Vaping Use: Never used  Substance and Sexual Activity  . Alcohol  use: Never  . Drug use: Never  . Sexual activity: Not on file  Other Topics Concern  . Not on file  Social History Narrative  . Not on file   Social Determinants of Health   Financial Resource Strain:   . Difficulty of Paying Living Expenses: Not on file  Food Insecurity:   . Worried About Programme researcher, broadcasting/film/video in the Last Year: Not on file  . Ran Out of Food in the Last Year: Not on file  Transportation Needs:   . Lack of Transportation (Medical): Not on file  . Lack of Transportation (Non-Medical): Not on file  Physical Activity:   . Days of  Exercise per Week: Not on file  . Minutes of Exercise per Session: Not on file  Stress:   . Feeling of Stress : Not on file  Social Connections:   . Frequency of Communication with Friends and Family: Not on file  . Frequency of Social Gatherings with Friends and Family: Not on file  . Attends Religious Services: Not on file  . Active Member of Clubs or Organizations: Not on file  . Attends Banker Meetings: Not on file  . Marital Status: Not on file     Family History: The patient's family history includes Hypertension in his mother.  ROS:   Please see the history of present illness.     All other systems reviewed and are negative.  EKGs/Labs/Other Studies Reviewed:    The following studies were reviewed today:   EKG:  EKG is  ordered today.  The ekg ordered today demonstrates sinus rhythm, possible LVH per voltage criteria.  Recent Labs: 06/20/2019: ALT 11 06/22/2019: TSH 1.997 06/23/2019: Hemoglobin 14.9; Platelets 191 07/08/2019: Magnesium 1.8 01/12/2020: BUN 24; Creatinine, Ser 1.20; Potassium 3.5; Sodium 140  Recent Lipid Panel    Component Value Date/Time   CHOL 125 02/04/2020 0915   TRIG 209 (H) 02/04/2020 0915   HDL 39 (L) 02/04/2020 0915   CHOLHDL 3.2 02/04/2020 0915   CHOLHDL 4.1 06/19/2019 0422   VLDL 23 06/19/2019 0422   LDLCALC 52 02/04/2020 0915    Physical Exam:    VS:  BP 106/60 (BP Location: Left Arm, Patient Position: Sitting, Cuff Size: Normal)   Pulse 65   Ht 5\' 5"  (1.651 m)   Wt 157 lb 4 oz (71.3 kg)   SpO2 96%   BMI 26.17 kg/m     Wt Readings from Last 3 Encounters:  02/08/20 157 lb 4 oz (71.3 kg)  01/22/20 155 lb 3.2 oz (70.4 kg)  11/03/19 148 lb 6 oz (67.3 kg)     GEN:  Well nourished, well developed in no acute distress HEENT: Normal NECK: No JVD; No carotid bruits LYMPHATICS: No lymphadenopathy CARDIAC: RRR, no murmurs, rubs, gallops RESPIRATORY:  Clear to auscultation without rales, wheezing or rhonchi  ABDOMEN:  Soft, non-tender, non-distended MUSCULOSKELETAL:  No edema; No deformity  SKIN: Warm and dry NEUROLOGIC:  Alert and oriented x 3 PSYCHIATRIC:  Normal affect   ASSESSMENT:    1. HFrEF (heart failure with reduced ejection fraction) (HCC)   2. Essential hypertension   3. Cerebrovascular accident (CVA), unspecified mechanism (HCC)   4. Pre-op evaluation    PLAN:    In order of problems listed above:  1. Patient with history of nonischemic cardiomyopathy, prior EF 40 to 45%, echo on 01/2020 showed normal EF, 55 to 60%.  Prior Myoview with no evidence for ischemia.  Increase Aldactone to 25 mg  daily.  Continue Coreg 25 mg twice daily, continue losartan 100. 2. History of hypertension, blood pressure controlled.  Continue Coreg, losartan, Aldactone as above.  Stop HCTZ as blood pressures sometimes run low. 3. History of CVA, right lacunar infarct. Continue aspirin, Lipitor.  Last LDL and total cholesterol controlled. 4. Right knee arthroplasty is being planned.  Okay to perform procedure from a cardiac perspective without any additional testing or medication changes..  Prior [facility] with no evidence for ischemia, recent echocardiogram showed normal ejection fraction.  Follow-up in 6 months  Total encounter time 43 minutes  Greater than 50% was spent in counseling and coordination of care with the patient Time spent including but not limited to medication management, Usage of interpreter services,    This note was generated in part or whole with voice recognition software. Voice recognition is usually quite accurate but there are transcription errors that can and very often do occur. I apologize for any typographical errors that were not detected and corrected.  Medication Adjustments/Labs and Tests Ordered: Current medicines are reviewed at length with the patient today.  Concerns regarding medicines are outlined above.  Orders Placed This Encounter  Procedures  . EKG 12-Lead    Meds ordered this encounter  Medications  . DISCONTD: spironolactone (ALDACTONE) 25 MG tablet    Sig: Take 1 tablet (25 mg total) by mouth daily.    Dispense:  30 tablet    Refill:  5  . spironolactone (ALDACTONE) 25 MG tablet    Sig: Take 1 tablet (25 mg total) by mouth daily.    Dispense:  30 tablet    Refill:  5    Patient Instructions  Medication Instructions:   Your physician has recommended you make the following change in your medication:   1)  STOP taking hydrochlorothiazide (HYDRODIURIL). 2)  INCREASE your spironolactone (ALDACTONE) 25 MG tablet: Take 1 tablet (25 mg total) by mouth daily.  *If you need a refill on your cardiac medications before your next appointment, please call your pharmacy*   Lab Work: None Ordered If you have labs (blood work) drawn today and your tests are completely normal, you will receive your results only by: Marland Kitchen MyChart Message (if you have MyChart) OR . A paper copy in the mail If you have any lab test that is abnormal or we need to change your treatment, we will call you to review the results.   Testing/Procedures: None Ordered   Follow-Up: At [facility], you and your health needs are our priority.  As part of our continuing mission to provide you with exceptional heart care, we have created designated Provider Care Teams.  These Care Teams include your primary Cardiologist (physician) and Advanced Practice Providers (APPs -  Physician Assistants and Nurse Practitioners) who all work together to provide you with the care you need, when you need it.  We recommend signing up for the patient portal called "MyChart".  Sign up information is provided on this After Visit Summary.  MyChart is used to connect with patients for Virtual Visits (Telemedicine).  Patients are able to view lab/test results, encounter notes, upcoming appointments, etc.  Non-urgent messages can be sent to your provider as well.   To learn more about what you can  do with MyChart, go to [email or web address].    Your next appointment:   6 month(s)  The format for your next appointment:   In Person  Provider:   Debbe Odea, MD   Other Instructions  Signed, Debbe Odea, MD  02/08/2020 9:33 AM    [facility]

## 2020-02-08 NOTE — Patient Instructions (Signed)
Medication Instructions:   Your physician has recommended you make the following change in your medication:   1)  STOP taking hydrochlorothiazide (HYDRODIURIL). 2)  INCREASE your spironolactone (ALDACTONE) 25 MG tablet: Take 1 tablet (25 mg total) by mouth daily.  *If you need a refill on your cardiac medications before your next appointment, please call your pharmacy*   Lab Work: None Ordered If you have labs (blood work) drawn today and your tests are completely normal, you will receive your results only by: Marland Kitchen MyChart Message (if you have MyChart) OR . A paper copy in the mail If you have any lab test that is abnormal or we need to change your treatment, we will call you to review the results.   Testing/Procedures: None Ordered   Follow-Up: At [facility], you and your health needs are our priority.  As part of our continuing mission to provide you with exceptional heart care, we have created designated Provider Care Teams.  These Care Teams include your primary Cardiologist (physician) and Advanced Practice Providers (APPs -  Physician Assistants and Nurse Practitioners) who all work together to provide you with the care you need, when you need it.  We recommend signing up for the patient portal called "MyChart".  Sign up information is provided on this After Visit Summary.  MyChart is used to connect with patients for Virtual Visits (Telemedicine).  Patients are able to view lab/test results, encounter notes, upcoming appointments, etc.  Non-urgent messages can be sent to your provider as well.   To learn more about what you can do with MyChart, go to [email or web address].    Your next appointment:   6 month(s)  The format for your next appointment:   In Person  Provider:   Debbe Odea, MD   Other Instructions

## 2020-02-09 ENCOUNTER — Ambulatory Visit: Payer: Self-pay | Admitting: Physical Therapy

## 2020-02-09 ENCOUNTER — Encounter: Payer: Self-pay | Admitting: Physical Therapy

## 2020-02-09 DIAGNOSIS — R2689 Other abnormalities of gait and mobility: Secondary | ICD-10-CM

## 2020-02-09 DIAGNOSIS — G8929 Other chronic pain: Secondary | ICD-10-CM

## 2020-02-09 DIAGNOSIS — M25562 Pain in left knee: Secondary | ICD-10-CM

## 2020-02-09 DIAGNOSIS — R262 Difficulty in walking, not elsewhere classified: Secondary | ICD-10-CM
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
## 2020-02-11 ENCOUNTER — Ambulatory Visit: Payer: Self-pay | Admitting: Physical Therapy

## 2020-02-11 ENCOUNTER — Encounter: Payer: Self-pay | Admitting: Physical Therapy

## 2020-02-11 ENCOUNTER — Other Ambulatory Visit: Payer: Self-pay

## 2020-02-11 DIAGNOSIS — R2689 Other abnormalities of gait and mobility: Secondary | ICD-10-CM

## 2020-02-11 DIAGNOSIS — M25562 Pain in left knee: Secondary | ICD-10-CM

## 2020-02-11 DIAGNOSIS — M25561 Pain in right knee: Secondary | ICD-10-CM

## 2020-02-11 DIAGNOSIS — R262 Difficulty in walking, not elsewhere classified: Secondary | ICD-10-CM
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
## 2020-02-16 ENCOUNTER — Ambulatory Visit: Payer: Self-pay

## 2020-02-16 ENCOUNTER — Other Ambulatory Visit: Payer: Self-pay

## 2020-02-16 DIAGNOSIS — G8929 Other chronic pain: Secondary | ICD-10-CM

## 2020-02-16 DIAGNOSIS — R262 Difficulty in walking, not elsewhere classified: Secondary | ICD-10-CM

## 2020-02-16 DIAGNOSIS — R2689 Other abnormalities of gait and mobility: Secondary | ICD-10-CM

## 2020-02-16 DIAGNOSIS — M25562 Pain in left knee: Secondary | ICD-10-CM
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
## 2020-02-18 ENCOUNTER — Other Ambulatory Visit: Payer: Self-pay

## 2020-02-18 ENCOUNTER — Ambulatory Visit: Payer: Self-pay

## 2020-02-18 DIAGNOSIS — G8929 Other chronic pain: Secondary | ICD-10-CM

## 2020-02-18 DIAGNOSIS — R262 Difficulty in walking, not elsewhere classified: Secondary | ICD-10-CM

## 2020-02-18 DIAGNOSIS — R2689 Other abnormalities of gait and mobility: Secondary | ICD-10-CM

## 2020-02-18 DIAGNOSIS — M25562 Pain in left knee: Secondary | ICD-10-CM
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
## 2020-02-19 ENCOUNTER — Encounter (HOSPITAL_BASED_OUTPATIENT_CLINIC_OR_DEPARTMENT_OTHER): Payer: Self-pay | Admitting: Orthopaedic Surgery

## 2020-02-19 ENCOUNTER — Other Ambulatory Visit: Payer: Self-pay

## 2020-02-19 ENCOUNTER — Ambulatory Visit: Payer: No Typology Code available for payment source

## 2020-02-20 ENCOUNTER — Other Ambulatory Visit (HOSPITAL_COMMUNITY): Admission: RE | Admit: 2020-02-20 | Payer: No Typology Code available for payment source | Source: Ambulatory Visit

## 2020-02-22 ENCOUNTER — Other Ambulatory Visit (HOSPITAL_COMMUNITY)
Admission: RE | Admit: 2020-02-22 | Discharge: 2020-02-22 | Disposition: A | Discharge status: Home / Self Care | Payer: No Typology Code available for payment source | Source: Ambulatory Visit | Attending: Orthopaedic Surgery | Admitting: Orthopaedic Surgery

## 2020-02-22 ENCOUNTER — Other Ambulatory Visit: Payer: Self-pay | Admitting: Physician Assistant

## 2020-02-22 ENCOUNTER — Encounter (HOSPITAL_BASED_OUTPATIENT_CLINIC_OR_DEPARTMENT_OTHER)
Admission: RE | Admit: 2020-02-22 | Discharge: 2020-02-22 | Disposition: A | Discharge status: Home / Self Care | Payer: No Typology Code available for payment source | Source: Ambulatory Visit | Attending: Orthopaedic Surgery | Admitting: Orthopaedic Surgery

## 2020-02-22 ENCOUNTER — Ambulatory Visit
Admission: RE | Admit: 2020-02-22 | Discharge: 2020-02-22 | Disposition: A | Discharge status: Home / Self Care | Payer: No Typology Code available for payment source | Source: Ambulatory Visit | Attending: Physician Assistant | Admitting: Physician Assistant

## 2020-02-22 ENCOUNTER — Ambulatory Visit (INDEPENDENT_AMBULATORY_CARE_PROVIDER_SITE_OTHER): Payer: Self-pay

## 2020-02-22 DIAGNOSIS — Z01812 Encounter for preprocedural laboratory examination: Secondary | ICD-10-CM | POA: Insufficient documentation

## 2020-02-22 DIAGNOSIS — Z20822 Contact with and (suspected) exposure to covid-19: Secondary | ICD-10-CM | POA: Insufficient documentation

## 2020-02-22 DIAGNOSIS — M1711 Unilateral primary osteoarthritis, right knee: Secondary | ICD-10-CM

## 2020-02-22 LAB — URINALYSIS, ROUTINE W REFLEX MICROSCOPIC
Bacteria, UA: NONE SEEN
Bilirubin Urine: NEGATIVE
Glucose, UA: NEGATIVE mg/dL
Ketones, ur: NEGATIVE mg/dL
Leukocytes,Ua: NEGATIVE
Nitrite: NEGATIVE
Protein, ur: NEGATIVE mg/dL
Specific Gravity, Urine: 1.011 (ref 1.005–1.030)
pH: 5 (ref 5.0–8.0)

## 2020-02-22 LAB — SURGICAL PCR SCREEN
MRSA, PCR: NEGATIVE
Staphylococcus aureus: NEGATIVE

## 2020-02-22 LAB — CBC WITH DIFFERENTIAL/PLATELET
Abs Immature Granulocytes: 0.02 10*3/uL (ref 0.00–0.07)
Basophils Absolute: 0.1 10*3/uL (ref 0.0–0.1)
Basophils Relative: 1 %
Eosinophils Absolute: 0.2 10*3/uL (ref 0.0–0.5)
Eosinophils Relative: 3 %
HCT: 40.9 % (ref 39.0–52.0)
Hemoglobin: 12.6 g/dL — ABNORMAL LOW (ref 13.0–17.0)
Immature Granulocytes: 0 %
Lymphocytes Relative: 26 %
Lymphs Abs: 1.4 10*3/uL (ref 0.7–4.0)
MCH: 28.4 pg (ref 26.0–34.0)
MCHC: 30.8 g/dL (ref 30.0–36.0)
MCV: 92.1 fL (ref 80.0–100.0)
Monocytes Absolute: 0.5 10*3/uL (ref 0.1–1.0)
Monocytes Relative: 8 %
Neutro Abs: 3.5 10*3/uL (ref 1.7–7.7)
Neutrophils Relative %: 62 %
Platelets: 244 10*3/uL (ref 150–400)
RBC: 4.44 MIL/uL (ref 4.22–5.81)
RDW: 12.9 % (ref 11.5–15.5)
WBC: 5.6 10*3/uL (ref 4.0–10.5)
nRBC: 0 % (ref 0.0–0.2)

## 2020-02-22 LAB — SARS CORONAVIRUS 2 (TAT 6-24 HRS): SARS Coronavirus 2: NEGATIVE

## 2020-02-22 LAB — COMPREHENSIVE METABOLIC PANEL
ALT: 19 U/L (ref 0–44)
AST: 16 U/L (ref 15–41)
Albumin: 4.2 g/dL (ref 3.5–5.0)
Alkaline Phosphatase: 76 U/L (ref 38–126)
Anion gap: 9 (ref 5–15)
BUN: 30 mg/dL — ABNORMAL HIGH (ref 8–23)
CO2: 22 mmol/L (ref 22–32)
Calcium: 9.6 mg/dL (ref 8.9–10.3)
Chloride: 108 mmol/L (ref 98–111)
Creatinine, Ser: 1.64 mg/dL — ABNORMAL HIGH (ref 0.61–1.24)
GFR calc Af Amer: 51 mL/min — ABNORMAL LOW (ref >60)
GFR calc non Af Amer: 44 mL/min — ABNORMAL LOW (ref >60)
Glucose, Bld: 106 mg/dL — ABNORMAL HIGH (ref 70–99)
Potassium: 4.9 mmol/L (ref 3.5–5.1)
Sodium: 139 mmol/L (ref 135–145)
Total Bilirubin: 0.5 mg/dL (ref 0.3–1.2)
Total Protein: 7.1 g/dL (ref 6.5–8.1)

## 2020-02-22 LAB — APTT: aPTT: 33 seconds (ref 24–36)

## 2020-02-22 LAB — PROTIME-INR
INR: 1 (ref 0.8–1.2)
Prothrombin Time: 13 seconds (ref 11.4–15.2)

## 2020-02-22 LAB — TYPE AND SCREEN
ABO/RH(D): O POS
Antibody Screen: NEGATIVE

## 2020-02-22 MED ORDER — CELECOXIB 200 MG PO CAPS: 200.0000 mg | ORAL_CAPSULE | Freq: Two times a day (BID) | ORAL | 2 refills | Status: DC

## 2020-02-22 MED ORDER — RIVAROXABAN 10 MG PO TABS: 10.0000 mg | ORAL_TABLET | Freq: Every day | ORAL | 0 refills | Status: DC

## 2020-02-22 MED ORDER — METHOCARBAMOL 750 MG PO TABS: 750.0000 mg | ORAL_TABLET | Freq: Two times a day (BID) | ORAL | 0 refills | Status: DC | PRN

## 2020-02-22 MED ORDER — DOCUSATE SODIUM 100 MG PO CAPS: 100.0000 mg | ORAL_CAPSULE | Freq: Every day | ORAL | 2 refills | Status: DC | PRN

## 2020-02-22 MED ORDER — ONDANSETRON HCL 4 MG PO TABS: 4.0000 mg | ORAL_TABLET | Freq: Three times a day (TID) | ORAL | 0 refills | Status: DC | PRN

## 2020-02-22 NOTE — Progress Notes (Signed)

## 2020-02-22 NOTE — Progress Notes (Signed)
Patient came in today for blood work

## 2020-02-23 ENCOUNTER — Telehealth: Payer: Self-pay | Admitting: Physician Assistant

## 2020-02-23 ENCOUNTER — Telehealth: Payer: Self-pay | Admitting: *Deleted

## 2020-02-23 DIAGNOSIS — M1711 Unilateral primary osteoarthritis, right knee: Secondary | ICD-10-CM

## 2020-02-23 LAB — PREALBUMIN: Prealbumin: 38 mg/dL (ref 21–43)

## 2020-02-23 LAB — EXTRA LAV TOP TUBE

## 2020-02-23 NOTE — Telephone Encounter (Signed)
JJ called from the pharmacy. She says that the Xarelto prescribed missed with the Celebrex and Asprin he takes, will cause bleeding out. She would like Mardella Layman to call her. [phone]

## 2020-02-23 NOTE — Telephone Encounter (Signed)
[facility] office RNCM spoke with patient prior to surgery to ensure all HHPT and/or OPPT is set up for after surgery. Spoke with [facility] and referral for home CPM was made. Due to patient being [facility] [employer] financial assistance, discussed that [facility] may not be an option. Referral made to [facility], but they are unsure at this time of staffing in his home location. They will check and get back with CM. If no HH is available for patient, then will set up with OPPT at [facility]. He has been established at this location prior to surgery. Discussed with staff there and will notify if [facility] is not available so that he may begin OPPT ASAP after surgery. Reviewed via interpreter [phone]) with patient all post-op instructions. Patient is agreeable to either HHPT or OPPT. For questions, contact Ralph Dowdy, RN Case Manager [phone].

## 2020-02-23 NOTE — Telephone Encounter (Signed)
Ok.  He will take xarelto instead of aspirin.  Do not have him pick up celebrex.  Make sure patient aware if that is ok

## 2020-02-24 ENCOUNTER — Observation Stay (HOSPITAL_COMMUNITY): Payer: No Typology Code available for payment source

## 2020-02-24 ENCOUNTER — Encounter (HOSPITAL_BASED_OUTPATIENT_CLINIC_OR_DEPARTMENT_OTHER)
Admission: RE | Disposition: A | Discharge status: Home / Self Care | Payer: Self-pay | Source: Home / Self Care | Attending: Orthopaedic Surgery

## 2020-02-24 ENCOUNTER — Other Ambulatory Visit: Payer: Self-pay | Admitting: *Deleted

## 2020-02-24 ENCOUNTER — Ambulatory Visit (HOSPITAL_COMMUNITY): Payer: No Typology Code available for payment source

## 2020-02-24 ENCOUNTER — Encounter (HOSPITAL_BASED_OUTPATIENT_CLINIC_OR_DEPARTMENT_OTHER): Payer: Self-pay | Admitting: Orthopaedic Surgery

## 2020-02-24 ENCOUNTER — Ambulatory Visit (HOSPITAL_BASED_OUTPATIENT_CLINIC_OR_DEPARTMENT_OTHER): Payer: No Typology Code available for payment source | Admitting: Anesthesiology

## 2020-02-24 ENCOUNTER — Other Ambulatory Visit: Payer: Self-pay

## 2020-02-24 ENCOUNTER — Other Ambulatory Visit: Payer: Self-pay | Admitting: Physician Assistant

## 2020-02-24 ENCOUNTER — Observation Stay (HOSPITAL_BASED_OUTPATIENT_CLINIC_OR_DEPARTMENT_OTHER)
Admission: RE | Admit: 2020-02-24 | Discharge: 2020-02-25 | Disposition: A | Discharge status: Home / Self Care | Payer: No Typology Code available for payment source | Attending: Orthopaedic Surgery | Admitting: Orthopaedic Surgery

## 2020-02-24 DIAGNOSIS — M1711 Unilateral primary osteoarthritis, right knee: Secondary | ICD-10-CM

## 2020-02-24 DIAGNOSIS — Z7982 Long term (current) use of aspirin: Secondary | ICD-10-CM | POA: Insufficient documentation

## 2020-02-24 DIAGNOSIS — Z96651 Presence of right artificial knee joint: Secondary | ICD-10-CM

## 2020-02-24 DIAGNOSIS — Z87891 Personal history of nicotine dependence: Secondary | ICD-10-CM | POA: Insufficient documentation

## 2020-02-24 DIAGNOSIS — I110 Hypertensive heart disease with heart failure: Secondary | ICD-10-CM | POA: Insufficient documentation

## 2020-02-24 DIAGNOSIS — I5020 Unspecified systolic (congestive) heart failure: Secondary | ICD-10-CM | POA: Insufficient documentation

## 2020-02-24 DIAGNOSIS — Z79899 Other long term (current) drug therapy: Secondary | ICD-10-CM | POA: Insufficient documentation

## 2020-02-24 DIAGNOSIS — M21161 Varus deformity, not elsewhere classified, right knee: Secondary | ICD-10-CM | POA: Insufficient documentation

## 2020-02-24 HISTORY — DX: Unilateral primary osteoarthritis, right knee: M17.11

## 2020-02-24 HISTORY — DX: Gastro-esophageal reflux disease without esophagitis: K21.9

## 2020-02-24 HISTORY — PX: TOTAL KNEE ARTHROPLASTY: SHX125

## 2020-02-24 LAB — ABO/RH: ABO/RH(D): O POS

## 2020-02-24 SURGERY — ARTHROPLASTY, KNEE, TOTAL
Anesthesia: Spinal | Site: Knee | Laterality: Right

## 2020-02-24 MED ORDER — VANCOMYCIN HCL 1000 MG IV SOLR
INTRAVENOUS | Status: DC | PRN
  Administered 2020-02-24: 1000 mg via TOPICAL

## 2020-02-24 MED ORDER — ALUM & MAG HYDROXIDE-SIMETH 200-200-20 MG/5ML PO SUSP: 30.0000 mL | ORAL | Status: DC | PRN

## 2020-02-24 MED ORDER — ONDANSETRON HCL 4 MG PO TABS: 4.0000 mg | ORAL_TABLET | Freq: Four times a day (QID) | ORAL | Status: DC | PRN

## 2020-02-24 MED ORDER — ACETAMINOPHEN 500 MG PO TABS
1000.0000 mg | ORAL_TABLET | Freq: Four times a day (QID) | ORAL | Status: DC
  Administered 2020-02-24 – 2020-02-25 (×3): 1000 mg via ORAL
  Filled 2020-02-24 (×3): qty 2

## 2020-02-24 MED ORDER — CEFAZOLIN SODIUM-DEXTROSE 2-4 GM/100ML-% IV SOLN
INTRAVENOUS | Status: AC
  Filled 2020-02-24: qty 100

## 2020-02-24 MED ORDER — BUPIVACAINE LIPOSOME 1.3 % IJ SUSP
INTRAMUSCULAR | Status: DC | PRN
  Administered 2020-02-24: 20 mL

## 2020-02-24 MED ORDER — DIPHENHYDRAMINE HCL 12.5 MG/5ML PO ELIX: 25.0000 mg | ORAL_SOLUTION | ORAL | Status: DC | PRN

## 2020-02-24 MED ORDER — FENTANYL CITRATE (PF) 100 MCG/2ML IJ SOLN
100.0000 ug | Freq: Once | INTRAMUSCULAR | Status: AC
  Administered 2020-02-24: 50 ug via INTRAVENOUS

## 2020-02-24 MED ORDER — BUPIVACAINE-EPINEPHRINE 0.25% -1:200000 IJ SOLN
INTRAMUSCULAR | Status: DC | PRN
  Administered 2020-02-24: 20 mL

## 2020-02-24 MED ORDER — METHOCARBAMOL 1000 MG/10ML IJ SOLN
500.0000 mg | Freq: Four times a day (QID) | INTRAVENOUS | Status: DC | PRN
  Filled 2020-02-24: qty 5

## 2020-02-24 MED ORDER — ONDANSETRON HCL 4 MG/2ML IJ SOLN: 4.0000 mg | Freq: Four times a day (QID) | INTRAMUSCULAR | Status: DC | PRN

## 2020-02-24 MED ORDER — ONDANSETRON HCL 4 MG/2ML IJ SOLN
INTRAMUSCULAR | Status: DC | PRN
  Administered 2020-02-24: 4 mg via INTRAVENOUS

## 2020-02-24 MED ORDER — BUPIVACAINE IN DEXTROSE 0.75-8.25 % IT SOLN
INTRATHECAL | Status: DC | PRN
  Administered 2020-02-24: 1.6 mL via INTRATHECAL

## 2020-02-24 MED ORDER — SODIUM CHLORIDE 0.9% FLUSH
INTRAVENOUS | Status: DC | PRN
  Administered 2020-02-24: 20 mL

## 2020-02-24 MED ORDER — PROPOFOL 10 MG/ML IV BOLUS
INTRAVENOUS | Status: AC
  Filled 2020-02-24: qty 20

## 2020-02-24 MED ORDER — CEFAZOLIN SODIUM-DEXTROSE 2-4 GM/100ML-% IV SOLN
2.0000 g | Freq: Four times a day (QID) | INTRAVENOUS | Status: AC
  Administered 2020-02-24 – 2020-02-25 (×2): 2 g via INTRAVENOUS
  Filled 2020-02-24 (×2): qty 100

## 2020-02-24 MED ORDER — OXYCODONE HCL 5 MG/5ML PO SOLN: 5.0000 mg | Freq: Once | ORAL | Status: DC | PRN

## 2020-02-24 MED ORDER — CARVEDILOL 25 MG PO TABS
25.0000 mg | ORAL_TABLET | Freq: Two times a day (BID) | ORAL | Status: DC
  Filled 2020-02-24: qty 1

## 2020-02-24 MED ORDER — DOCUSATE SODIUM 100 MG PO CAPS
100.0000 mg | ORAL_CAPSULE | Freq: Two times a day (BID) | ORAL | Status: DC
  Administered 2020-02-24: 100 mg via ORAL
  Filled 2020-02-24: qty 1

## 2020-02-24 MED ORDER — DEXAMETHASONE SODIUM PHOSPHATE 10 MG/ML IJ SOLN: 10.0000 mg | Freq: Once | INTRAMUSCULAR | Status: DC

## 2020-02-24 MED ORDER — ONDANSETRON HCL 4 MG/2ML IJ SOLN: 4.0000 mg | Freq: Once | INTRAMUSCULAR | Status: DC | PRN

## 2020-02-24 MED ORDER — SORBITOL 70 % SOLN: 30.0000 mL | Freq: Every day | Status: DC | PRN

## 2020-02-24 MED ORDER — TRANEXAMIC ACID 1000 MG/10ML IV SOLN
2000.0000 mg | INTRAVENOUS | Status: AC
  Administered 2020-02-24: 2000 mg via TOPICAL
  Filled 2020-02-24: qty 20

## 2020-02-24 MED ORDER — MELATONIN 3 MG PO TABS
3.0000 mg | ORAL_TABLET | Freq: Every day | ORAL | Status: DC
  Administered 2020-02-24: 3 mg via ORAL
  Filled 2020-02-24: qty 1

## 2020-02-24 MED ORDER — FENTANYL CITRATE (PF) 100 MCG/2ML IJ SOLN: 25.0000 ug | INTRAMUSCULAR | Status: DC | PRN

## 2020-02-24 MED ORDER — POVIDONE-IODINE 10 % EX SWAB
2.0000 "application " | Freq: Once | CUTANEOUS | Status: AC
  Administered 2020-02-24: 2 via TOPICAL

## 2020-02-24 MED ORDER — MAGNESIUM CITRATE PO SOLN
1.0000 | Freq: Once | ORAL | Status: DC | PRN
  Filled 2020-02-24: qty 296

## 2020-02-24 MED ORDER — OXYCODONE HCL 5 MG PO TABS
5.0000 mg | ORAL_TABLET | ORAL | Status: DC | PRN
  Administered 2020-02-24 – 2020-02-25 (×2): 10 mg via ORAL
  Filled 2020-02-24 (×2): qty 2

## 2020-02-24 MED ORDER — PROPOFOL 500 MG/50ML IV EMUL
INTRAVENOUS | Status: AC
  Filled 2020-02-24: qty 50

## 2020-02-24 MED ORDER — OXYCODONE HCL ER 10 MG PO T12A
10.0000 mg | EXTENDED_RELEASE_TABLET | Freq: Two times a day (BID) | ORAL | Status: DC
  Administered 2020-02-24: 10 mg via ORAL
  Filled 2020-02-24: qty 1

## 2020-02-24 MED ORDER — MELATONIN 1 MG PO TABS
3.0000 mg | ORAL_TABLET | Freq: Every day | ORAL | Status: DC
  Filled 2020-02-24: qty 3

## 2020-02-24 MED ORDER — MENTHOL 3 MG MT LOZG: 1.0000 | LOZENGE | OROMUCOSAL | Status: DC | PRN

## 2020-02-24 MED ORDER — TRANEXAMIC ACID-NACL 1000-0.7 MG/100ML-% IV SOLN
1000.0000 mg | INTRAVENOUS | Status: AC
  Administered 2020-02-24: 1000 mg via INTRAVENOUS

## 2020-02-24 MED ORDER — LOSARTAN POTASSIUM 50 MG PO TABS
100.0000 mg | ORAL_TABLET | Freq: Every day | ORAL | Status: DC
  Filled 2020-02-24: qty 2

## 2020-02-24 MED ORDER — LACTATED RINGERS IV SOLN: INTRAVENOUS | Status: DC

## 2020-02-24 MED ORDER — HYDROMORPHONE HCL 1 MG/ML IJ SOLN
0.5000 mg | INTRAMUSCULAR | Status: DC | PRN
  Administered 2020-02-25: 0.5 mg via INTRAVENOUS
  Filled 2020-02-24: qty 0.5

## 2020-02-24 MED ORDER — SPIRONOLACTONE 25 MG PO TABS
25.0000 mg | ORAL_TABLET | Freq: Every day | ORAL | Status: DC
  Filled 2020-02-24: qty 1

## 2020-02-24 MED ORDER — ONDANSETRON HCL 4 MG/2ML IJ SOLN
INTRAMUSCULAR | Status: AC
  Filled 2020-02-24: qty 2

## 2020-02-24 MED ORDER — 0.9 % SODIUM CHLORIDE (POUR BTL) OPTIME
TOPICAL | Status: DC | PRN
  Administered 2020-02-24: 1000 mL

## 2020-02-24 MED ORDER — FENTANYL CITRATE (PF) 100 MCG/2ML IJ SOLN
INTRAMUSCULAR | Status: AC
  Filled 2020-02-24: qty 2

## 2020-02-24 MED ORDER — SODIUM CHLORIDE 0.9 % IV SOLN: INTRAVENOUS | Status: DC

## 2020-02-24 MED ORDER — BUPIVACAINE LIPOSOME 1.3 % IJ SUSP: 20.0000 mL | Freq: Once | INTRAMUSCULAR | Status: DC

## 2020-02-24 MED ORDER — PHENYLEPHRINE HCL (PRESSORS) 10 MG/ML IV SOLN
INTRAVENOUS | Status: AC
  Filled 2020-02-24: qty 2

## 2020-02-24 MED ORDER — VANCOMYCIN HCL 1000 MG IV SOLR
INTRAVENOUS | Status: AC
  Filled 2020-02-24: qty 1000

## 2020-02-24 MED ORDER — PROPOFOL 500 MG/50ML IV EMUL
INTRAVENOUS | Status: DC | PRN
  Administered 2020-02-24: 75 ug/kg/min via INTRAVENOUS

## 2020-02-24 MED ORDER — MIDAZOLAM HCL 2 MG/2ML IJ SOLN
INTRAMUSCULAR | Status: AC
  Filled 2020-02-24: qty 2

## 2020-02-24 MED ORDER — SODIUM CHLORIDE 0.9 % IR SOLN
Status: DC | PRN
  Administered 2020-02-24: 3000 mL

## 2020-02-24 MED ORDER — PHENOL 1.4 % MT LIQD: 1.0000 | OROMUCOSAL | Status: DC | PRN

## 2020-02-24 MED ORDER — EPHEDRINE 5 MG/ML INJ
INTRAVENOUS | Status: AC
  Filled 2020-02-24: qty 10

## 2020-02-24 MED ORDER — OXYCODONE HCL 5 MG PO TABS: 10.0000 mg | ORAL_TABLET | ORAL | Status: DC | PRN

## 2020-02-24 MED ORDER — FENTANYL CITRATE (PF) 100 MCG/2ML IJ SOLN
INTRAMUSCULAR | Status: DC | PRN
Start: 2020-02-24 — End: 2020-02-24
  Administered 2020-02-24 (×2): 25 ug via INTRAVENOUS

## 2020-02-24 MED ORDER — BUPIVACAINE LIPOSOME 1.3 % IJ SUSP
INTRAMUSCULAR | Status: AC
  Filled 2020-02-24: qty 20

## 2020-02-24 MED ORDER — CEFAZOLIN SODIUM-DEXTROSE 2-4 GM/100ML-% IV SOLN
2.0000 g | INTRAVENOUS | Status: AC
  Administered 2020-02-24: 2 g via INTRAVENOUS

## 2020-02-24 MED ORDER — IRRISEPT - 450ML BOTTLE WITH 0.05% CHG IN STERILE WATER, USP 99.95% OPTIME
TOPICAL | Status: DC | PRN
  Administered 2020-02-24: 450 mL

## 2020-02-24 MED ORDER — ROPIVACAINE HCL 7.5 MG/ML IJ SOLN
INTRAMUSCULAR | Status: DC | PRN
  Administered 2020-02-24: 20 mL via PERINEURAL

## 2020-02-24 MED ORDER — METOCLOPRAMIDE HCL 5 MG PO TABS: 5.0000 mg | ORAL_TABLET | Freq: Three times a day (TID) | ORAL | Status: DC | PRN

## 2020-02-24 MED ORDER — HYDRALAZINE HCL 50 MG PO TABS
100.0000 mg | ORAL_TABLET | Freq: Three times a day (TID) | ORAL | Status: DC
  Administered 2020-02-25: 100 mg via ORAL
  Filled 2020-02-24: qty 2

## 2020-02-24 MED ORDER — OXYCODONE HCL 5 MG PO TABS: 5.0000 mg | ORAL_TABLET | Freq: Once | ORAL | Status: DC | PRN

## 2020-02-24 MED ORDER — PROPOFOL 10 MG/ML IV BOLUS
INTRAVENOUS | Status: DC | PRN
  Administered 2020-02-24 (×3): 20 mg via INTRAVENOUS
  Administered 2020-02-24: 40 mg via INTRAVENOUS

## 2020-02-24 MED ORDER — TRANEXAMIC ACID 1000 MG/10ML IV SOLN
INTRAVENOUS | Status: DC | PRN
  Administered 2020-02-24: 2000 mg via TOPICAL

## 2020-02-24 MED ORDER — RIVAROXABAN 10 MG PO TABS
10.0000 mg | ORAL_TABLET | Freq: Every day | ORAL | Status: DC
  Administered 2020-02-25: 10 mg via ORAL
  Filled 2020-02-24: qty 1

## 2020-02-24 MED ORDER — EPHEDRINE SULFATE 50 MG/ML IJ SOLN
INTRAMUSCULAR | Status: DC | PRN
  Administered 2020-02-24: 10 mg via INTRAVENOUS

## 2020-02-24 MED ORDER — METHOCARBAMOL 500 MG PO TABS
500.0000 mg | ORAL_TABLET | Freq: Four times a day (QID) | ORAL | Status: DC | PRN
  Administered 2020-02-25: 500 mg via ORAL
  Filled 2020-02-24: qty 1

## 2020-02-24 MED ORDER — METOCLOPRAMIDE HCL 5 MG/ML IJ SOLN: 5.0000 mg | Freq: Three times a day (TID) | INTRAMUSCULAR | Status: DC | PRN

## 2020-02-24 MED ORDER — ACETAMINOPHEN 325 MG PO TABS
325.0000 mg | ORAL_TABLET | Freq: Four times a day (QID) | ORAL | Status: DC | PRN
  Filled 2020-02-24: qty 2

## 2020-02-24 MED ORDER — POLYETHYLENE GLYCOL 3350 17 G PO PACK: 17.0000 g | PACK | Freq: Every day | ORAL | Status: DC | PRN

## 2020-02-24 MED ORDER — TRANEXAMIC ACID-NACL 1000-0.7 MG/100ML-% IV SOLN
INTRAVENOUS | Status: AC
  Filled 2020-02-24: qty 100

## 2020-02-24 SURGICAL SUPPLY — 90 items
ADH SKN CLS APL DERMABOND .7 (GAUZE/BANDAGES/DRESSINGS)
ALCOHOL 70% 16 OZ (MISCELLANEOUS) ×3 IMPLANT
ANCH SUT 2 2.9 2 LD TPR NDL (Anchor) ×1 IMPLANT
ANCHOR JUGGERKNOT WTAP NDL 2.9 (Anchor) ×3 IMPLANT
BAG DECANTER FOR FLEXI CONT (MISCELLANEOUS) ×3 IMPLANT
BIT DRILL JUGRKNT W/NDL BIT2.9 (DRILL) ×1 IMPLANT
BLADE HEX COATED 2.75 (ELECTRODE) ×3 IMPLANT
BLADE SAG 18X100X1.27 (BLADE) ×3 IMPLANT
BOWL SMART MIX CTS (DISPOSABLE) ×3 IMPLANT
BSPLAT TIB 5D F CMNT STM RT (Knees) ×1 IMPLANT
CEMENT BONE REFOBACIN R1X40 US (Cement) ×6 IMPLANT
CLOSURE STERI-STRIP 1/2X4 (GAUZE/BANDAGES/DRESSINGS) ×1
CLSR STERI-STRIP ANTIMIC 1/2X4 (GAUZE/BANDAGES/DRESSINGS) ×2 IMPLANT
COOLER ICEMAN CLASSIC (MISCELLANEOUS) ×3 IMPLANT
COVER WAND RF STERILE (DRAPES) ×3 IMPLANT
CUFF TOURN SGL QUICK 34 (TOURNIQUET CUFF) ×3
CUFF TRNQT CYL 34X4.125X (TOURNIQUET CUFF) ×1 IMPLANT
DERMABOND ADVANCED (GAUZE/BANDAGES/DRESSINGS)
DERMABOND ADVANCED .7 DNX12 (GAUZE/BANDAGES/DRESSINGS) IMPLANT
DRAPE EXTREMITY T 121X128X90 (DISPOSABLE) ×3 IMPLANT
DRAPE INCISE IOBAN 66X45 STRL (DRAPES) ×3 IMPLANT
DRAPE POUCH INSTRU U-SHP 10X18 (DRAPES) ×3 IMPLANT
DRAPE TOP ARMCOVERS (MISCELLANEOUS) ×3 IMPLANT
DRAPE U-SHAPE 47X51 STRL (DRAPES) ×3 IMPLANT
DRAPE U-SHAPE 76X120 STRL (DRAPES) ×6 IMPLANT
DRILL JUGGERKNOT W/NDL BIT 2.9 (DRILL) ×3
DRSG AQUACEL AG ADV 3.5X 6 (GAUZE/BANDAGES/DRESSINGS) IMPLANT
DRSG AQUACEL AG ADV 3.5X10 (GAUZE/BANDAGES/DRESSINGS) IMPLANT
DRSG PAD ABDOMINAL 8X10 ST (GAUZE/BANDAGES/DRESSINGS) IMPLANT
DURAPREP 26ML APPLICATOR (WOUND CARE) ×9 IMPLANT
ELECT REM PT RETURN 9FT ADLT (ELECTROSURGICAL) ×3
ELECTRODE REM PT RTRN 9FT ADLT (ELECTROSURGICAL) ×1 IMPLANT
FEMUR  CMT CCR STD SZ8 R KNEE (Knees) ×2 IMPLANT
FEMUR CMT CCR STD SZ8 R KNEE (Knees) ×1 IMPLANT
FEMUR CMTD CCR STD SZ8 R KNEE (Knees) ×1 IMPLANT
GAUZE SPONGE 4X4 12PLY STRL (GAUZE/BANDAGES/DRESSINGS) ×3 IMPLANT
GLOVE BIOGEL PI IND STRL 7.0 (GLOVE) ×1 IMPLANT
GLOVE BIOGEL PI INDICATOR 7.0 (GLOVE) ×2
GLOVE ECLIPSE 7.0 STRL STRAW (GLOVE) ×9 IMPLANT
GLOVE SKINSENSE NS SZ7.5 (GLOVE) ×6
GLOVE SKINSENSE STRL SZ7.5 (GLOVE) ×3 IMPLANT
GLOVE SURG SS PI 7.5 STRL IVOR (GLOVE) ×3 IMPLANT
GLOVE SURG SYN 7.5  E (GLOVE) ×8
GLOVE SURG SYN 7.5 E (GLOVE) ×4 IMPLANT
GOWN STRL REIN XL XLG (GOWN DISPOSABLE) ×3 IMPLANT
GOWN STRL REUS W/ TWL LRG LVL3 (GOWN DISPOSABLE) ×1 IMPLANT
GOWN STRL REUS W/ TWL XL LVL3 (GOWN DISPOSABLE) ×1 IMPLANT
GOWN STRL REUS W/TWL LRG LVL3 (GOWN DISPOSABLE) ×3
GOWN STRL REUS W/TWL XL LVL3 (GOWN DISPOSABLE) ×3
HANDPIECE INTERPULSE COAX TIP (DISPOSABLE) ×3
HDLS TROCR DRIL PIN KNEE 75 (PIN) ×2
HOOD PEEL AWAY FLYTE STAYCOOL (MISCELLANEOUS) ×6 IMPLANT
INSERT TIB ASF PERSONA 14 (Orthopedic Implant) ×3 IMPLANT
JET LAVAGE IRRISEPT WOUND (IRRIGATION / IRRIGATOR) ×3
KIT TURNOVER KIT B (KITS) ×3 IMPLANT
LAVAGE JET IRRISEPT WOUND (IRRIGATION / IRRIGATOR) ×1 IMPLANT
MANIFOLD NEPTUNE II (INSTRUMENTS) ×3 IMPLANT
MARKER SKIN DUAL TIP RULER LAB (MISCELLANEOUS) ×3 IMPLANT
NEEDLE SPNL 18GX3.5 QUINCKE PK (NEEDLE) ×6 IMPLANT
NS IRRIG 1000ML POUR BTL (IV SOLUTION) ×3 IMPLANT
PACK BASIN DAY SURGERY FS (CUSTOM PROCEDURE TRAY) ×3 IMPLANT
PACK ICE MAXI GEL EZY WRAP (MISCELLANEOUS) ×3 IMPLANT
PACK TOTAL JOINT (CUSTOM PROCEDURE TRAY) ×3 IMPLANT
PAD CAST 4YDX4 CTTN HI CHSV (CAST SUPPLIES) ×1 IMPLANT
PAD COLD SHLDR WRAP-ON (PAD) ×3 IMPLANT
PADDING CAST COTTON 4X4 STRL (CAST SUPPLIES) ×3
PIN DRILL HDLS TROCAR 75 4PK (PIN) ×1 IMPLANT
SAW OSC TIP CART 19.5X105X1.3 (SAW) ×6 IMPLANT
SET HNDPC FAN SPRY TIP SCT (DISPOSABLE) ×1 IMPLANT
SHEET MEDIUM DRAPE 40X70 STRL (DRAPES) ×3 IMPLANT
SLEEVE SCD COMPRESS KNEE MED (MISCELLANEOUS) ×3 IMPLANT
STAPLER VISISTAT 35W (STAPLE) IMPLANT
STEM POLY PAT PLY 38M KNEE (Knees) ×3 IMPLANT
STEM TIB ST PERS 14+30 (Stem) ×3 IMPLANT
STEM TIBIA 5 DEG SZ F R KNEE (Knees) ×1 IMPLANT
SUCTION FRAZIER HANDLE 10FR (MISCELLANEOUS) ×2
SUCTION TUBE FRAZIER 10FR DISP (MISCELLANEOUS) ×1 IMPLANT
SUT ETHILON 2 0 FS 18 (SUTURE) IMPLANT
SUT MNCRL AB 3-0 PS2 27 (SUTURE) IMPLANT
SUT VIC AB 0 CT1 27 (SUTURE) ×6
SUT VIC AB 0 CT1 27XBRD ANBCTR (SUTURE) ×2 IMPLANT
SUT VIC AB 1 CTX 27 (SUTURE) ×9 IMPLANT
SUT VIC AB 2-0 CT1 27 (SUTURE) ×6
SUT VIC AB 2-0 CT1 TAPERPNT 27 (SUTURE) ×2 IMPLANT
SYR 50ML LL SCALE MARK (SYRINGE) ×6 IMPLANT
TIBIA STEM 5 DEG SZ F R KNEE (Knees) ×3 IMPLANT
TOWEL GREEN STERILE FF (TOWEL DISPOSABLE) ×12 IMPLANT
TRAY FOLEY W/BAG SLVR 14FR LF (SET/KITS/TRAYS/PACK) ×3 IMPLANT
TRAY SPINAL 24X4 BUPIV (SET/KITS/TRAYS/PACK) ×3 IMPLANT
YANKAUER SUCT BULB TIP NO VENT (SUCTIONS) ×3 IMPLANT

## 2020-02-24 NOTE — Progress Notes (Signed)
[facility] office RNCM attempted to assist with DME and post-op services for patient after his elective Right total knee arthroplasty with Dr. Roda Shutters today, 02/24/20. Contacted patient yesterday and reviewed all information related to post-op care via interpretation. Patient will be getting his home CPM through [facility]. They will be contacting him either at [facility] before discharge or at his home to deliver. No other DME needed. HH services could not be found for patient due to being [facility] financial aid services. [facility] is [facility] care on service this week. Made referral and they informed they could not staff [facility] services at this time in his area. Referral made to OPPT to begin sooner. He was active with [facility] in [place] previously and is familiar with this location. He has an appointment scheduled for Wednesday, 03/02/20 at 4:00 pm to begin OP services. Please call Ralph Dowdy, RN Case Manager for questions. [phone].

## 2020-02-24 NOTE — H&P (Signed)
PREOPERATIVE H&P  Chief Complaint: right knee degenerative joint disease  HPI: Juan Hughes is a 64 y.o. male who presents for surgical treatment of right knee degenerative joint disease.  He denies any changes in medical history.  Past Medical History:  Diagnosis Date  . CHF (congestive heart failure) (HCC)    last ef 40-45% echo 05/2019  . GERD (gastroesophageal reflux disease)   . HFrEF (heart failure with reduced ejection fraction) (HCC)   . Hypertension   . Osteoarthritis of right knee   . Stroke [facility]) 06/2019   left sided weakness   Past Surgical History:  Procedure Laterality Date  . INNER EAR SURGERY     as teenager   Social History   Socioeconomic History  . Marital status: Married    Spouse name: Not on file  . Number of children: Not on file  . Years of education: Not on file  . Highest education level: Not on file  Occupational History  . Not on file  Tobacco Use  . Smoking status: Former Smoker    Types: Cigars  . Smokeless tobacco: Never Used  Vaping Use  . Vaping Use: Never used  Substance and Sexual Activity  . Alcohol use: Never  . Drug use: Never  . Sexual activity: Not on file  Other Topics Concern  . Not on file  Social History Narrative  . Not on file   Social Determinants of Health   Financial Resource Strain:   . Difficulty of Paying Living Expenses: Not on file  Food Insecurity:   . Worried About Programme researcher, broadcasting/film/video in the Last Year: Not on file  . Ran Out of Food in the Last Year: Not on file  Transportation Needs:   . Lack of Transportation (Medical): Not on file  . Lack of Transportation (Non-Medical): Not on file  Physical Activity:   . Days of Exercise per Week: Not on file  . Minutes of Exercise per Session: Not on file  Stress:   . Feeling of Stress : Not on file  Social Connections:   . Frequency of Communication with Friends and Family: Not on file  . Frequency of Social Gatherings with Friends and  Family: Not on file  . Attends Religious Services: Not on file  . Active Member of Clubs or Organizations: Not on file  . Attends Banker Meetings: Not on file  . Marital Status: Not on file   Family History  Problem Relation Age of Onset  . Hypertension Mother    No Known Allergies Prior to Admission medications   Medication Sig Start Date End Date Taking? Authorizing Provider  acetaminophen (TYLENOL) 650 MG CR tablet Take 650 mg by mouth every 8 (eight) hours as needed for pain.   Yes [provider]  aspirin EC 325 MG EC tablet Take 1 tablet (325 mg total) by mouth daily. 06/24/19  Yes Darlin Priestly, MD  atorvastatin (LIPITOR) 80 MG tablet Take 1 tablet (80 mg total) by mouth daily at 6 PM. 06/24/19 02/19/20 Yes Visser, Jacquelyn D, PA-C  carvedilol (COREG) 25 MG tablet Take 1 tablet (25 mg total) by mouth 2 (two) times daily. 10/05/19 02/19/20 Yes Agbor-Etang, Arlys John, MD  diclofenac Sodium (VOLTAREN) 1 % GEL Apply topically as directed.   Yes [provider]  hydrALAZINE (APRESOLINE) 100 MG tablet Take 1 tablet (100 mg total) by mouth every 8 (eight) hours. 06/23/19 02/19/20 Yes Darlin Priestly, MD  losartan (COZAAR) 100 MG  tablet Take 1 tablet (100 mg total) by mouth daily. 06/24/19 02/19/20 Yes Darlin Priestly, MD  Melatonin 1 MG CAPS Take 3 mg by mouth at bedtime.    Yes [provider]  spironolactone (ALDACTONE) 25 MG tablet Take 1 tablet (25 mg total) by mouth daily. 02/08/20 05/08/20 Yes Agbor-Etang, Arlys John, MD  docusate sodium (COLACE) 100 MG capsule Take 1 capsule (100 mg total) by mouth daily as needed. 02/22/20 02/21/21  Cristie Hem, PA-C  methocarbamol (ROBAXIN) 750 MG tablet Take 1 tablet (750 mg total) by mouth 2 (two) times daily as needed for muscle spasms. 02/22/20   Cristie Hem, PA-C  ondansetron (ZOFRAN) 4 MG tablet Take 1 tablet (4 mg total) by mouth every 8 (eight) hours as needed for nausea or vomiting. 02/22/20   Cristie Hem, PA-C  rivaroxaban  (XARELTO) 10 MG TABS tablet Take 1 tablet (10 mg total) by mouth daily. 02/22/20 03/28/20  Cristie Hem, PA-C     Positive ROS: All other systems have been reviewed and were otherwise negative with the exception of those mentioned in the HPI and as above.  Physical Exam: General: Alert, no acute distress Cardiovascular: No pedal edema Respiratory: No cyanosis, no use of accessory musculature GI: abdomen soft Skin: No lesions in the area of chief complaint Neurologic: Sensation intact distally Psychiatric: Patient is competent for consent with normal mood and affect Lymphatic: no lymphedema  MUSCULOSKELETAL: exam stable  Assessment: right knee degenerative joint disease  Plan: Plan for Procedure(s): RIGHT TOTAL KNEE ARTHROPLASTY  The risks benefits and alternatives were discussed with the patient including but not limited to the risks of nonoperative treatment, versus surgical intervention including infection, bleeding, nerve injury,  blood clots, cardiopulmonary complications, morbidity, mortality, among others, and they were willing to proceed.   Preoperative templating of the joint replacement has been completed, documented, and submitted to the Operating Room personnel in order to optimize intra-operative equipment management.   Glee Arvin, MD 02/24/2020 7:20 AM

## 2020-02-24 NOTE — Anesthesia Procedure Notes (Signed)
Anesthesia Regional Block: Adductor canal block   Pre-Anesthetic Checklist: ,, timeout performed, Correct Patient, Correct Site, Correct Laterality, Correct Procedure, Correct Position, site marked, Risks and benefits discussed,  Surgical consent,  Pre-op evaluation,  At surgeon's request and post-op pain management  Laterality: Right  Prep: chloraprep       Needles:  Injection technique: Single-shot  Needle Type: Echogenic Needle     Needle Length: 10cm  Needle Gauge: 21     Additional Needles:   Narrative:  Start time: 02/24/2020 12:42 PM End time: 02/24/2020 12:45 PM Injection made incrementally with aspirations every 5 mL.  Performed by: Personally  Anesthesiologist: Beryle Lathe, MD  Additional Notes: No pain on injection. No increased resistance to injection. Injection made in 5cc increments. Good needle visualization. Patient tolerated the procedure well.

## 2020-02-24 NOTE — Transfer of Care (Signed)
Immediate Anesthesia Transfer of Care Note  Patient: Juan Hughes  Procedure(s) Performed: RIGHT TOTAL KNEE ARTHROPLASTY (Right Knee)  Patient Location: PACU  Anesthesia Type:General and Regional  Level of Consciousness: drowsy, patient cooperative and responds to stimulation  Airway & Oxygen Therapy: Patient Spontanous Breathing and Patient connected to face mask oxygen  Post-op Assessment: Report given to RN and Post -op Vital signs reviewed and stable  Post vital signs: Reviewed and stable  Last Vitals:  Vitals Value Taken Time  BP    Temp    Pulse    Resp    SpO2      Last Pain:  Vitals:   02/24/20 1135  TempSrc: Oral  PainSc: 0-No pain      Patients Stated Pain Goal: 5 (02/24/20 1135)  Complications: No complications documented.

## 2020-02-24 NOTE — Op Note (Signed)
Total Knee Arthroplasty Procedure Note  Preoperative diagnosis: Severe degenerative joint disease with varus deformity of right knee  Postoperative diagnosis:same  Operative procedure: Right total knee arthroplasty. CPT [phone]  Surgeon: N. Glee Arvin, MD  Assist: Oneal Grout, PA-C; necessary for the timely completion of procedure and due to complexity of procedure.  Anesthesia: Spinal, regional, local  Tourniquet time: see anesthesia record  Implants used: Zimmer persona Femur: CPS 8 Tibia: F Patella: 38 mm Polyethylene: 14 mm, constrained PS  Indication: Juan Hughes is a 64 y.o. year old male with a history of knee pain. Having failed conservative management, the patient elected to proceed with a total knee arthroplasty.  We have reviewed the risk and benefits of the surgery and they elected to proceed after voicing understanding.  Procedure:  After informed consent was obtained and understanding of the risk were voiced including but not limited to bleeding, infection, damage to surrounding structures including nerves and vessels, blood clots, leg length inequality and the failure to achieve desired results, the operative extremity was marked with verbal confirmation of the patient in the holding area.   The patient was then brought to the operating room and transported to the operating room table in the supine position.  A tourniquet was applied to the operative extremity around the upper thigh. The operative limb was then prepped and draped in the usual sterile fashion and preoperative antibiotics were administered.  A time out was performed prior to the start of surgery confirming the correct extremity, preoperative antibiotic administration, as well as team members, implants and instruments available for the case. Correct surgical site was also confirmed with preoperative radiographs. The limb was then elevated for exsanguination and the tourniquet was  inflated. A midline incision was made and a standard medial parapatellar approach was performed.  The infrapatellar fat pad was removed.  Suprapatellar synovium was removed to reveal the anterior distal femoral cortex.  A medial peel was performed to release the capsule of the medial tibial plateau.  The patella was then everted and was prepared and sized to a 38 mm.  A cover was placed on the patella for protection from retractors.  The knee was then brought into full flexion and we then turned our attention to the femur.  The cruciates were sacrificed.  Start site was drilled in the femur and the intramedullary distal femoral cutting guide was placed, set at 3 degrees valgus, taking 12 mm of distal resection. The distal cut was made. Osteophytes were then removed.  The medial tibial plateau had severe wear.  Next, the proximal tibial cutting guide was placed with appropriate slope, varus/valgus alignment and depth of resection. The proximal tibial cut was made. Gap blocks were then used to assess the extension gap and alignment, and appropriate soft tissue releases were performed. Attention was turned back to the femur, which was sized using the sizing guide to a size 8. Appropriate rotation of the femoral component was determined using epicondylar axis, Whiteside's line, and assessing the flexion gap under ligament tension. The appropriate size 4-in-1 cutting block was placed and checked with an angel wing and cuts were made. Posterior femoral osteophytes and uncapped bone were then removed with the curved osteotome.  The PS box was cut with an oscillating saw.  Trial components were placed, and stability was checked in full extension, mid-flexion, and deep flexion. Proper tibial rotation was determined and marked.  The patella tracked well without a lateral release.  The femoral lugs  were then drilled.  Trial components were then removed and tibial preparation performed.  The tibia was sized for a size F  component.  A posterior capsular injection comprising of 20 cc of 1.3% exparel, 20 cc of 0.25% bupivicaine with epi and 20 cc of normal saline was performed for postoperative pain control. The bony surfaces were irrigated with a pulse lavage and then dried. Bone cement was vacuum mixed on the back table, and the final components sized above were cemented into place.  Antibiotic irrigation was placed in the knee joint and soft tissues while the cement cured.  After cement had finished curing, excess cement was removed. The stability of the construct was re-evaluated throughout a range of motion and found to be acceptable. The trial liner was removed, the knee was copiously irrigated, and the knee was re-evaluated for any excess bone debris. The real polyethylene liner, 14 mm thick, was inserted and checked to ensure the locking mechanism had engaged appropriately. The tourniquet was deflated and hemostasis was achieved. The wound was irrigated with normal saline.  The patellar tendon attachment was reinforced to the tibial tubercle with a 2.9 mm Juggerknot double loaded anchor.  The anchor was placed in the tibial tubercle and the bony surface was rasped.  The patellar tendon attachment was reinforced back to the tubercle using the provided sutures.  One gram of vancomycin powder was placed in the surgical bed.  Capsular closure was performed with a #1 vicryl, subcutaneous fat closed with a 0 vicryl suture, then subcutaneous tissue closed with interrupted 2.0 vicryl suture. The skin was then closed with a 2.0 nylon and dermabond. A sterile dressing was applied.  The patient was awakened in the operating room and taken to recovery in stable condition. All sponge, needle, and instrument counts were correct at the end of the case.  Position: supine  Complications: none.  Time Out: performed   Drains/Packing: none  Estimated blood loss: minimal  Returned to Recovery Room: in good condition.   Antibiotics:  yes   Mechanical VTE (DVT) Prophylaxis: sequential compression devices, TED thigh-high  Chemical VTE (DVT) Prophylaxis: xarelto  Fluid Replacement  Crystalloid: see anesthesia record Blood: none  FFP: none   Specimens Removed: 1 to pathology   Sponge and Instrument Count Correct? yes   PACU: portable radiograph - knee AP and Lateral   Plan/RTC: Return in 2 weeks for wound check.   Weight Bearing/Load Lower Extremity: full   N. Glee Arvin, MD [facility] 2:54 PM

## 2020-02-24 NOTE — Anesthesia Postprocedure Evaluation (Signed)
Anesthesia Post Note  Patient: Juan Hughes  Procedure(s) Performed: RIGHT TOTAL KNEE ARTHROPLASTY (Right Knee)     Patient location during evaluation: PACU Anesthesia Type: Spinal and General Level of consciousness: awake and alert Pain management: pain level controlled Vital Signs Assessment: post-procedure vital signs reviewed and stable Respiratory status: spontaneous breathing, respiratory function stable and nonlabored ventilation Cardiovascular status: blood pressure returned to baseline and stable Postop Assessment: spinal receding and no apparent nausea or vomiting Anesthetic complications: no   No complications documented.  Last Vitals:  Vitals:   02/24/20 1615 02/24/20 1630  BP: 125/72 131/72  Pulse: (!) 57 (!) 55  Resp: 18 13  Temp:    SpO2: 100% 99%    Last Pain:  Vitals:   02/24/20 1600  TempSrc:   PainSc: 0-No pain                 Beryle Lathe

## 2020-02-24 NOTE — Anesthesia Preprocedure Evaluation (Addendum)
Anesthesia Evaluation  Patient identified by MRN, date of birth, ID band Patient awake    Reviewed: Allergy & Precautions, NPO status , Patient's Chart, lab work & pertinent test results, reviewed documented beta blocker date and time   History of Anesthesia Complications Negative for: history of anesthetic complications  Airway Mallampati: II  TM Distance: >3 FB Neck ROM: Full    Dental  (+) Dental Advisory Given   Pulmonary former smoker,    Pulmonary exam normal        Cardiovascular hypertension, Pt. on home beta blockers and Pt. on medications +CHF  Normal cardiovascular exam     Neuro/Psych CVA (left sided weakness, mostly improved), Residual Symptoms negative psych ROS   GI/Hepatic Neg liver ROS, GERD  Controlled and Medicated,  Endo/Other  negative endocrine ROS  Renal/GU Renal InsufficiencyRenal disease     Musculoskeletal  (+) Arthritis ,   Abdominal   Peds  Hematology  INR 1.0 Plt 244k On ASA 325mg , last dose Monday    Anesthesia Other Findings Covid test negative   Reproductive/Obstetrics                            Anesthesia Physical Anesthesia Plan  ASA: III  Anesthesia Plan: Spinal   Post-op Pain Management:  Regional for Post-op pain   Induction:   PONV Risk Score and Plan: 1 and Treatment may vary due to age or medical condition and Propofol infusion  Airway Management Planned: Natural Airway and Simple Face Mask  Additional Equipment: None  Intra-op Plan:   Post-operative Plan:   Informed Consent: I have reviewed the patients History and Physical, chart, labs and discussed the procedure including the risks, benefits and alternatives for the proposed anesthesia with the patient or authorized representative who has indicated his/her understanding and acceptance.     Interpreter used for Tuesday Discussed with: CRNA and  Anesthesiologist  Anesthesia Plan Comments: (Labs reviewed, platelets acceptable. Discussed risks and benefits of spinal, including spinal/epidural hematoma, infection, failed block, and PDPH. Patient expressed understanding and wished to proceed. )      Anesthesia Quick Evaluation

## 2020-02-24 NOTE — Evaluation (Signed)
Physical Therapy Evaluation Patient Details Name: Juan Hughes MRN: [id number] DOB: 1956-04-05 Today's Date: 02/24/2020   History of Present Illness  Pt is a 64 y/o male s/p R TKA. PMH includes CVA with L sided deficits, HTN and CHF.   Clinical Impression  Pt admitted secondary to problem above with deficits below. Pt requiring min A for bed mobility and mod A to stand this session. Pt with notable weakness secondary to block effects, so further mobility unsafe to attempt. Wife reports she is available to assist as needed at d/c. Will need BSC prior to d/c. Will continue to follow acutely to maximize functional mobility independence and safety.     Follow Up Recommendations Follow surgeon's recommendation for DC plan and follow-up therapies    Equipment Recommendations  3in1 (PT)    Recommendations for Other Services       Precautions / Restrictions Precautions Precautions: Fall;Knee Precaution Booklet Issued: Yes (comment) Precaution Comments: Reviewed knee precautions with family. Spanish TKR HEP provided to pt.  Restrictions Weight Bearing Restrictions: Yes RLE Weight Bearing: Weight bearing as tolerated      Mobility  Bed Mobility Overal bed mobility: Needs Assistance Bed Mobility: Supine to Sit;Sit to Supine     Supine to sit: Min assist Sit to supine: Min assist   General bed mobility comments: Min A for RLE assist. Increased time to come to sitting.   Transfers Overall transfer level: Needs assistance Equipment used: Rolling walker (2 wheeled) Transfers: Sit to/from Stand Sit to Stand: Mod assist         General transfer comment: Mod A for lift assist and steadying. Noted difficulty powering up secondary to block effects. Heavy reliance on UEs. Unsafe to attempt further mobility.   Ambulation/Gait                Stairs            Wheelchair Mobility    Modified Rankin (Stroke Patients Only)       Balance Overall balance  assessment: Needs assistance Sitting-balance support: No upper extremity supported Sitting balance-Leahy Scale: Good     Standing balance support: Bilateral upper extremity supported Standing balance-Leahy Scale: Poor Standing balance comment: Reliant on BUE support                              Pertinent Vitals/Pain Pain Assessment: 0-10 Pain Score: 6  Pain Location: R knee  Pain Descriptors / Indicators: Aching;Operative site guarding Pain Intervention(s): Limited activity within patient's tolerance;Monitored during session;Repositioned    Home Living Family/patient expects to be discharged to:: Private residence Living Arrangements: Spouse/significant other Available Help at Discharge: Family Type of Home: Mobile home Home Access: Stairs to enter Entrance Stairs-Rails: Can reach both Entrance Stairs-Number of Steps: 5 Home Layout: One level Home Equipment: Emergency planning/management officer - 2 wheels      Prior Function Level of Independence: Independent with assistive device(s)         Comments: Uses RW since CVA     Hand Dominance   Dominant Hand: Right    Extremity/Trunk Assessment   Upper Extremity Assessment Upper Extremity Assessment: LUE deficits/detail LUE Deficits / Details: LUE deficits from previous CVA.     Lower Extremity Assessment Lower Extremity Assessment: LLE deficits/detail;RLE deficits/detail RLE Deficits / Details: Deficits consistent with post op pain and weakness. Functional weakness noted secondary to block effects.  LLE Deficits / Details: L sided deficits from previous stroke  Cervical / Trunk Assessment Cervical / Trunk Assessment: Normal  Communication   Communication: Prefers language other than [place];Other (comment) (spanish; wife available to interpret)  Cognition Arousal/Alertness: Awake/alert Behavior During Therapy: WFL for tasks assessed/performed Overall Cognitive Status: Within Functional Limits for tasks assessed                                         General Comments      Exercises General Exercises - Lower Extremity Ankle Circles/Pumps: AROM;Both;10 reps;Supine   Assessment/Plan    PT Assessment Patient needs continued PT services  PT Problem List Decreased strength;Decreased balance;Decreased mobility;Decreased knowledge of use of DME;Decreased coordination;Decreased activity tolerance;Decreased knowledge of precautions;Impaired sensation       PT Treatment Interventions Stair training;Gait training;DME instruction;Functional mobility training;Therapeutic activities;Therapeutic exercise;Balance training;Patient/family education    PT Goals (Current goals can be found in the Care Plan section)  Acute Rehab PT Goals Patient Stated Goal: to decrease pain  PT Goal Formulation: With patient Time For Goal Achievement: 03/09/20 Potential to Achieve Goals: Good    Frequency 7X/week   Barriers to discharge        Co-evaluation               AM-PAC PT "6 Clicks" Mobility  Outcome Measure Help needed turning from your back to your side while in a flat bed without using bedrails?: None Help needed moving from lying on your back to sitting on the side of a flat bed without using bedrails?: A Little Help needed moving to and from a bed to a chair (including a wheelchair)?: A Lot Help needed standing up from a chair using your arms (e.g., wheelchair or bedside chair)?: A Lot Help needed to walk in hospital room?: Total Help needed climbing 3-5 steps with a railing? : Total 6 Click Score: 13    End of Session Equipment Utilized During Treatment: Gait belt Activity Tolerance: Patient tolerated treatment well Patient left: in bed;with call bell/phone within reach;with family/visitor present Nurse Communication: Mobility status;Patient requests pain meds PT Visit Diagnosis: Unsteadiness on feet (R26.81);Muscle weakness (generalized) (M62.81)    Time: 6761-9509 PT Time  Calculation (min) (ACUTE ONLY): 17 min   Charges:   PT Evaluation $PT Eval Low Complexity: 1 Low          Cindee Salt, DPT  Acute Rehabilitation Services  Pager: [phone] Office: [phone]   Lehman Prom 02/24/2020, 6:07 PM

## 2020-02-24 NOTE — Anesthesia Procedure Notes (Signed)
Spinal  Patient location during procedure: OR Start time: 02/24/2020 12:59 PM End time: 02/24/2020 1:04 PM Staffing Performed: anesthesiologist  Anesthesiologist: Beryle Lathe, MD Preanesthetic Checklist Completed: patient identified, IV checked, risks and benefits discussed, surgical consent, monitors and equipment checked, pre-op evaluation and timeout performed Spinal Block Patient position: sitting Prep: DuraPrep Patient monitoring: heart rate, cardiac monitor, continuous pulse ox and blood pressure Approach: midline Location: L3-4 Injection technique: single-shot Needle Needle type: Quincke  Needle gauge: 22 G Additional Notes Consent was obtained prior to the procedure with all questions answered and concerns addressed. Risks including, but not limited to, bleeding, infection, nerve damage, paralysis, failed block, inadequate analgesia, allergic reaction, high spinal, itching, and headache were discussed and the patient wished to proceed. Functioning IV was confirmed and monitors were applied. Sterile prep and drape, including hand hygiene, mask, and sterile gloves were used. The patient was positioned and the spine was prepped. The skin was anesthetized with lidocaine. Free flow of clear CSF was obtained prior to injecting local anesthetic into the CSF. The spinal needle aspirated freely following injection. The needle was carefully withdrawn. The patient tolerated the procedure well.   Leslye Peer, MD

## 2020-02-24 NOTE — Anesthesia Procedure Notes (Signed)
Procedure Name: LMA Insertion Date/Time: 02/24/2020 1:28 PM Performed by: Thornell Mule, CRNA Pre-anesthesia Checklist: Patient identified, Emergency Drugs available, Suction available and Patient being monitored Patient Re-evaluated:Patient Re-evaluated prior to induction Oxygen Delivery Method: Circle system utilized Preoxygenation: Pre-oxygenation with 100% oxygen Induction Type: IV induction Ventilation: Mask ventilation without difficulty LMA: LMA inserted LMA Size: 4.0 Number of attempts: 1 Placement Confirmation: positive ETCO2 Tube secured with: Tape Dental Injury: Teeth and Oropharynx as per pre-operative assessment

## 2020-02-24 NOTE — Telephone Encounter (Signed)
Patient aware. He understands.

## 2020-02-25 ENCOUNTER — Telehealth: Payer: Self-pay | Admitting: Orthopaedic Surgery

## 2020-02-25 ENCOUNTER — Other Ambulatory Visit: Payer: Self-pay | Admitting: Physician Assistant

## 2020-02-25 LAB — CBC
HCT: 37.1 % — ABNORMAL LOW (ref 39.0–52.0)
Hemoglobin: 11.4 g/dL — ABNORMAL LOW (ref 13.0–17.0)
MCH: 28.9 pg (ref 26.0–34.0)
MCHC: 30.7 g/dL (ref 30.0–36.0)
MCV: 94.2 fL (ref 80.0–100.0)
Platelets: 204 10*3/uL (ref 150–400)
RBC: 3.94 MIL/uL — ABNORMAL LOW (ref 4.22–5.81)
RDW: 13 % (ref 11.5–15.5)
WBC: 5.3 10*3/uL (ref 4.0–10.5)
nRBC: 0 % (ref 0.0–0.2)

## 2020-02-25 LAB — BASIC METABOLIC PANEL
Anion gap: 8 (ref 5–15)
BUN: 21 mg/dL (ref 8–23)
CO2: 20 mmol/L — ABNORMAL LOW (ref 22–32)
Calcium: 8.4 mg/dL — ABNORMAL LOW (ref 8.9–10.3)
Chloride: 112 mmol/L — ABNORMAL HIGH (ref 98–111)
Creatinine, Ser: 1.58 mg/dL — ABNORMAL HIGH (ref 0.61–1.24)
GFR calc non Af Amer: 46 mL/min — ABNORMAL LOW (ref >60)
Glucose, Bld: 98 mg/dL (ref 70–99)
Potassium: 4.1 mmol/L (ref 3.5–5.1)
Sodium: 140 mmol/L (ref 135–145)

## 2020-02-25 MED ORDER — OXYCODONE-ACETAMINOPHEN 5-325 MG PO TABS: 1.0000 | ORAL_TABLET | Freq: Four times a day (QID) | ORAL | 0 refills | Status: DC | PRN

## 2020-02-25 NOTE — Progress Notes (Signed)
Physical Therapy Treatment Patient Details Name: Juan Hughes MRN: [id number] DOB: 1955/07/13 Today's Date: 02/25/2020    History of Present Illness Pt is a 64 y/o male s/Hughes R TKA. PMH includes CVA with L sided deficits, HTN and CHF.     PT Comments    Pt pleasant and able to progress to ambulation today. Pt with pain 3-6/10 today with ROM limited to grossly 35 degrees. Pt educated for bed mobility, transfers, gait, stairs, HEP and precautions. Entire session with wife present and interpreting for pt. Pt appropriate for D/C with continued HHPT as set up by physician.      Follow Up Recommendations  Follow surgeon's recommendation for DC plan and follow-up therapies     Equipment Recommendations  3in1 (PT)    Recommendations for Other Services       Precautions / Restrictions Precautions Precautions: Fall;Knee Precaution Comments: Reviewed knee precautions with family Restrictions RLE Weight Bearing: Weight bearing as tolerated    Mobility  Bed Mobility Overal bed mobility: Needs Assistance Bed Mobility: Supine to Sit;Sit to Supine     Supine to sit: Supervision Sit to supine: Supervision   General bed mobility comments: cues for sequence with increased time and effort.cue to hook RLE with LLE to manage limb over and back onto bed  Transfers Overall transfer level: Needs assistance   Transfers: Sit to/from Stand Sit to Stand: Min assist         General transfer comment: cues for hand placement, assist to rise  Ambulation/Gait Ambulation/Gait assistance: Min guard Gait Distance (Feet): 85 Feet Assistive device: Rolling walker (2 wheeled) Gait Pattern/deviations: Step-to pattern;Trunk flexed;Decreased step length - right;Decreased step length - left   Gait velocity interpretation: 1.31 - 2.62 ft/sec, indicative of limited community ambulator General Gait Details: cues for sequence, Right knee extension/heel strike, posture and distance. limited by  fatigue   Stairs Stairs: Yes Stairs assistance: Min guard Stair Management: Step to pattern;Forwards;Two rails Number of Stairs: 2 General stair comments: pt educated for stairs forward with 2 rails, simulating rails with RW overs single step. Pt reports rails can be reached but will be too wide. Pt fatigued and unable to practice further. Educated and demonstrated with handout provided how to perform stairs backward with RW with wife present and educated for assist. Both verbalized understanding   Wheelchair Mobility    Modified Rankin (Stroke Patients Only)       Balance Overall balance assessment: Needs assistance   Sitting balance-Leahy Scale: Good     Standing balance support: Bilateral upper extremity supported Standing balance-Leahy Scale: Poor Standing balance comment: Reliant on BUE support                             Cognition Arousal/Alertness: Awake/alert Behavior During Therapy: WFL for tasks assessed/performed Overall Cognitive Status: Within Functional Limits for tasks assessed                                 General Comments: wife interpreting      Exercises Total Joint Exercises Short Arc Quad: AROM;Right;Seated;10 reps Heel Slides: AAROM;Right;Supine;10 reps Straight Leg Raises: AROM;Right;Supine;10 reps Goniometric ROM: grossly 5-35    General Comments        Pertinent Vitals/Pain Pain Score: 6  Pain Location: rt knee, 3 at rest, 6 with activity Pain Descriptors / Indicators: Aching;Guarding;Constant Pain Intervention(s): Limited activity within patient's tolerance;Monitored  during session;Premedicated before session;Repositioned    Home Living                      Prior Function            PT Goals (current goals can now be found in the care plan section) Progress towards PT goals: Progressing toward goals    Frequency    7X/week      PT Plan Current plan remains appropriate     Co-evaluation              AM-PAC PT "6 Clicks" Mobility   Outcome Measure  Help needed turning from your back to your side while in a flat bed without using bedrails?: None Help needed moving from lying on your back to sitting on the side of a flat bed without using bedrails?: A Little Help needed moving to and from a bed to a chair (including a wheelchair)?: A Little Help needed standing up from a chair using your arms (e.g., wheelchair or bedside chair)?: A Little Help needed to walk in hospital room?: A Little Help needed climbing 3-5 steps with a railing? : A Little 6 Click Score: 19    End of Session Equipment Utilized During Treatment: Gait belt Activity Tolerance: Patient tolerated treatment well Patient left: in bed;with call bell/phone within reach;with family/visitor present Nurse Communication: Mobility status PT Visit Diagnosis: Unsteadiness on feet (R26.81);Muscle weakness (generalized) (M62.81);Other abnormalities of gait and mobility (R26.89)     Time: 4967-5916 PT Time Calculation (min) (ACUTE ONLY): 29 min  Charges:  $Gait Training: 8-22 mins $Therapeutic Exercise: 8-22 mins                     Juan Hughes, PT Acute Rehabilitation Services Pager: [phone] Office: [phone]    Juan Hughes 02/25/2020, 8:58 AM

## 2020-02-25 NOTE — Discharge Summary (Signed)
Patient ID: Juan Hughes MRN: [id number] DOB/AGE: Sep 22, 1955 64 y.o.  Admit date: 02/24/2020 Discharge date: 02/25/2020  Admission Diagnoses:  Principal Problem:   Osteoarthritis of right knee Active Problems:   Status post total knee replacement, right   Discharge Diagnoses:  Same  Past Medical History:  Diagnosis Date  . CHF (congestive heart failure) (HCC)    last ef 40-45% echo 05/2019  . GERD (gastroesophageal reflux disease)   . HFrEF (heart failure with reduced ejection fraction) (HCC)   . Hypertension   . Osteoarthritis of right knee   . Stroke [facility]) 06/2019   left sided weakness    Surgeries: Procedure(s): RIGHT TOTAL KNEE ARTHROPLASTY on 02/24/2020   Consultants:   Discharged Condition: Improved  Hospital Course: Juan Hughes is an 64 y.o. male who was admitted 02/24/2020 for operative treatment ofOsteoarthritis of right knee. Patient has severe unremitting pain that affects sleep, daily activities, and work/hobbies. After pre-op clearance the patient was taken to the operating room on 02/24/2020 and underwent  Procedure(s): RIGHT TOTAL KNEE ARTHROPLASTY.    Patient was given perioperative antibiotics:  Anti-infectives (From admission, onward)   Start     Dose/Rate Route Frequency Ordered Stop   02/24/20 1615  ceFAZolin (ANCEF) IVPB 2g/100 mL premix        2 g 200 mL/hr over 30 Minutes Intravenous Every 6 hours 02/24/20 1601 02/25/20 0126   02/24/20 1455  vancomycin (VANCOCIN) powder  Status:  Discontinued          As needed 02/24/20 1512 02/24/20 1530   02/24/20 1100  ceFAZolin (ANCEF) IVPB 2g/100 mL premix        2 g 200 mL/hr over 30 Minutes Intravenous On call to O.R. 02/24/20 1059 02/24/20 1335       Patient was given sequential compression devices, early ambulation, and chemoprophylaxis to prevent DVT.  Patient benefited maximally from hospital stay and there were no complications.    Recent vital signs:  Patient Vitals for the  past 24 hrs:  BP Temp Temp src Pulse Resp SpO2 Height Weight  02/25/20 0610 130/68 98.3 F (36.8 C) -- 68 16 97 % -- --  02/25/20 0509 -- -- -- 76 16 98 % -- --  02/25/20 0200 122/72 -- -- 64 16 97 % -- --  02/25/20 0000 -- -- -- 74 16 97 % -- --  02/24/20 2156 123/64 98.1 F (36.7 C) -- 68 16 98 % -- --  02/24/20 1911 124/70 -- -- 66 16 100 % -- --  02/24/20 1804 132/73 -- -- (!) 59 16 99 % -- --  02/24/20 1735 -- -- -- 64 16 99 % -- --  02/24/20 1713 135/69 -- -- 66 18 99 % -- --  02/24/20 1645 133/75 (!) 97.5 F (36.4 C) -- (!) 58 17 100 % -- --  02/24/20 1630 131/72 -- -- (!) 55 13 99 % -- --  02/24/20 1615 125/72 -- -- (!) 57 18 100 % -- --  02/24/20 1600 125/73 -- -- (!) 57 12 99 % -- --  02/24/20 1545 118/71 -- -- 61 12 96 % -- --  02/24/20 1533 116/67 97.7 F (36.5 C) -- (!) 59 12 100 % -- --  02/24/20 1250 138/76 -- -- (!) 58 16 100 % -- --  02/24/20 1245 128/72 -- -- 61 (!) 23 100 % -- --  02/24/20 1243 -- -- -- 64 20 100 % -- --  02/24/20 1242 -- -- -- Marland Kitchen  107 19 96 % -- --  02/24/20 1240 125/71 -- -- -- -- 100 % -- --  02/24/20 1135 119/66 97.9 F (36.6 C) Oral 66 17 97 %  (1.651 m) 70.4 kg     Recent laboratory studies:  Recent Labs    02/22/20 1000  WBC 5.6  HGB 12.6*  HCT 40.9  PLT 244  NA 139  K 4.9  CL 108  CO2 22  BUN 30*  CREATININE 1.64*  GLUCOSE 106*  INR 1.0  CALCIUM 9.6     Discharge Medications:   Allergies as of 02/25/2020   No Known Allergies     Medication List    STOP taking these medications   acetaminophen 650 MG CR tablet Commonly known as: TYLENOL   aspirin 325 MG EC tablet   Voltaren 1 % Gel Generic drug: diclofenac Sodium     TAKE these medications   atorvastatin 80 MG tablet Commonly known as: LIPITOR Take 1 tablet (80 mg total) by mouth daily at 6 PM.   carvedilol 25 MG tablet Commonly known as: COREG Take 1 tablet (25 mg total) by mouth 2 (two) times daily.   docusate sodium 100 MG capsule Commonly  known as: Colace Take 1 capsule (100 mg total) by mouth daily as needed.   hydrALAZINE 100 MG tablet Commonly known as: APRESOLINE Take 1 tablet (100 mg total) by mouth every 8 (eight) hours.   losartan 100 MG tablet Commonly known as: COZAAR Take 1 tablet (100 mg total) by mouth daily.   Melatonin 1 MG Caps Take 3 mg by mouth at bedtime.   methocarbamol 750 MG tablet Commonly known as: ROBAXIN Take 1 tablet (750 mg total) by mouth 2 (two) times daily as needed for muscle spasms.   ondansetron 4 MG tablet Commonly known as: Zofran Take 1 tablet (4 mg total) by mouth every 8 (eight) hours as needed for nausea or vomiting.   oxyCODONE-acetaminophen 5-325 MG tablet Commonly known as: Percocet Take 1-2 tablets by mouth every 6 (six) hours as needed.   rivaroxaban 10 MG Tabs tablet Commonly known as: XARELTO Take 1 tablet (10 mg total) by mouth daily.   spironolactone 25 MG tablet Commonly known as: ALDACTONE Take 1 tablet (25 mg total) by mouth daily.            Durable Medical Equipment  (From admission, onward)         Start     Ordered   02/24/20 1600  DME Walker rolling  Once       Question:  Patient needs a walker to treat with the following condition  Answer:  Total knee replacement status   02/24/20 1559   02/24/20 1600  DME 3 n 1  Once        02/24/20 1559   02/24/20 1600  DME Bedside commode  Once       Question:  Patient needs a bedside commode to treat with the following condition  Answer:  Total knee replacement status   02/24/20 1559          Diagnostic Studies: DG Chest 2 View  Result Date: 02/22/2020 CLINICAL DATA:  Preoperative evaluation for knee surgery. EXAM: CHEST - 2 VIEW COMPARISON:  None. FINDINGS: Mild enlargement the cardiac silhouette. Calcific atherosclerosis of the aorta. Tortuosity of the aorta. No consolidation. No pleural effusions. No pneumothorax. IMPRESSION: 1. No acute cardiopulmonary disease. 2. Mild cardiomegaly.  Electronically Signed   By: Feliberto Harts MD  On: 02/22/2020 15:18   ECHOCARDIOGRAM COMPLETE  Result Date: 02/04/2020    ECHOCARDIOGRAM REPORT   Patient Name:   Juan Hughes Date of Exam: 02/04/2020 Medical Rec #:  [id number]              Height:       64.5 in Accession #:    [id number]             Weight:       155.2 lb Date of Birth:  1956-01-17              BSA:          1.766 m Patient Age:    64 years               BP:           118/70 mmHg Patient Gender: M                      HR:           63 bpm. Exam Location:  [facility] [facility] Procedure: 2D Echo, 3D Echo, Cardiac Doppler and Color Doppler Indications:    HFrEF  History:        Patient has prior history of Echocardiogram examinations, most                 recent 06/19/2019. CHF, Stroke; Risk Factors:Hypertension.  Sonographer:    Joaquim Nam Referring Phys: [id number] BRIAN AGBOR-ETANG IMPRESSIONS  1. Left ventricular ejection fraction, by estimation, is 55 to 60%. The left ventricle has normal function. The left ventricle has no regional wall motion abnormalities. Left ventricular diastolic parameters are consistent with Grade I diastolic dysfunction (impaired relaxation).  2. Right ventricular systolic function is normal. The right ventricular size is normal. There is normal pulmonary artery systolic pressure.  3. Left atrial size was mildly dilated.  4. The mitral valve is normal in structure. No evidence of mitral valve regurgitation. No evidence of mitral stenosis.  5. The aortic valve is normal in structure. Aortic valve regurgitation is not visualized. No aortic stenosis is present.  6. The inferior vena cava is normal in size with greater than 50% respiratory variability, suggesting right atrial pressure of 3 mmHg. Comparison(s): Improved EF. FINDINGS  Left Ventricle: Left ventricular ejection fraction, by estimation, is 55 to 60%. The left ventricle has normal function. The left ventricle has no regional wall motion abnormalities. The left  ventricular internal cavity size was normal in size. There is  no left ventricular hypertrophy. Left ventricular diastolic parameters are consistent with Grade I diastolic dysfunction (impaired relaxation). Right Ventricle: The right ventricular size is normal. No increase in right ventricular wall thickness. Right ventricular systolic function is normal. There is normal pulmonary artery systolic pressure. The tricuspid regurgitant velocity is 2.54 m/s, and  with an assumed right atrial pressure of 3 mmHg, the estimated right ventricular systolic pressure is 28.8 mmHg. Left Atrium: Left atrial size was mildly dilated. Right Atrium: Right atrial size was normal in size. Pericardium: There is no evidence of pericardial effusion. Mitral Valve: The mitral valve is normal in structure. No evidence of mitral valve regurgitation. No evidence of mitral valve stenosis. Tricuspid Valve: The tricuspid valve is normal in structure. Tricuspid valve regurgitation is not demonstrated. No evidence of tricuspid stenosis. Aortic Valve: The aortic valve is normal in structure. Aortic valve regurgitation is not visualized. No aortic stenosis is present. Pulmonic Valve: The pulmonic valve was normal in structure.  Pulmonic valve regurgitation is not visualized. No evidence of pulmonic stenosis. Aorta: The aortic root is normal in size and structure. Venous: The inferior vena cava is normal in size with greater than 50% respiratory variability, suggesting right atrial pressure of 3 mmHg. IAS/Shunts: No atrial level shunt detected by color flow Doppler.  LEFT VENTRICLE PLAX 2D LVIDd:         4.40 cm  Diastology LVIDs:         3.20 cm  LV e' medial:    6.09 cm/s LV PW:         1.00 cm  LV E/e' medial:  10.1 LV IVS:        1.30 cm  LV e' lateral:   7.18 cm/s LVOT diam:     2.00 cm  LV E/e' lateral: 8.5 LV SV:         65 LV SV Index:   37 LVOT Area:     3.14 cm                          3D Volume EF:                         3D EF:        56 %                          LV EDV:       178 ml                         LV ESV:       78 ml                         LV SV:        100 ml RIGHT VENTRICLE RV S prime:     14.30 cm/s TAPSE (M-mode): 2.7 cm LEFT ATRIUM             Index       RIGHT ATRIUM           Index LA diam:        3.40 cm 1.93 cm/m  RA Area:     15.30 cm LA Vol (A2C):   63.2 ml 35.79 ml/m RA Volume:   40.60 ml  22.99 ml/m LA Vol (A4C):   25.3 ml 14.33 ml/m LA Biplane Vol: 40.8 ml 23.10 ml/m  AORTIC VALVE LVOT Vmax:   90.10 cm/s LVOT Vmean:  61.200 cm/s LVOT VTI:    0.207 m  AORTA Ao Root diam: 3.00 cm Ao Asc diam:  3.00 cm MITRAL VALVE               TRICUSPID VALVE MV Area (PHT): 2.83 cm    TR Peak grad:   25.8 mmHg MV Decel Time: 268 msec    TR Vmax:        254.00 cm/s MV E velocity: 61.30 cm/s MV A velocity: 77.60 cm/s  SHUNTS MV E/A ratio:  0.79        Systemic VTI:  0.21 m                            Systemic Diam: 2.00 cm Debbe Odea MD Electronically signed by Debbe Odea MD Signature Date/Time: 02/04/2020/4:18:05 PM  Final     Disposition: Discharge disposition: 01-Home or Self Care            Signed: Cristie Hem 02/25/2020, 8:04 AM

## 2020-02-25 NOTE — Telephone Encounter (Signed)
[facility] pharmacy called stating they have the oxycodone rx ready and the pt has been notified so we don't have to send the rx elsewhere

## 2020-02-25 NOTE — Discharge Instructions (Signed)
Information for Discharge Teaching: EXPAREL (bupivacaine liposome injectable suspension)   Your surgeon or anesthesiologist gave you EXPAREL(bupivacaine) to help control your pain after surgery.   EXPAREL is a local anesthetic that provides pain relief by numbing the tissue around the surgical site.  EXPAREL is designed to release pain medication over time and can control pain for up to 72 hours.  Depending on how you respond to EXPAREL, you may require less pain medication during your recovery.  Possible side effects:  Temporary loss of sensation or ability to move in the area where bupivacaine was injected.  Nausea, vomiting, constipation  Rarely, numbness and tingling in your mouth or lips, lightheadedness, or anxiety may occur.  Call your doctor right away if you think you may be experiencing any of these sensations, or if you have other questions regarding possible side effects.  Follow all other discharge instructions given to you by your surgeon or nurse. Eat a healthy diet and drink plenty of water or other fluids.  If you return to the hospital for any reason within 96 hours following the administration of EXPAREL, it is important for health care providers to know that you have received this anesthetic. A teal colored band has been placed on your arm with the date, time and amount of EXPAREL you have received in order to alert and inform your health care providers. Please leave this armband in place for the full 96 hours following administration, and then you may remove the band.  Dental Antibiotics:  In most cases prophylactic antibiotics for Dental procdeures after total joint surgery are not necessary.  Exceptions are as follows:  1. History of prior total joint infection  2. Severely immunocompromised (Organ Transplant, cancer chemotherapy, Rheumatoid biologic meds such as Humera)  3. Poorly controlled diabetes (A1C &gt; 8.0, blood glucose over 200)  If you have one  of these conditions, contact your surgeon for an antibiotic prescription, prior to your dental procedure.

## 2020-02-25 NOTE — Progress Notes (Signed)
Subjective: 1 Day Post-Op Procedure(s) (LRB): RIGHT TOTAL KNEE ARTHROPLASTY (Right) Patient reports pain as mild.    Objective: Vital signs in last 24 hours: Temp:  [97.5 F (36.4 C)-98.3 F (36.8 C)] 98.3 F (36.8 C) (10/07 0610) Pulse Rate:  [55-107] 68 (10/07 0610) Resp:  [12-23] 16 (10/07 0610) BP: (116-138)/(64-76) 130/68 (10/07 0610) SpO2:  [96 %-100 %] 97 % (10/07 0610) Weight:  [70.4 kg] 70.4 kg (10/06 1135)  Intake/Output from previous day: 10/06 0701 - 10/07 0700 In: 2433 [P.O.:360; I.V.:1873; IV Piggyback:200] Out: 1450 [Urine:1425; Blood:25] Intake/Output this shift: No intake/output data recorded.  Recent Labs    02/22/20 1000  HGB 12.6*   Recent Labs    02/22/20 1000  WBC 5.6  RBC 4.44  HCT 40.9  PLT 244   Recent Labs    02/22/20 1000  NA 139  K 4.9  CL 108  CO2 22  BUN 30*  CREATININE 1.64*  GLUCOSE 106*  CALCIUM 9.6   Recent Labs    02/22/20 1000  INR 1.0    Neurologically intact Neurovascular intact Sensation intact distally Intact pulses distally Dorsiflexion/Plantar flexion intact Incision: dressing C/D/I No cellulitis present Compartment soft   Assessment/Plan: 1 Day Post-Op Procedure(s) (LRB): RIGHT TOTAL KNEE ARTHROPLASTY (Right) Advance diet Up with therapy D/C IV fluids Discharge home with home health  WBAT RLE     Patient's anticipated LOS is less than 2 midnights, meeting these requirements: - Younger than 35 - Lives within 1 hour of care - Has a competent adult at home to recover with post-op recover - NO history of  - Chronic pain requiring opiods  - Diabetes  - Coronary Artery Disease  - Heart failure  - Heart attack  - Stroke  - DVT/VTE  - Cardiac arrhythmia  - Respiratory Failure/COPD  - Renal failure  - Anemia  - Advanced Liver disease       Cristie Hem 02/25/2020, 7:55 AM

## 2020-03-02 ENCOUNTER — Ambulatory Visit: Payer: Self-pay | Admitting: Physical Therapy

## 2020-03-02 ENCOUNTER — Encounter (HOSPITAL_BASED_OUTPATIENT_CLINIC_OR_DEPARTMENT_OTHER): Payer: Self-pay | Admitting: Orthopaedic Surgery

## 2020-03-03 ENCOUNTER — Ambulatory Visit: Payer: Self-pay | Attending: Orthopaedic Surgery | Admitting: Physical Therapy

## 2020-03-03 ENCOUNTER — Encounter: Payer: Self-pay | Admitting: Physical Therapy

## 2020-03-03 ENCOUNTER — Telehealth: Payer: Self-pay | Admitting: Orthopaedic Surgery

## 2020-03-03 ENCOUNTER — Other Ambulatory Visit: Payer: Self-pay

## 2020-03-03 ENCOUNTER — Other Ambulatory Visit: Payer: Self-pay | Admitting: Physician Assistant

## 2020-03-03 DIAGNOSIS — G8929 Other chronic pain: Secondary | ICD-10-CM | POA: Insufficient documentation

## 2020-03-03 DIAGNOSIS — R262 Difficulty in walking, not elsewhere classified: Secondary | ICD-10-CM | POA: Insufficient documentation

## 2020-03-03 DIAGNOSIS — R2689 Other abnormalities of gait and mobility: Secondary | ICD-10-CM | POA: Insufficient documentation

## 2020-03-03 DIAGNOSIS — M25562 Pain in left knee: Secondary | ICD-10-CM | POA: Insufficient documentation

## 2020-03-03 DIAGNOSIS — M25561 Pain in right knee: Secondary | ICD-10-CM | POA: Insufficient documentation

## 2020-03-03 MED ORDER — OXYCODONE-ACETAMINOPHEN 5-325 MG PO TABS: 1.0000 | ORAL_TABLET | Freq: Four times a day (QID) | ORAL | 0 refills | Status: DC | PRN

## 2020-03-03 NOTE — Telephone Encounter (Signed)
Sent in

## 2020-03-03 NOTE — Therapy (Signed)
[facility] PHYSICAL AND SPORTS MEDICINE [address] [place], [address] Phone: [phone]   Fax:  [phone]  Physical Therapy Evaluation  Patient Details  Name: Juan Hughes MRN: [id number] Date of Birth: 09-Nov-1955 No data recorded  Encounter Date: 03/03/2020   PT End of Session - 03/03/20 1735    Visit Number 1    Number of Visits 17    Date for PT Re-Evaluation 04/29/20    PT Start Time 0500    PT Stop Time 0545    PT Time Calculation (min) 45 min    Activity Tolerance Patient tolerated treatment well    Behavior During Therapy [employer] for tasks assessed/performed           Past Medical History:  Diagnosis Date  . CHF (congestive heart failure) (HCC)    last ef 40-45% echo 05/2019  . GERD (gastroesophageal reflux disease)   . HFrEF (heart failure with reduced ejection fraction) (HCC)   . Hypertension   . Osteoarthritis of right knee   . Stroke [facility]) 06/2019   left sided weakness    Past Surgical History:  Procedure Laterality Date  . INNER EAR SURGERY     as teenager  . TOTAL KNEE ARTHROPLASTY Right 02/24/2020   Procedure: RIGHT TOTAL KNEE ARTHROPLASTY;  Surgeon: Tarry Kos, MD;  Location: [facility];  Service: Orthopedics;  Laterality: Right;    There were no vitals filed for this visit.    Subjective Assessment - 03/03/20 1701    Pertinent History Pt is a 64 year old male familiar with this clinic presenting with R TKA 02/24/20. Patient being seen for stroke deficits with R sided weakness, deconditioning, postural dysfunction and pain prior to replacement, with hopes TKA would aid in ind function. Patient ambulates into clinic with RW, is using RW in home walking across his house, and using CPM 2 hours 4x/day. Has 5 stairs to enter/exit stairs and uses rail with family member assisting him with RW. He is dressing with minA from his wife to aid with donning/doffing pants, wife assists him in  lifting his RLE into bath where he has a seat he is able to bath himself, and wife applies R shoes. He has been completing ankle pumps and AAROM SLR with wife, but did not have HHPT, does not have a regular exercise regimen. Pt goal is to ambulate without AD and dance. Pain is achy, post surgical, reports following walking into clinic 20/10 (10/10), best pain 0/10 at rest with medication.    Limitations Lifting;House hold activities;Walking    How long can you sit comfortably? unlimited    How long can you stand comfortably? 5-48mins    How long can you walk comfortably? 1-30mins    Patient Stated Goals Ambulate without AD and dance    Currently in Pain? Yes    Pain Score 5     Pain Location Knee    Pain Orientation Right    Pain Descriptors / Indicators Aching    Pain Type Surgical pain    Pain Radiating Towards none    Pain Onset 1 to 4 weeks ago    Pain Frequency Intermittent    Aggravating Factors  standing, walking, lifting,, bending    Pain Relieving Factors rest, medication    Effect of Pain on Daily Activities unable to ambulate, dress, bathe ind, or drive             OBJECTIVE  MUSCULOSKELETAL: Tremor:  Absent Bulk: Normal Tone: Normal, no spasticity, rigidity, or clonus No trophic changes noted to lower extremities. No ecchymosis, erythema, or edema noted around knee. No gross knee deformity noted  Posture bilat knee varus L>R, forward trunk lean, increased bilat hip flex  Lumbar/Hip AROM: WFL and painless with overpressure in all planes  Gait Forward trunk lean, decreased hip ext bilat, decreased R step length and R knee flex through swing  Palpation TTP to entire joint, notable swelling at  Strength R/L 5/5 Hip flexion 5/5 Hip external rotation 5/5 Hip internal rotation 5/5 Hip extension  5/5 Hip abduction 5/5 Hip adduction 2+/5 Knee extension 2+/5 Knee flexion 5/5 Ankle Dorsiflexion 5/5 Ankle Plantarflexion *indicates  pain  AROM  Knee R/L Flexion: 71/130  Extension: 12/0 *indicates pain   PROM  Knee R/L Flexion: 86/130  Extension: 5/0 *indicates pain   All hip/ankle mobility WNL bilat    Muscle Length Hamstring length: slight limitation bilat Quad length Michela Pitcher): limited d/t joint ROM Hip Flexor length Maisie Fus): tension bilat IT band length Claiborne Rigg): WNL bilat  Passive Accessory Motion Superior Tibiofibular Joint: WNL Knee: empty end feel Patella: decreased mobility R Ankle: WNL bilat  NEUROLOGICAL:  Mental Status Patient is oriented to person, place and time.  Recent memory is intact.  Remote memory is intact.  Attention span and concentration are intact.  Expressive speech is intact.  Patient's fund of knowledge is within normal limits for educational level.  Sensation Grossly intact to light touch bilateral LEs as determined by testing dermatomes L2-S2 Proprioception and hot/cold testing deferred on this date  FUNCTIONAL TESTS 5xsts 40sec use of UE for initiation and controlled descent Sit to stand: use of UE for initiation and controlled descent; decreased RLE WB  Ther-Ex PT reviewed the following HEP with patient with patient able to demonstrate a set of the following with min cuing for correction needed. PT educated patient on parameters of therex (how/when to inc/decrease intensity, frequency, rep/set range, stretch hold time, and purpose of therex) with verbalized understanding.  Access Code: [email or web address] Prone Hip Extension - 1 x daily - 7 x weekly - 3 sets - 10 reps Sidelying Hip Abduction - 1 x daily - 7 x weekly - 3 sets - 10 reps Supine Active Straight Leg Raise - 1 x daily - 7 x weekly - 3 sets - 10 reps Seated Knee Flexion Extension AROM - 3 x daily - 7 x weekly - 20 reps - 3sec hold       Objective measurements completed on examination: See above findings.               PT Education - 03/03/20 1711    Education Details Patient was educated on  diagnosis, anatomy and pathology involved, prognosis, role of PT, and was given an HEP, demonstrating exercise with proper form following verbal and tactile cues, and was given a paper hand out to continue exercise at home. Pt was educated on and agreed to plan of care.    Person(s) Educated Patient;Spouse    Methods Explanation;Demonstration;Tactile cues;Verbal cues    Comprehension Verbalized understanding;Returned demonstration;Verbal cues required;Tactile cues required            PT Short Term Goals - 03/03/20 1748      PT SHORT TERM GOAL #1   Title Pt will be independent with HEP in order to improve strength and balance in order to decrease fall risk and improve function at home and work.    Baseline 03/03/20 HEP given  Time 4    Period Weeks    Status New      PT SHORT TERM GOAL #2   Title Pt will decrease 5TSTS by at least 3 seconds in order to demonstrate clinically significant improvement in LE strength    Baseline 03/03/20 40sec    Time 4    Period Weeks    Status New      PT SHORT TERM GOAL #3   Title Pt will increase to at least 0.85m/s in order to demonstrate normalized household ambulation with LRD to reduce fall risk.    Baseline 03/03/20 unable to complete distance    Time 4    Period Weeks    Status New             PT Long Term Goals - 03/03/20 1746      PT LONG TERM GOAL #1   Title Pt will increase to at least 1.38m/s in order to demonstrate normalized community ambulation with LRD to reduce fall risk.    Baseline 03/03/20 unable to complete distance, 0.3m/s at prior d/c    Time 8    Period Weeks    Status New      PT LONG TERM GOAL #2   Title Pt will decrease 5TSTS by to at least 12seconds in order to demonstrate normalized LE strength needed for safe, ind transfers    Baseline 03/03/20 40sec    Time 8    Period Weeks    Status New      PT LONG TERM GOAL #3   Title Patient will increase FOTO score to 57 to demonstrate predicted  increase in functional mobility to complete ADLs    Baseline 03/03/20 30    Time 8    Period Weeks    Status New      PT LONG TERM GOAL #4   Title Pt will decrease worst pain as reported on NPRS by at least 3 points in order to demonstrate clinically significant reduction in pain.    Baseline 03/03/20 8/10    Time 8    Period Weeks    Status New                  Plan - 03/03/20 1749    Clinical Impression Statement PT is a 64 year male familiar with this clinic following recent rehabilitation for CVA with L sided motor deficits. Previous POC interrupted by R TKA, that he is seen for evaluation of today. Impairments in R knee pain, postural dysfunction, gait deficits, decreased knee mobility, and decreased strength. Activity limitations in transferring, ambulation, lifting, carrying, stair negotiation; inhibiting his ability to participate in ADLs and community activity.    Personal Factors and Comorbidities Comorbidity 1;Comorbidity 2;Comorbidity 3+;Education    Comorbidities HTN, CHF, CVA Jan 2021    Examination-Activity Limitations Bathing;Lift;Dressing;Sit;Stand;Transfers    Examination-Participation Restrictions Cleaning;Community Activity;Yard Work;Driving    Stability/Clinical Decision Making Evolving/Moderate complexity    Clinical Decision Making Moderate    Rehab Potential Good    PT Frequency 2x / week    PT Duration 3 weeks    PT Treatment/Interventions ADLs/Self Care Home Management;Cryotherapy;Ultrasound;Gait training;Stair training;Therapeutic activities;Therapeutic exercise;Neuromuscular re-education;Patient/family education;Manual techniques;Orthotic Fit/Training;Dry needling;Joint Manipulations;Vestibular;Spinal Manipulations;Passive range of motion;Balance training;Functional mobility training;Moist Heat;Electrical Stimulation;DME Instruction    PT Next Visit Plan HEP review, ROM/strength    PT Home Exercise Plan SLR, sidelying abd, prone hip ext, heel slides     Consulted and Agree with Plan of Care Patient  Patient will benefit from skilled therapeutic intervention in order to improve the following deficits and impairments:  Abnormal gait, Decreased activity tolerance, Decreased endurance, Decreased range of motion, Decreased strength, Increased fascial restricitons, Improper body mechanics, Impaired sensation, Pain, Postural dysfunction, Impaired tone, Impaired flexibility, Increased muscle spasms, Difficulty walking, Decreased safety awareness, Decreased mobility, Decreased coordination, Decreased balance, Impaired UE functional use  Visit Diagnosis: Acute pain of right knee  Other abnormalities of gait and mobility  Difficulty in walking, not elsewhere classified     Problem List Patient Active Problem List   Diagnosis Date Noted  . 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 Osteoarthritis of right knee 02/23/2020  . Nonischemic cardiomyopathy (HCC)   . HFrEF (heart failure with reduced ejection fraction) (HCC)   . Stroke (HCC) 06/18/2019  . Hypertension   . Left-sided weakness   . Hypokalemia   . Tobacco abuse    Hilda Lias DPT Hilda Lias 03/04/2020, 8:34 AM  [facility] PHYSICAL AND SPORTS MEDICINE [address] [place], [address] Phone: [phone]   Fax:  [phone]  Name: Kamalei Vandervoort MRN: [id number] Date of Birth: 16-Sep-1955

## 2020-03-03 NOTE — Telephone Encounter (Signed)
Pts wife called stating the pt needs a refill of his oxycodone called in and she would like a CB to let her know when its been sent in  [phone]

## 2020-03-04 ENCOUNTER — Encounter: Payer: Self-pay | Admitting: Physical Therapy

## 2020-03-04 ENCOUNTER — Ambulatory Visit: Payer: Self-pay | Admitting: Physical Therapy

## 2020-03-04 DIAGNOSIS — R262 Difficulty in walking, not elsewhere classified: Secondary | ICD-10-CM

## 2020-03-04 DIAGNOSIS — G8929 Other chronic pain: Secondary | ICD-10-CM

## 2020-03-04 DIAGNOSIS — R2689 Other abnormalities of gait and mobility: Secondary | ICD-10-CM

## 2020-03-04 DIAGNOSIS — M25561 Pain in right knee: Secondary | ICD-10-CM

## 2020-03-04 NOTE — Addendum Note (Signed)
Addended by: Lawrence Marseilles on: 03/04/2020 11:04 AM   Modules accepted: Orders

## 2020-03-04 NOTE — Therapy (Signed)
[facility] PHYS[facility] SPORTS MEDICINE [address], [place], [address] Phone: [phone]   Fax:  [phone]  Physical Therapy Treatment  Patient Details  Name: Juan Hughes MRN: [id number] Date of Birth: [birth year] No data recorded  Encounter Date: 03/04/2020   PT End of Session - 03/04/20 1009    Visit Number 2    Number of Visits 17    Date for PT Re-Evaluation 04/29/20    PT Start Time 1002    PT Stop Time 1045    PT Time Calculation (min) 43 min    Equipment Utilized During Treatment Gait belt    Activity Tolerance Patient tolerated treatment well    Behavior During Therapy WFL for tasks assessed/performed           Past Medical History:  Diagnosis Date  . CHF (congestive heart failure) (HCC)    last ef 40-45% echo 05/2019  . GERD (gastroesophageal reflux disease)   . HFrEF (heart failure with reduced ejection fraction) (HCC)   . Hypertension   . Osteoarthritis of right knee   . Stroke [facility]) 06/2019   left sided weakness    Past Surgical History:  Procedure Laterality Date  . INNER EAR SURGERY     as teenager  . TOTAL KNEE ARTHROPLASTY Right 02/24/2020   Procedure: RIGHT TOTAL KNEE ARTHROPLASTY;  Surgeon: Tarry Kos, MD;  Location: [facility];  Service: Orthopedics;  Laterality: Right;    There were no vitals filed for this visit.   Subjective Assessment - 03/04/20 1006    Subjective Pt did not get good sleep yesterday d/t pain. Reports he was unable to complete HEP. Reports 10/10 pain after ambulating into clinic    Pertinent History Pt is a 64 year old male familiar with this clinic presenting with R TKA 02/24/20. Patient being seen for stroke deficits with R sided weakness, deconditioning, postural dysfunction and pain prior to replacement, with hopes TKA would aid in ind function. Patient ambulates into clinic with RW, is using RW in home walking across his house, and using CPM 2  hours 4x/day. Has 5 stairs to enter/exit stairs and uses rail with family member assisting him with RW. He is dressing with minA from his wife to aid with donning/doffing pants, wife assists him in lifting his RLE into bath where he has a seat he is able to bath himself, and wife applies R shoes. He has been completing ankle pumps and AAROM SLR with wife, but did not have HHPT, does not have a regular exercise regimen. Pt goal is to ambulate without AD and dance. Pain is achy, post surgical, reports following walking into clinic 20/10 (10/10), best pain 0/10 at rest with medication.    Limitations Lifting;House hold activities;Walking    How long can you stand comfortably? 5-30mins    How long can you walk comfortably? 1-94mins    Patient Stated Goals Ambulate without AD and dance    Pain Onset 1 to 4 weeks ago             Ther-Ex AAROM R knee flex <> ext with matrix bike, no resistance seat 10 Heel prop supine SLR 2x 10 with increased quad lag through reps Sidelying R abd 2x 10 with max cuing to prevent rotation compensation with decent carry over Prone hip ext 2x 10 with cuing for eccentric control with good carry over; cobra stretch 3x 10sec holds between sets x2 LAQ 2x 12  with cuing for full availible ROM with good carry over Seated hamstring curl YTB approx 130d-90d with cuing for increased ROM with good carry over Seated heel slides x20 with good carry over from eval   Manual Tib/fib distraction + movement into knee flex in sitting x20 3-5sec holds In heel prop post femur mobilization 6x 30sec   Following PROM 7-106d                       PT Education - 03/04/20 1008    Education Details HEP review, therex technique/form    Person(s) Educated Patient    Methods Explanation;Demonstration;Verbal cues    Comprehension Verbalized understanding;Returned demonstration;Verbal cues required            PT Short Term Goals - 03/03/20 1748      PT SHORT TERM  GOAL #1   Title Pt will be independent with HEP in order to improve strength and balance in order to decrease fall risk and improve function at home and work.    Baseline 03/03/20 HEP given    Time 4    Period Weeks    Status New      PT SHORT TERM GOAL #2   Title Pt will decrease 5TSTS by at least 3 seconds in order to demonstrate clinically significant improvement in LE strength    Baseline 03/03/20 40sec    Time 4    Period Weeks    Status New      PT SHORT TERM GOAL #3   Title Pt will increase to at least 0.24m/s in order to demonstrate normalized household ambulation with LRD to reduce fall risk.    Baseline 03/03/20 unable to complete distance    Time 4    Period Weeks    Status New             PT Long Term Goals - 03/03/20 1746      PT LONG TERM GOAL #1   Title Pt will increase to at least 1.17m/s in order to demonstrate normalized community ambulation with LRD to reduce fall risk.    Baseline 03/03/20 unable to complete distance, 0.55m/s at prior d/c    Time 8    Period Weeks    Status New      PT LONG TERM GOAL #2   Title Pt will decrease 5TSTS by to at least 12seconds in order to demonstrate normalized LE strength needed for safe, ind transfers    Baseline 03/03/20 40sec    Time 8    Period Weeks    Status New      PT LONG TERM GOAL #3   Title Patient will increase FOTO score to 57 to demonstrate predicted increase in functional mobility to complete ADLs    Baseline 03/03/20 30    Time 8    Period Weeks    Status New      PT LONG TERM GOAL #4   Title Pt will decrease worst pain as reported on NPRS by at least 3 points in order to demonstrate clinically significant reduction in pain.    Baseline 03/03/20 8/10    Time 8    Period Weeks    Status New                 Plan - 03/04/20 1027    Clinical Impression Statement PT initiated therex progression for increased R knee ROM and strength with success. Patient with some increased pain  through therex, but is able to tolerate progression, and complete all planned therex. Patient is able to comply with cuing for proper technique of therex with good motivation throughout session. PT will continue progression as able.    Personal Factors and Comorbidities Comorbidity 1;Comorbidity 2;Comorbidity 3+;Education    Comorbidities HTN, CHF, CVA Jan 2021    Examination-Activity Limitations Bathing;Lift;Dressing;Sit;Stand;Transfers    Examination-Participation Restrictions Cleaning;Community Activity;Yard Work;Driving    Stability/Clinical Decision Making Evolving/Moderate complexity    Clinical Decision Making Moderate    Rehab Potential Good    PT Frequency 2x / week    PT Duration 3 weeks    PT Treatment/Interventions ADLs/Self Care Home Management;Cryotherapy;Ultrasound;Gait training;Stair training;Therapeutic activities;Therapeutic exercise;Neuromuscular re-education;Patient/family education;Manual techniques;Orthotic Fit/Training;Dry needling;Joint Manipulations;Vestibular;Spinal Manipulations;Passive range of motion;Balance training;Functional mobility training;Moist Heat;Electrical Stimulation;DME Instruction    PT Next Visit Plan ROM/strength    PT Home Exercise Plan SLR, sidelying abd, prone hip ext, heel slides    Consulted and Agree with Plan of Care Patient           Patient will benefit from skilled therapeutic intervention in order to improve the following deficits and impairments:  Abnormal gait, Decreased activity tolerance, Decreased endurance, Decreased range of motion, Decreased strength, Increased fascial restricitons, Improper body mechanics, Impaired sensation, Pain, Postural dysfunction, Impaired tone, Impaired flexibility, Increased muscle spasms, Difficulty walking, Decreased safety awareness, Decreased mobility, Decreased coordination, Decreased balance, Impaired UE functional use  Visit Diagnosis: Acute pain of right knee  Other abnormalities of gait and  mobility  Difficulty in walking, not elsewhere classified  Chronic pain of left knee  Chronic pain of right knee     Problem List Patient Active Problem List   Diagnosis Date Noted  . 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 Osteoarthritis of right knee 02/23/2020  . Nonischemic cardiomyopathy (HCC)   . HFrEF (heart failure with reduced ejection fraction) (HCC)   . Stroke (HCC) 06/18/2019  . Hypertension   . Left-sided weakness   . Hypokalemia   . Tobacco abuse    Hilda Lias DPT Hilda Lias 03/04/2020, 10:44 AM  [facility] [facility] PHYSICAL AND SPORTS MEDICINE [address] [place], [address] Phone: [phone]   Fax:  [phone]  Name: Juan Hughes MRN: [id number] Date of Birth: 15-Sep-1955

## 2020-03-07 ENCOUNTER — Encounter: Payer: Self-pay | Admitting: Physical Therapy

## 2020-03-07 ENCOUNTER — Other Ambulatory Visit: Payer: Self-pay

## 2020-03-07 ENCOUNTER — Ambulatory Visit: Payer: Self-pay | Admitting: Physical Therapy

## 2020-03-07 DIAGNOSIS — R2689 Other abnormalities of gait and mobility: Secondary | ICD-10-CM

## 2020-03-07 DIAGNOSIS — R262 Difficulty in walking, not elsewhere classified: Secondary | ICD-10-CM

## 2020-03-07 DIAGNOSIS — M25561 Pain in right knee: Secondary | ICD-10-CM

## 2020-03-07 DIAGNOSIS — M25562 Pain in left knee: Secondary | ICD-10-CM

## 2020-03-07 DIAGNOSIS — G8929 Other chronic pain: Secondary | ICD-10-CM

## 2020-03-07 NOTE — Therapy (Signed)
[place] [facility] [address] [place], [place], [address] Phone: [phone]   Fax:  [phone]  Physical Therapy Treatment  Patient Details  Name: Juan Hughes MRN: [id number] Date of Birth: 09/09/1955 No data recorded  Encounter Date: 03/07/2020   PT End of Session - 03/07/20 1040    Visit Number 3    Number of Visits 17    Date for PT Re-Evaluation 04/29/20    PT Start Time 1034    PT Stop Time 1113    PT Time Calculation (min) 39 min    Equipment Utilized During Treatment Gait belt    Activity Tolerance Patient tolerated treatment well    Behavior During Therapy [facility] for tasks assessed/performed           Past Medical History:  Diagnosis Date   CHF (congestive heart failure) (HCC)    last ef 40-45% echo 05/2019   GERD (gastroesophageal reflux disease)    HFrEF (heart failure with reduced ejection fraction) (HCC)    Hypertension    Osteoarthritis of right knee    Stroke (HCC) 06/2019   left sided weakness    Past Surgical History:  Procedure Laterality Date   INNER EAR SURGERY     as teenager   TOTAL KNEE ARTHROPLASTY Right 02/24/2020   Procedure: RIGHT TOTAL KNEE ARTHROPLASTY;  Surgeon: Tarry Kos, MD;  Location: [facility];  Service: Orthopedics;  Laterality: Right;    There were no vitals filed for this visit.   Subjective Assessment - 03/07/20 1038    Subjective Reports he completed HEP and utilized CPM over the weekend and that he can tell motion is improving, he is able to dress himself and put on his own socks and shoes. Reports 10/10 pain today.    Pertinent History Pt is a 64 year old male familiar with this clinic presenting with R TKA 02/24/20. Patient being seen for stroke deficits with R sided weakness, deconditioning, postural dysfunction and pain prior to replacement, with hopes TKA would aid in ind function. Patient ambulates into clinic with RW, is  using RW in home walking across his house, and using CPM 2 hours 4x/day. Has 5 stairs to enter/exit stairs and uses rail with family member assisting him with RW. He is dressing with minA from his wife to aid with donning/doffing pants, wife assists him in lifting his RLE into bath where he has a seat he is able to bath himself, and wife applies R shoes. He has been completing ankle pumps and AAROM SLR with wife, but did not have HHPT, does not have a regular exercise regimen. Pt goal is to ambulate without AD and dance. Pain is achy, post surgical, reports following walking into clinic 20/10 (10/10), best pain 0/10 at rest with medication.    Limitations Lifting;House hold activities;Walking    How long can you sit comfortably? unlimited    How long can you stand comfortably? 5-32mins    How long can you walk comfortably? 1-53mins    Patient Stated Goals Ambulate without AD and dance    Pain Onset 1 to 4 weeks ago            Ther-Ex AAROM R knee flex <> ext with matrix bike, no resistance seat 10 SLR 2x 10 with increased quad lag through reps; Heel prop between sets Prone hip ext 2x 10 with cuing for eccentric control with decent carry over; cobra stretch 30sec Supine  hamstring curl x12 approx 100d; RTB approx 160d-90d with cuing for eccentric control with good carry over LAQ 2# AW 3x 10 with min cuing for eccentric control with good carry over Mini squat to elevated mat table x10; with LLE on balance stone for increased RLE WB 2x 10; cuing for technique with decent carry over   Manual Tib/fib distraction + movement into knee flex in sitting x20 3-5sec holds In heel prop post femur mobilization 6x 30sec   Following PROM 7-111d                    PT Education - 03/07/20 1039    Education Details therex form/technique    Person(s) Educated Patient    Methods Explanation;Demonstration;Verbal cues    Comprehension Verbalized understanding;Returned demonstration;Verbal  cues required            PT Short Term Goals - 03/03/20 1748      PT SHORT TERM GOAL #1   Title Pt will be independent with HEP in order to improve strength and balance in order to decrease fall risk and improve function at home and work.    Baseline 03/03/20 HEP given    Time 4    Period Weeks    Status New      PT SHORT TERM GOAL #2   Title Pt will decrease 5TSTS by at least 3 seconds in order to demonstrate clinically significant improvement in LE strength    Baseline 03/03/20 40sec    Time 4    Period Weeks    Status New      PT SHORT TERM GOAL #3   Title Pt will increase to at least 0.62m/s in order to demonstrate normalized household ambulation with LRD to reduce fall risk.    Baseline 03/03/20 unable to complete distance    Time 4    Period Weeks    Status New             PT Long Term Goals - 03/03/20 1746      PT LONG TERM GOAL #1   Title Pt will increase to at least 1.5m/s in order to demonstrate normalized community ambulation with LRD to reduce fall risk.    Baseline 03/03/20 unable to complete distance, 0.51m/s at prior d/c    Time 8    Period Weeks    Status New      PT LONG TERM GOAL #2   Title Pt will decrease 5TSTS by to at least 12seconds in order to demonstrate normalized LE strength needed for safe, ind transfers    Baseline 03/03/20 40sec    Time 8    Period Weeks    Status New      PT LONG TERM GOAL #3   Title Patient will increase FOTO score to 57 to demonstrate predicted increase in functional mobility to complete ADLs    Baseline 03/03/20 30    Time 8    Period Weeks    Status New      PT LONG TERM GOAL #4   Title Pt will decrease worst pain as reported on NPRS by at least 3 points in order to demonstrate clinically significant reduction in pain.    Baseline 03/03/20 8/10    Time 8    Period Weeks    Status New                 Plan - 03/07/20 1104    Clinical Impression Statement PT continued  therex  progression for increased R knee ROM and strength with success. Pt does have some pain throughout session, but is motivated to work through this to complete all therex with proper technique following multimodal cuing. PT will continue progression as able.    Comorbidities HTN, CHF, CVA Jan 2021    Examination-Activity Limitations Bathing;Lift;Dressing;Sit;Stand;Transfers    Examination-Participation Restrictions Cleaning;Community Activity;Yard Work;Driving    Stability/Clinical Decision Making Evolving/Moderate complexity    Clinical Decision Making Moderate    Rehab Potential Good    PT Frequency 2x / week    PT Duration 3 weeks    PT Treatment/Interventions ADLs/Self Care Home Management;Cryotherapy;Ultrasound;Gait training;Stair training;Therapeutic activities;Therapeutic exercise;Neuromuscular re-education;Patient/family education;Manual techniques;Orthotic Fit/Training;Dry needling;Joint Manipulations;Vestibular;Spinal Manipulations;Passive range of motion;Balance training;Functional mobility training;Moist Heat;Electrical Stimulation;DME Instruction    PT Next Visit Plan ROM/strength    PT Home Exercise Plan SLR, sidelying abd, prone hip ext, heel slides    Consulted and Agree with Plan of Care Patient           Patient will benefit from skilled therapeutic intervention in order to improve the following deficits and impairments:  Abnormal gait, Decreased activity tolerance, Decreased endurance, Decreased range of motion, Decreased strength, Increased fascial restricitons, Improper body mechanics, Impaired sensation, Pain, Postural dysfunction, Impaired tone, Impaired flexibility, Increased muscle spasms, Difficulty walking, Decreased safety awareness, Decreased mobility, Decreased coordination, Decreased balance, Impaired UE functional use  Visit Diagnosis: Acute pain of right knee  Other abnormalities of gait and mobility  Difficulty in walking, not elsewhere classified  Chronic  pain of left knee  Chronic pain of right knee     Problem List Patient Active Problem List   Diagnosis Date Noted   Status post total knee replacement, 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 [place], [address] Phone: [phone]   Fax:  [phone]  Name: Juan Hughes MRN: [id number] Date of Birth: 31-Aug-1955

## 2020-03-09 ENCOUNTER — Ambulatory Visit (INDEPENDENT_AMBULATORY_CARE_PROVIDER_SITE_OTHER): Payer: Self-pay | Admitting: Physician Assistant

## 2020-03-09 ENCOUNTER — Encounter: Payer: Self-pay | Admitting: Orthopaedic Surgery

## 2020-03-09 ENCOUNTER — Other Ambulatory Visit: Payer: Self-pay

## 2020-03-09 DIAGNOSIS — Z96651 Presence of right artificial knee joint: Secondary | ICD-10-CM

## 2020-03-09 MED ORDER — OXYCODONE-ACETAMINOPHEN 5-325 MG PO TABS: 1.0000 | ORAL_TABLET | Freq: Four times a day (QID) | ORAL | 0 refills | Status: DC | PRN

## 2020-03-09 NOTE — Progress Notes (Signed)
   Post-Op Visit Note   Patient: Juan Hughes           Date of Birth: 1955/09/27           MRN: [id number] Visit Date: 03/09/2020 PCP: Gorden Harms, PA-C   Assessment & Plan:  Chief Complaint:  Chief Complaint  Patient presents with  . Right Knee - Pain   Visit Diagnoses:  1. History of total knee replacement, right     Plan: Patient is a pleasant 64 year old gentleman who comes in today with Spanish-speaking interpreted.  He is 2 weeks out right total knee replacement 02/24/2020.  He has been doing fairly well but notes moderate amount of pain.  He has been in outpatient physical therapy making progress.  He is ambulating with a walker.  He is taking Xarelto for DVT prophylaxis.  Examination of his right knee reveals a well-healing surgical incision with nylon sutures in place.  No evidence of infection or cellulitis.  Calf soft nontender.  He is neurovascular intact distally.  At this point, he will continue with physical therapy.  Dental prophylaxis reinforced.  I will refill his pain medication.  He will follow up with [place] in 4 weeks time for repeat evaluation and AP lateral x-rays of the right knee.  Follow-Up Instructions: Return in about 4 weeks (around 04/06/2020).   Orders:  No orders of the defined types were placed in this encounter.  No orders of the defined types were placed in this encounter.   Imaging: No new imaging  PMFS History: Patient Active Problem List   Diagnosis Date Noted  . 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 Osteoarthritis of right knee 02/23/2020  . Nonischemic cardiomyopathy (HCC)   . HFrEF (heart failure with reduced ejection fraction) (HCC)   . Stroke (HCC) 06/18/2019  . Hypertension   . Left-sided weakness   . Hypokalemia   . Tobacco abuse    Past Medical History:  Diagnosis Date  . CHF (congestive heart failure) (HCC)    last ef 40-45% echo 05/2019  . GERD (gastroesophageal reflux disease)   . HFrEF  (heart failure with reduced ejection fraction) (HCC)   . Hypertension   . Osteoarthritis of right knee   . Stroke [facility]) 06/2019   left sided weakness    Family History  Problem Relation Age of Onset  . Hypertension Mother     Past Surgical History:  Procedure Laterality Date  . INNER EAR SURGERY     as teenager  . TOTAL KNEE ARTHROPLASTY Right 02/24/2020   Procedure: RIGHT TOTAL KNEE ARTHROPLASTY;  Surgeon: Tarry Kos, MD;  Location: [facility];  Service: Orthopedics;  Laterality: Right;   Social History   Occupational History  . Not on file  Tobacco Use  . Smoking status: Former Smoker    Types: Cigars    Quit date: 06/22/2019    Years since quitting: 0.7  . Smokeless tobacco: Never Used  Vaping Use  . Vaping Use: Never used  Substance and Sexual Activity  . Alcohol use: Never  . Drug use: Never  . Sexual activity: Yes

## 2020-03-14 ENCOUNTER — Encounter: Payer: Self-pay | Admitting: Physical Therapy

## 2020-03-14 ENCOUNTER — Other Ambulatory Visit: Payer: Self-pay

## 2020-03-14 ENCOUNTER — Ambulatory Visit: Payer: Self-pay | Admitting: Physical Therapy

## 2020-03-14 DIAGNOSIS — R262 Difficulty in walking, not elsewhere classified: Secondary | ICD-10-CM

## 2020-03-14 DIAGNOSIS — R2689 Other abnormalities of gait and mobility: Secondary | ICD-10-CM

## 2020-03-14 DIAGNOSIS — M25561 Pain in right knee: Secondary | ICD-10-CM

## 2020-03-14 NOTE — Therapy (Signed)
[place] [facility] [address] [place], [place], [address] Phone: [phone]   Fax:  [phone]  Physical Therapy Treatment  Patient Details  Name: Juan Hughes MRN: [id number] Date of Birth: [birth year] No data recorded  Encounter Date: 03/14/2020   PT End of Session - 03/14/20 1149    Visit Number 4    Number of Visits 17    Date for PT Re-Evaluation 04/29/20    PT Start Time 1115    PT Stop Time 1200    PT Time Calculation (min) 45 min    Equipment Utilized During Treatment Gait belt    Activity Tolerance Patient tolerated treatment well    Behavior During Therapy [facility] for tasks assessed/performed           Past Medical History:  Diagnosis Date   CHF (congestive heart failure) (HCC)    last ef 40-45% echo 05/2019   GERD (gastroesophageal reflux disease)    HFrEF (heart failure with reduced ejection fraction) (HCC)    Hypertension    Osteoarthritis of right knee    Stroke (HCC) 06/2019   left sided weakness    Past Surgical History:  Procedure Laterality Date   INNER EAR SURGERY     as teenager   TOTAL KNEE ARTHROPLASTY Right 02/24/2020   Procedure: RIGHT TOTAL KNEE ARTHROPLASTY;  Surgeon: Tarry Kos, MD;  Location: [facility];  Service: Orthopedics;  Laterality: Right;    There were no vitals filed for this visit.   Subjective Assessment - 03/14/20 1116    Subjective Reports he has been using his machine and completing HEP. reports 6/10 pain today    Pertinent History Pt is a 64 year old male familiar with this clinic presenting with R TKA 02/24/20. Patient being seen for stroke deficits with R sided weakness, deconditioning, postural dysfunction and pain prior to replacement, with hopes TKA would aid in ind function. Patient ambulates into clinic with RW, is using RW in home walking across his house, and using CPM 2 hours 4x/day. Has 5 stairs to enter/exit stairs and  uses rail with family member assisting him with RW. He is dressing with minA from his wife to aid with donning/doffing pants, wife assists him in lifting his RLE into bath where he has a seat he is able to bath himself, and wife applies R shoes. He has been completing ankle pumps and AAROM SLR with wife, but did not have HHPT, does not have a regular exercise regimen. Pt goal is to ambulate without AD and dance. Pain is achy, post surgical, reports following walking into clinic 20/10 (10/10), best pain 0/10 at rest with medication.    Limitations Lifting;House hold activities;Walking    How long can you sit comfortably? unlimited    How long can you stand comfortably? 5-91mins    How long can you walk comfortably? 1-67mins    Patient Stated Goals Ambulate without AD and dance    Currently in Pain? No/denies    Pain Onset 1 to 4 weeks ago           Ther-Ex AAROM matrix bike, no resistance seat 10 > seat 7 SLR x10; with 2# 2x 10 with min cuing to prevent quad lag with decent carry over OMEGA knee ext 5# with LLE assisting elevation RLE for lower OMEGA knee flex 15# x10 20# x10 Mini squat to elevated mat table with LLE on balance stone and TC at RLE for  increased RLE WB 3x 10; excellent carry over R SLS with bilat 2 finger support x30sec; unilateral 2 finger support 30sec  Manual Tib/fib distraction + movement into knee flex in sitting x15 3-5sec holds In heel prop post femur mobilization 6x 30sec   Following PROM 4-119d        PT Education - 03/14/20 1140    Education Details therex form/technique    Person(s) Educated Patient    Methods Explanation;Demonstration;Verbal cues    Comprehension Verbalized understanding;Returned demonstration;Verbal cues required            PT Short Term Goals - 03/03/20 1748      PT SHORT TERM GOAL #1   Title Pt will be independent with HEP in order to improve strength and balance in order to decrease fall risk and improve function at home  and work.    Baseline 03/03/20 HEP given    Time 4    Period Weeks    Status New      PT SHORT TERM GOAL #2   Title Pt will decrease 5TSTS by at least 3 seconds in order to demonstrate clinically significant improvement in LE strength    Baseline 03/03/20 40sec    Time 4    Period Weeks    Status New      PT SHORT TERM GOAL #3   Title Pt will increase to at least 0.27m/s in order to demonstrate normalized household ambulation with LRD to reduce fall risk.    Baseline 03/03/20 unable to complete distance    Time 4    Period Weeks    Status New             PT Long Term Goals - 03/03/20 1746      PT LONG TERM GOAL #1   Title Pt will increase to at least 1.3m/s in order to demonstrate normalized community ambulation with LRD to reduce fall risk.    Baseline 03/03/20 unable to complete distance, 0.87m/s at prior d/c    Time 8    Period Weeks    Status New      PT LONG TERM GOAL #2   Title Pt will decrease 5TSTS by to at least 12seconds in order to demonstrate normalized LE strength needed for safe, ind transfers    Baseline 03/03/20 40sec    Time 8    Period Weeks    Status New      PT LONG TERM GOAL #3   Title Patient will increase FOTO score to 57 to demonstrate predicted increase in functional mobility to complete ADLs    Baseline 03/03/20 30    Time 8    Period Weeks    Status New      PT LONG TERM GOAL #4   Title Pt will decrease worst pain as reported on NPRS by at least 3 points in order to demonstrate clinically significant reduction in pain.    Baseline 03/03/20 8/10    Time 8    Period Weeks    Status New                 Plan - 03/14/20 1159    Clinical Impression Statement PT continued therex progression for increased ROM and strength, use of SPC through clinic with success. Patient is able to comply with all cuing for proper technique of therex and demonstrates confedient use of SPC short distances in clinic. PT encouraged patient to  use [facility] for short distances. Patient motivated  throughout session with good carry over of cuing. PT will continue progression as able.    Personal Factors and Comorbidities Comorbidity 1;Comorbidity 2;Comorbidity 3+;Education    Comorbidities HTN, CHF, CVA Jan 2021    Examination-Activity Limitations Bathing;Lift;Dressing;Sit;Stand;Transfers    Examination-Participation Restrictions Cleaning;Community Activity;Yard Work;Driving    Stability/Clinical Decision Making Evolving/Moderate complexity    Clinical Decision Making Moderate    Rehab Potential Good    PT Frequency 2x / week    PT Duration 3 weeks    PT Treatment/Interventions ADLs/Self Care Home Management;Cryotherapy;Ultrasound;Gait training;Stair training;Therapeutic activities;Therapeutic exercise;Neuromuscular re-education;Patient/family education;Manual techniques;Orthotic Fit/Training;Dry needling;Joint Manipulations;Vestibular;Spinal Manipulations;Passive range of motion;Balance training;Functional mobility training;Moist Heat;Electrical Stimulation;DME Instruction    PT Next Visit Plan ROM/strength    PT Home Exercise Plan SLR, sidelying abd, prone hip ext, heel slides    Consulted and Agree with Plan of Care Patient           Patient will benefit from skilled therapeutic intervention in order to improve the following deficits and impairments:  Abnormal gait, Decreased activity tolerance, Decreased endurance, Decreased range of motion, Decreased strength, Increased fascial restricitons, Improper body mechanics, Impaired sensation, Pain, Postural dysfunction, Impaired tone, Impaired flexibility, Increased muscle spasms, Difficulty walking, Decreased safety awareness, Decreased mobility, Decreased coordination, Decreased balance, Impaired UE functional use  Visit Diagnosis: Acute pain of right knee  Other abnormalities of gait and mobility  Difficulty in walking, not elsewhere classified     Problem List Patient Active  Problem List   Diagnosis Date Noted   Status post total knee replacement, right 02/24/2020   Osteoarthritis of right knee 02/23/2020   Nonischemic cardiomyopathy (HCC)    HFrEF (heart failure with reduced ejection fraction) (HCC)    Stroke (HCC) 06/18/2019   Hypertension    Left-sided weakness    Hypokalemia    Tobacco abuse    Hilda Lias DPT \ Hilda Lias 03/14/2020, 12:02 PM  [place] [facility] [address], [place], [address] Phone: [phone]   Fax:  [phone]  Name: Juan Hughes MRN: [id number] Date of Birth: [birth year]

## 2020-03-17 ENCOUNTER — Encounter: Payer: Self-pay | Admitting: Physical Therapy

## 2020-03-17 ENCOUNTER — Other Ambulatory Visit: Payer: Self-pay

## 2020-03-17 ENCOUNTER — Ambulatory Visit: Payer: Self-pay | Admitting: Physical Therapy

## 2020-03-17 DIAGNOSIS — R262 Difficulty in walking, not elsewhere classified: Secondary | ICD-10-CM

## 2020-03-17 DIAGNOSIS — R2689 Other abnormalities of gait and mobility: Secondary | ICD-10-CM

## 2020-03-17 DIAGNOSIS — M25561 Pain in right knee: Secondary | ICD-10-CM

## 2020-03-17 NOTE — Therapy (Signed)
[place] [facility] PHYSICAL AND SPORTS MEDICINE [address], [place], [address] Phone: [phone]   Fax:  [phone]  Physical Therapy Treatment  Patient Details  Name: Juan Hughes MRN: [id number] Date of Birth: 25-Aug-1955 No data recorded  Encounter Date: 03/17/2020   PT End of Session - 03/17/20 1350    Visit Number 5    Number of Visits 17    Date for PT Re-Evaluation 04/29/20    PT Start Time 0147    PT Stop Time 0227    PT Time Calculation (min) 40 min    Equipment Utilized During Treatment Gait belt    Activity Tolerance Patient tolerated treatment well    Behavior During Therapy [facility] for tasks assessed/performed           Past Medical History:  Diagnosis Date  . CHF (congestive heart failure) (HCC)    last ef 40-45% echo 05/2019  . GERD (gastroesophageal reflux disease)   . HFrEF (heart failure with reduced ejection fraction) (HCC)   . Hypertension   . Osteoarthritis of right knee   . Stroke [facility]) 06/2019   left sided weakness    Past Surgical History:  Procedure Laterality Date  . INNER EAR SURGERY     as teenager  . TOTAL KNEE ARTHROPLASTY Right 02/24/2020   Procedure: RIGHT TOTAL KNEE ARTHROPLASTY;  Surgeon: Tarry Kos, MD;  Location: [facility];  Service: Orthopedics;  Laterality: Right;    There were no vitals filed for this visit.   Subjective Assessment - 03/17/20 1349    Subjective Patient reports he is continuing to make progress, has been using cane at home as able, reports 5/10 pain today. Has been using machine and completing HEP    Pertinent History Pt is a 64 year old male familiar with this clinic presenting with R TKA 02/24/20. Patient being seen for stroke deficits with R sided weakness, deconditioning, postural dysfunction and pain prior to replacement, with hopes TKA would aid in ind function. Patient ambulates into clinic with RW, is using RW in home walking across his  house, and using CPM 2 hours 4x/day. Has 5 stairs to enter/exit stairs and uses rail with family member assisting him with RW. He is dressing with minA from his wife to aid with donning/doffing pants, wife assists him in lifting his RLE into bath where he has a seat he is able to bath himself, and wife applies R shoes. He has been completing ankle pumps and AAROM SLR with wife, but did not have HHPT, does not have a regular exercise regimen. Pt goal is to ambulate without AD and dance. Pain is achy, post surgical, reports following walking into clinic 20/10 (10/10), best pain 0/10 at rest with medication.    Limitations Lifting;House hold activities;Walking    How long can you sit comfortably? unlimited    How long can you stand comfortably? 5-67mins    How long can you walk comfortably? 1-52mins    Patient Stated Goals Ambulate without AD and dance    Pain Onset 1 to 4 weeks ago             Ther-Ex AAROM matrix bike, no resistance seat 8 > seat 6 SLR c 3# 2x 10 with min cuing to prevent quad lag with decent carry over Mini squat to elevated mat table with LLE on balance stone and TC of blackTB at RLE for increased RLE WB 3x 10; excellent carry over Sidestepping  2x 10 R and L with CGA for safety, cuing for foot clearance without slide with good carry over OMEGA knee ext 5# 3x 10 RLE only  OMEGA knee flex 20# 3 x10 with min cuing for full ROM, good carry over  Manual Tib/fib distraction + movement into knee flex in sitting x15 3-5sec holds In heel prop post femur mobilization 6x 30sec          PT Education - 03/17/20 1349    Education Details therex form/technique    Person(s) Educated Patient    Methods Explanation;Demonstration;Verbal cues    Comprehension Verbalized understanding;Returned demonstration;Verbal cues required            PT Short Term Goals - 03/03/20 1748      PT SHORT TERM GOAL #1   Title Pt will be independent with HEP in order to improve strength and  balance in order to decrease fall risk and improve function at home and work.    Baseline 03/03/20 HEP given    Time 4    Period Weeks    Status New      PT SHORT TERM GOAL #2   Title Pt will decrease 5TSTS by at least 3 seconds in order to demonstrate clinically significant improvement in LE strength    Baseline 03/03/20 40sec    Time 4    Period Weeks    Status New      PT SHORT TERM GOAL #3   Title Pt will increase to at least 0.40m/s in order to demonstrate normalized household ambulation with LRD to reduce fall risk.    Baseline 03/03/20 unable to complete distance    Time 4    Period Weeks    Status New             PT Long Term Goals - 03/03/20 1746      PT LONG TERM GOAL #1   Title Pt will increase to at least 1.45m/s in order to demonstrate normalized community ambulation with LRD to reduce fall risk.    Baseline 03/03/20 unable to complete distance, 0.49m/s at prior d/c    Time 8    Period Weeks    Status New      PT LONG TERM GOAL #2   Title Pt will decrease 5TSTS by to at least 12seconds in order to demonstrate normalized LE strength needed for safe, ind transfers    Baseline 03/03/20 40sec    Time 8    Period Weeks    Status New      PT LONG TERM GOAL #3   Title Patient will increase FOTO score to 57 to demonstrate predicted increase in functional mobility to complete ADLs    Baseline 03/03/20 30    Time 8    Period Weeks    Status New      PT LONG TERM GOAL #4   Title Pt will decrease worst pain as reported on NPRS by at least 3 points in order to demonstrate clinically significant reduction in pain.    Baseline 03/03/20 8/10    Time 8    Period Weeks    Status New                 Plan - 03/17/20 1404    Clinical Impression Statement PT continued therex progression for increased R knee ROM, strength and functional mobility with continued use of SPC through clinic. Patient is able to comply with all cuing for proper technique of  therex and demonstrates good use of SPC. Patient is motivated throughout session with no increased pain. PT will continue progression as able.    Personal Factors and Comorbidities Comorbidity 1;Comorbidity 2;Comorbidity 3+;Education    Comorbidities HTN, CHF, CVA Jan 2021    Examination-Activity Limitations Bathing;Lift;Dressing;Sit;Stand;Transfers    Examination-Participation Restrictions Cleaning;Community Activity;Yard Work;Driving    Stability/Clinical Decision Making Evolving/Moderate complexity    Clinical Decision Making Moderate    Rehab Potential Good    PT Frequency 2x / week    PT Duration 3 weeks    PT Treatment/Interventions ADLs/Self Care Home Management;Cryotherapy;Ultrasound;Gait training;Stair training;Therapeutic activities;Therapeutic exercise;Neuromuscular re-education;Patient/family education;Manual techniques;Orthotic Fit/Training;Dry needling;Joint Manipulations;Vestibular;Spinal Manipulations;Passive range of motion;Balance training;Functional mobility training;Moist Heat;Electrical Stimulation;DME Instruction    PT Next Visit Plan ROM/strength    PT Home Exercise Plan SLR, sidelying abd, prone hip ext, heel slides    Consulted and Agree with Plan of Care Patient           Patient will benefit from skilled therapeutic intervention in order to improve the following deficits and impairments:  Abnormal gait, Decreased activity tolerance, Decreased endurance, Decreased range of motion, Decreased strength, Increased fascial restricitons, Improper body mechanics, Impaired sensation, Pain, Postural dysfunction, Impaired tone, Impaired flexibility, Increased muscle spasms, Difficulty walking, Decreased safety awareness, Decreased mobility, Decreased coordination, Decreased balance, Impaired UE functional use  Visit Diagnosis: Acute pain of right knee  Other abnormalities of gait and mobility  Difficulty in walking, not elsewhere classified     Problem List Patient  Active Problem List   Diagnosis Date Noted  . 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 Osteoarthritis of right knee 02/23/2020  . Nonischemic cardiomyopathy (HCC)   . HFrEF (heart failure with reduced ejection fraction) (HCC)   . Stroke (HCC) 06/18/2019  . Hypertension   . Left-sided weakness   . Hypokalemia   . Tobacco abuse    Hilda Lias DPT Hilda Lias 03/17/2020, 2:23 PM  [place] [facility] PHYSICAL AND SPORTS MEDICINE [address], [place], [address] Phone: [phone]   Fax:  [phone]  Name: Quincey Quesinberry MRN: [id number] Date of Birth: Feb 11, 1956

## 2020-03-23 ENCOUNTER — Telehealth: Payer: Self-pay | Admitting: Orthopaedic Surgery

## 2020-03-23 NOTE — Telephone Encounter (Signed)
Patient wife Juan Hughes called requesting oxycodone. Please send to pharmacy on file. Please send to pharmacy on file. Patient phone number is [phone].

## 2020-03-23 NOTE — Telephone Encounter (Signed)
Please advise 

## 2020-03-24 ENCOUNTER — Other Ambulatory Visit: Payer: Self-pay | Admitting: Physician Assistant

## 2020-03-24 ENCOUNTER — Other Ambulatory Visit: Payer: Self-pay

## 2020-03-24 ENCOUNTER — Encounter: Payer: Self-pay | Admitting: Physical Therapy

## 2020-03-24 ENCOUNTER — Ambulatory Visit: Payer: Self-pay | Attending: Physician Assistant | Admitting: Physical Therapy

## 2020-03-24 DIAGNOSIS — M25562 Pain in left knee: Secondary | ICD-10-CM | POA: Insufficient documentation

## 2020-03-24 DIAGNOSIS — M25561 Pain in right knee: Secondary | ICD-10-CM | POA: Insufficient documentation

## 2020-03-24 DIAGNOSIS — R262 Difficulty in walking, not elsewhere classified: Secondary | ICD-10-CM | POA: Insufficient documentation

## 2020-03-24 DIAGNOSIS — R2689 Other abnormalities of gait and mobility: Secondary | ICD-10-CM | POA: Insufficient documentation

## 2020-03-24 DIAGNOSIS — G8929 Other chronic pain: Secondary | ICD-10-CM | POA: Insufficient documentation

## 2020-03-24 MED ORDER — HYDROCODONE-ACETAMINOPHEN 5-325 MG PO TABS: 1.0000 | ORAL_TABLET | Freq: Two times a day (BID) | ORAL | 0 refills | Status: DC | PRN

## 2020-03-24 NOTE — Therapy (Signed)
[place] [facility] PHYS[facility] SPORTS MEDICINE [address], [place], [address] Phone: [phone]   Fax:  [phone]  Physical Therapy Treatment  Patient Details  Name: Juan Hughes MRN: [id number] Date of Birth: Aug 14, 1955 No data recorded  Encounter Date: 03/24/2020   PT End of Session - 03/24/20 0939    Visit Number 6    Number of Visits 17    Date for PT Re-Evaluation 04/29/20    PT Start Time 0858    PT Stop Time 0939    PT Time Calculation (min) 41 min    Equipment Utilized During Treatment Gait belt    Activity Tolerance Patient tolerated treatment well    Behavior During Therapy [facility] for tasks assessed/performed           Past Medical History:  Diagnosis Date  . CHF (congestive heart failure) (HCC)    last ef 40-45% echo 05/2019  . GERD (gastroesophageal reflux disease)   . HFrEF (heart failure with reduced ejection fraction) (HCC)   . Hypertension   . Osteoarthritis of right knee   . Stroke [facility]) 06/2019   left sided weakness    Past Surgical History:  Procedure Laterality Date  . INNER EAR SURGERY     as teenager  . TOTAL KNEE ARTHROPLASTY Right 02/24/2020   Procedure: RIGHT TOTAL KNEE ARTHROPLASTY;  Surgeon: Tarry Kos, MD;  Location: [facility];  Service: Orthopedics;  Laterality: Right;    There were no vitals filed for this visit.   Subjective Assessment - 03/24/20 0901    Subjective Pt reports 0 pain at rest and 4 with activity, he has been using both the RW and the cane at home.    Pertinent History Pt is a 64 year old male familiar with this clinic presenting with R TKA 02/24/20. Patient being seen for stroke deficits with R sided weakness, deconditioning, postural dysfunction and pain prior to replacement, with hopes TKA would aid in ind function. Patient ambulates into clinic with RW, is using RW in home walking across his house, and using CPM 2 hours 4x/day. Has 5 stairs to  enter/exit stairs and uses rail with family member assisting him with RW. He is dressing with minA from his wife to aid with donning/doffing pants, wife assists him in lifting his RLE into bath where he has a seat he is able to bath himself, and wife applies R shoes. He has been completing ankle pumps and AAROM SLR with wife, but did not have HHPT, does not have a regular exercise regimen. Pt goal is to ambulate without AD and dance. Pain is achy, post surgical, reports following walking into clinic 20/10 (10/10), best pain 0/10 at rest with medication.    Limitations Lifting;House hold activities;Walking    How long can you sit comfortably? unlimited    How long can you stand comfortably? 5-41mins    How long can you walk comfortably? 1-91mins    Patient Stated Goals Ambulate without AD and dance    Currently in Pain? No/denies    Pain Onset 1 to 4 weeks ago           Ther-Ex AAROM matrix bike, no resistance seat 6 4 mins   Mini squat to elevated mat table with LLE on balance stone and TC of blackTB at RLE for increased RLE WB 3x 10; excellent carry over  Sidestepping 3x10 feet R and L with CGA for safety, cuing for foot clearance without  slide with good carry over  OMEGA knee ext 5# 3x 10 RLE only  OMEGA knee flex 20# 3 x10 with min cuing for full ROM, good carry over   TKE with band if he has limited extension   Matrix hip abduction 10 lbs 2x10   Gait Training  Cane walking 3 laps, increased difficulty by lap 3                            PT Education - 03/24/20 0915    Education Details therex form/technique    Person(s) Educated Patient    Methods Explanation;Demonstration;Verbal cues    Comprehension Verbalized understanding;Returned demonstration;Verbal cues required            PT Short Term Goals - 03/03/20 1748      PT SHORT TERM GOAL #1   Title Pt will be independent with HEP in order to improve strength and balance in order to decrease fall  risk and improve function at home and work.    Baseline 03/03/20 HEP given    Time 4    Period Weeks    Status New      PT SHORT TERM GOAL #2   Title Pt will decrease 5TSTS by at least 3 seconds in order to demonstrate clinically significant improvement in LE strength    Baseline 03/03/20 40sec    Time 4    Period Weeks    Status New      PT SHORT TERM GOAL #3   Title Pt will increase to at least 0.58m/s in order to demonstrate normalized household ambulation with LRD to reduce fall risk.    Baseline 03/03/20 unable to complete distance    Time 4    Period Weeks    Status New             PT Long Term Goals - 03/03/20 1746      PT LONG TERM GOAL #1   Title Pt will increase to at least 1.18m/s in order to demonstrate normalized community ambulation with LRD to reduce fall risk.    Baseline 03/03/20 unable to complete distance, 0.2m/s at prior d/c    Time 8    Period Weeks    Status New      PT LONG TERM GOAL #2   Title Pt will decrease 5TSTS by to at least 12seconds in order to demonstrate normalized LE strength needed for safe, ind transfers    Baseline 03/03/20 40sec    Time 8    Period Weeks    Status New      PT LONG TERM GOAL #3   Title Patient will increase FOTO score to 57 to demonstrate predicted increase in functional mobility to complete ADLs    Baseline 03/03/20 30    Time 8    Period Weeks    Status New      PT LONG TERM GOAL #4   Title Pt will decrease worst pain as reported on NPRS by at least 3 points in order to demonstrate clinically significant reduction in pain.    Baseline 03/03/20 8/10    Time 8    Period Weeks    Status New                 Plan - 03/24/20 0940    Clinical Impression Statement Pt continued therex program to focus on R knee ROM and strength. SPC used throughout the clinic  to improve walking endurance without RW. Pt is showing good improvements in R LE strength and is able to follow all therex exercises with  proper cueing and form. PT will progress as able.    Personal Factors and Comorbidities Comorbidity 1;Comorbidity 2;Comorbidity 3+;Education    Comorbidities HTN, CHF, CVA Jan 2021    Examination-Activity Limitations Bathing;Lift;Dressing;Sit;Stand;Transfers    Examination-Participation Restrictions Cleaning;Community Activity;Yard Work;Driving    Stability/Clinical Decision Making Evolving/Moderate complexity    Clinical Decision Making Moderate    Rehab Potential Good    PT Frequency 2x / week    PT Duration 3 weeks    PT Treatment/Interventions ADLs/Self Care Home Management;Cryotherapy;Ultrasound;Gait training;Stair training;Therapeutic activities;Therapeutic exercise;Neuromuscular re-education;Patient/family education;Manual techniques;Orthotic Fit/Training;Dry needling;Joint Manipulations;Vestibular;Spinal Manipulations;Passive range of motion;Balance training;Functional mobility training;Moist Heat;Electrical Stimulation;DME Instruction    PT Next Visit Plan ROM/strength    PT Home Exercise Plan SLR, sidelying abd, prone hip ext, heel slides    Consulted and Agree with Plan of Care Patient           Patient will benefit from skilled therapeutic intervention in order to improve the following deficits and impairments:  Abnormal gait, Decreased activity tolerance, Decreased endurance, Decreased range of motion, Decreased strength, Increased fascial restricitons, Improper body mechanics, Impaired sensation, Pain, Postural dysfunction, Impaired tone, Impaired flexibility, Increased muscle spasms, Difficulty walking, Decreased safety awareness, Decreased mobility, Decreased coordination, Decreased balance, Impaired UE functional use  Visit Diagnosis: Acute pain of right knee  Other abnormalities of gait and mobility  Difficulty in walking, not elsewhere classified  Chronic pain of left knee  Chronic pain of right knee     Problem List Patient Active Problem List   Diagnosis Date  Noted  . 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 Osteoarthritis of right knee 02/23/2020  . Nonischemic cardiomyopathy (HCC)   . HFrEF (heart failure with reduced ejection fraction) (HCC)   . Stroke (HCC) 06/18/2019  . Hypertension   . Left-sided weakness   . Hypokalemia   . Tobacco abuse    Hilda Lias DPT [place], SPT  Hilda Lias 03/24/2020, 12:52 PM  [place] [facility] PHYSICAL AND SPORTS MEDICINE [address] [place], [address] Phone: [phone]   Fax:  [phone]  Name: Juan Hughes MRN: [id number] Date of Birth: [birth year]

## 2020-03-24 NOTE — Telephone Encounter (Signed)
Weaning to norco and I just sent in

## 2020-03-29 ENCOUNTER — Encounter: Payer: Self-pay | Admitting: Physical Therapy

## 2020-03-29 ENCOUNTER — Ambulatory Visit: Payer: Self-pay | Admitting: Physical Therapy

## 2020-03-29 ENCOUNTER — Other Ambulatory Visit: Payer: Self-pay

## 2020-03-29 DIAGNOSIS — G8929 Other chronic pain: Secondary | ICD-10-CM

## 2020-03-29 DIAGNOSIS — R262 Difficulty in walking, not elsewhere classified: Secondary | ICD-10-CM

## 2020-03-29 DIAGNOSIS — M25562 Pain in left knee: Secondary | ICD-10-CM

## 2020-03-29 DIAGNOSIS — R2689 Other abnormalities of gait and mobility: Secondary | ICD-10-CM

## 2020-03-29 DIAGNOSIS — M25561 Pain in right knee: Secondary | ICD-10-CM

## 2020-03-29 NOTE — Therapy (Signed)
[place] [facility] [address], [place], [address] Phone: [phone]   Fax:  [phone]  Physical Therapy Treatment  Patient Details  Name: Juan Hughes MRN: [id number] Date of Birth: Nov 25, 1955 No data recorded  Encounter Date: 03/29/2020   PT End of Session - 03/29/20 1026    Visit Number 7    Number of Visits 17    Date for PT Re-Evaluation 04/29/20    PT Start Time 0900    PT Stop Time 0943    PT Time Calculation (min) 43 min    Equipment Utilized During Treatment Gait belt    Activity Tolerance Patient tolerated treatment well    Behavior During Therapy [facility] for tasks assessed/performed           Past Medical History:  Diagnosis Date  . CHF (congestive heart failure) (HCC)    last ef 40-45% echo 05/2019  . GERD (gastroesophageal reflux disease)   . HFrEF (heart failure with reduced ejection fraction) (HCC)   . Hypertension   . Osteoarthritis of right knee   . Stroke [facility]) 06/2019   left sided weakness    Past Surgical History:  Procedure Laterality Date  . INNER EAR SURGERY     as teenager  . TOTAL KNEE ARTHROPLASTY Right 02/24/2020   Procedure: RIGHT TOTAL KNEE ARTHROPLASTY;  Surgeon: Tarry Kos, MD;  Location: [facility];  Service: Orthopedics;  Laterality: Right;    There were no vitals filed for this visit.   Subjective Assessment - 03/29/20 0901    Subjective Pt ambulates into clinic with a SPC for the first time and reports 3/10 pain with activity. Pt reports he in only using the [facility] to ambulate but is still using the RW when walking longer distance such as in the grocery store.    Pertinent History Pt is a 64 year old male familiar with this clinic presenting with R TKA 02/24/20. Patient being seen for stroke deficits with R sided weakness, deconditioning, postural dysfunction and pain prior to replacement, with hopes TKA would aid in ind function. Patient  ambulates into clinic with RW, is using RW in home walking across his house, and using CPM 2 hours 4x/day. Has 5 stairs to enter/exit stairs and uses rail with family member assisting him with RW. He is dressing with minA from his wife to aid with donning/doffing pants, wife assists him in lifting his RLE into bath where he has a seat he is able to bath himself, and wife applies R shoes. He has been completing ankle pumps and AAROM SLR with wife, but did not have HHPT, does not have a regular exercise regimen. Pt goal is to ambulate without AD and dance. Pain is achy, post surgical, reports following walking into clinic 20/10 (10/10), best pain 0/10 at rest with medication.    Limitations Lifting;House hold activities;Walking    How long can you sit comfortably? unlimited    How long can you stand comfortably? 5-95mins    How long can you walk comfortably? 1-71mins    Patient Stated Goals Ambulate without AD and dance    Currently in Pain? No/denies    Pain Onset 1 to 4 weeks ago          Ther-Ex AAROM matrix bike, no resistance seat 6 5 mins   SLS 2x30 sec BLE   Lunges 2x10 with cues to keep trunk upright and feet pointed forward   TKE with  GTB 2x10  Matrix hip abduction 10 lbs 2x10 w/RLE, 1x10 and 1x25 w/LLE  Gait Training  Cane walking 3 laps stepping over hurdles, increased difficulty by lap 3    Stairs 3x ascending and descending with cues to move cane and affected leg at the same time                PT Education - 03/29/20 0946    Education Details therex form/technique    Person(s) Educated Patient    Methods Explanation;Demonstration;Verbal cues    Comprehension Verbalized understanding;Returned demonstration;Verbal cues required            PT Short Term Goals - 03/03/20 1748      PT SHORT TERM GOAL #1   Title Pt will be independent with HEP in order to improve strength and balance in order to decrease fall risk and improve function at home and work.     Baseline 03/03/20 HEP given    Time 4    Period Weeks    Status New      PT SHORT TERM GOAL #2   Title Pt will decrease 5TSTS by at least 3 seconds in order to demonstrate clinically significant improvement in LE strength    Baseline 03/03/20 40sec    Time 4    Period Weeks    Status New      PT SHORT TERM GOAL #3   Title Pt will increase to at least 0.55m/s in order to demonstrate normalized household ambulation with LRD to reduce fall risk.    Baseline 03/03/20 unable to complete distance    Time 4    Period Weeks    Status New             PT Long Term Goals - 03/03/20 1746      PT LONG TERM GOAL #1   Title Pt will increase to at least 1.74m/s in order to demonstrate normalized community ambulation with LRD to reduce fall risk.    Baseline 03/03/20 unable to complete distance, 0.108m/s at prior d/c    Time 8    Period Weeks    Status New      PT LONG TERM GOAL #2   Title Pt will decrease 5TSTS by to at least 12seconds in order to demonstrate normalized LE strength needed for safe, ind transfers    Baseline 03/03/20 40sec    Time 8    Period Weeks    Status New      PT LONG TERM GOAL #3   Title Patient will increase FOTO score to 57 to demonstrate predicted increase in functional mobility to complete ADLs    Baseline 03/03/20 30    Time 8    Period Weeks    Status New      PT LONG TERM GOAL #4   Title Pt will decrease worst pain as reported on NPRS by at least 3 points in order to demonstrate clinically significant reduction in pain.    Baseline 03/03/20 8/10    Time 8    Period Weeks    Status New                 Plan - 03/29/20 1027    Clinical Impression Statement Patient is able to walk into clinic today with a single point cane instead of RW, showing good improvements in gait pattern and BLE strength. Therex program focused on single leg exercises and functional activities including stair climbing, stepping over obstacles,  and lunges. Pt  was able to tolerate new exercises with proper cueing and form without an increase in pain. Pt continues to be motivated to return to being independent with ADLs and dancing. PT will progress as able/    Personal Factors and Comorbidities Comorbidity 1;Comorbidity 2;Comorbidity 3+;Education    Comorbidities HTN, CHF, CVA Jan 2021    Examination-Activity Limitations Bathing;Lift;Dressing;Sit;Stand;Transfers    Examination-Participation Restrictions Cleaning;Community Activity;Yard Work;Driving    Stability/Clinical Decision Making Evolving/Moderate complexity    Clinical Decision Making Moderate    Rehab Potential Good    PT Frequency 2x / week    PT Duration 3 weeks    PT Treatment/Interventions ADLs/Self Care Home Management;Cryotherapy;Ultrasound;Gait training;Stair training;Therapeutic activities;Therapeutic exercise;Neuromuscular re-education;Patient/family education;Manual techniques;Orthotic Fit/Training;Dry needling;Joint Manipulations;Vestibular;Spinal Manipulations;Passive range of motion;Balance training;Functional mobility training;Moist Heat;Electrical Stimulation;DME Instruction    PT Next Visit Plan ROM/strength    PT Home Exercise Plan SLR, sidelying abd, prone hip ext, heel slides    Consulted and Agree with Plan of Care Patient           Patient will benefit from skilled therapeutic intervention in order to improve the following deficits and impairments:  Abnormal gait, Decreased activity tolerance, Decreased endurance, Decreased range of motion, Decreased strength, Increased fascial restricitons, Improper body mechanics, Impaired sensation, Pain, Postural dysfunction, Impaired tone, Impaired flexibility, Increased muscle spasms, Difficulty walking, Decreased safety awareness, Decreased mobility, Decreased coordination, Decreased balance, Impaired UE functional use  Visit Diagnosis: Chronic pain of right knee  Acute pain of right knee  Other abnormalities of gait and  mobility  Difficulty in walking, not elsewhere classified  Chronic pain of left knee     Problem List Patient Active Problem List   Diagnosis Date Noted  . 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 Osteoarthritis of right knee 02/23/2020  . Nonischemic cardiomyopathy (HCC)   . HFrEF (heart failure with reduced ejection fraction) (HCC)   . Stroke (HCC) 06/18/2019  . Hypertension   . Left-sided weakness   . Hypokalemia   . Tobacco abuse     Hilda Lias DPT [place], SPT Hilda Lias 03/29/2020, 12:50 PM  [place] [facility] PHYSICAL AND SPORTS MEDICINE [address], [place], [address] Phone: [phone]   Fax:  [phone]  Name: Patrick Salemi MRN: [id number] Date of Birth: 08-Aug-1955

## 2020-03-31 ENCOUNTER — Other Ambulatory Visit: Payer: Self-pay

## 2020-03-31 ENCOUNTER — Ambulatory Visit: Payer: Self-pay | Admitting: Physical Therapy

## 2020-03-31 ENCOUNTER — Encounter: Payer: Self-pay | Admitting: Physical Therapy

## 2020-03-31 DIAGNOSIS — M25561 Pain in right knee: Secondary | ICD-10-CM

## 2020-03-31 DIAGNOSIS — G8929 Other chronic pain: Secondary | ICD-10-CM

## 2020-03-31 DIAGNOSIS — M25562 Pain in left knee: Secondary | ICD-10-CM

## 2020-03-31 NOTE — Therapy (Signed)
[place] [facility]TS [facility] [address] [place], [address] Phone: [phone]   Fax:  [phone]  Physical Therapy Treatment  Patient Details  Name: Juan Hughes MRN: [id number] Date of Birth: April 29, 1956 No data recorded  Encounter Date: 03/31/2020   PT End of Session - 03/31/20 0941    Visit Number 8    Number of Visits 17    Date for PT Re-Evaluation 04/29/20    PT Start Time 0900    PT Stop Time 0941    PT Time Calculation (min) 41 min    Equipment Utilized During Treatment Gait belt    Activity Tolerance Patient tolerated treatment well    Behavior During Therapy [facility] for tasks assessed/performed           Past Medical History:  Diagnosis Date  . CHF (congestive heart failure) (HCC)    last ef 40-45% echo 05/2019  . GERD (gastroesophageal reflux disease)   . HFrEF (heart failure with reduced ejection fraction) (HCC)   . Hypertension   . Osteoarthritis of right knee   . Stroke [facility]) 06/2019   left sided weakness    Past Surgical History:  Procedure Laterality Date  . INNER EAR SURGERY     as teenager  . TOTAL KNEE ARTHROPLASTY Right 02/24/2020   Procedure: RIGHT TOTAL KNEE ARTHROPLASTY;  Surgeon: Tarry Kos, MD;  Location: [facility];  Service: Orthopedics;  Laterality: Right;    There were no vitals filed for this visit.   Subjective Assessment - 03/31/20 0903    Subjective Pt states he continues to have little pain in his R knee but reports he no longer needs to use the RW and only uses a SPC to ambulate.    Pertinent History Pt is a 64 year old male familiar with this clinic presenting with R TKA 02/24/20. Patient being seen for stroke deficits with R sided weakness, deconditioning, postural dysfunction and pain prior to replacement, with hopes TKA would aid in ind function. Patient ambulates into clinic with RW, is using RW in home walking across his house, and using CPM 2  hours 4x/day. Has 5 stairs to enter/exit stairs and uses rail with family member assisting him with RW. He is dressing with minA from his wife to aid with donning/doffing pants, wife assists him in lifting his RLE into bath where he has a seat he is able to bath himself, and wife applies R shoes. He has been completing ankle pumps and AAROM SLR with wife, but did not have HHPT, does not have a regular exercise regimen. Pt goal is to ambulate without AD and dance. Pain is achy, post surgical, reports following walking into clinic 20/10 (10/10), best pain 0/10 at rest with medication.    Limitations Lifting;House hold activities;Walking    How long can you sit comfortably? unlimited    How long can you stand comfortably? 5-6mins    How long can you walk comfortably? 1-61mins    Patient Stated Goals Ambulate without AD and dance    Currently in Pain? Yes    Pain Score 4     Pain Location Knee    Pain Onset 1 to 4 weeks ago            Therex  AAROM matrix bike, no resistance seat 6 5 mins      Lunges 2x10 with cues to keep trunk upright and feet pointed forward     Matrix  hip abduction 25# 2x10 on R and LLE, easier when WBing on RLE, Extension 25# 2x10 pushing into therapist had to keep trunk upright   Step ups on 6in step 1x10, 2x30secs with cues to increase speed    Gait Training  Cane walking 3x9ft   Side stepping over hurdles 2x45ft                          PT Education - 03/31/20 1006    Education Details therex form/technique    Methods Explanation;Demonstration;Tactile cues;Verbal cues    Comprehension Verbalized understanding;Tactile cues required;Returned demonstration;Verbal cues required            PT Short Term Goals - 03/03/20 1748      PT SHORT TERM GOAL #1   Title Pt will be independent with HEP in order to improve strength and balance in order to decrease fall risk and improve function at home and work.    Baseline 03/03/20 HEP given     Time 4    Period Weeks    Status New      PT SHORT TERM GOAL #2   Title Pt will decrease 5TSTS by at least 3 seconds in order to demonstrate clinically significant improvement in LE strength    Baseline 03/03/20 40sec    Time 4    Period Weeks    Status New      PT SHORT TERM GOAL #3   Title Pt will increase to at least 0.64m/s in order to demonstrate normalized household ambulation with LRD to reduce fall risk.    Baseline 03/03/20 unable to complete distance    Time 4    Period Weeks    Status New             PT Long Term Goals - 03/03/20 1746      PT LONG TERM GOAL #1   Title Pt will increase to at least 1.20m/s in order to demonstrate normalized community ambulation with LRD to reduce fall risk.    Baseline 03/03/20 unable to complete distance, 0.67m/s at prior d/c    Time 8    Period Weeks    Status New      PT LONG TERM GOAL #2   Title Pt will decrease 5TSTS by to at least 12seconds in order to demonstrate normalized LE strength needed for safe, ind transfers    Baseline 03/03/20 40sec    Time 8    Period Weeks    Status New      PT LONG TERM GOAL #3   Title Patient will increase FOTO score to 57 to demonstrate predicted increase in functional mobility to complete ADLs    Baseline 03/03/20 30    Time 8    Period Weeks    Status New      PT LONG TERM GOAL #4   Title Pt will decrease worst pain as reported on NPRS by at least 3 points in order to demonstrate clinically significant reduction in pain.    Baseline 03/03/20 8/10    Time 8    Period Weeks    Status New                 Plan - 03/31/20 0941    Clinical Impression Statement Pt continues to show improvements with RLE strength and gait pattern. Pt able to perform single leg hip abduction and extension exercise with higher weight and tactile cues for proper  form. Pt able to increase single leg standing tolerance and balance on RLE. PT will continue to progress as able to meet  patient's goals.    Personal Factors and Comorbidities Comorbidity 1;Comorbidity 2;Comorbidity 3+;Education    Comorbidities HTN, CHF, CVA Jan 2021    Examination-Activity Limitations Bathing;Lift;Dressing;Sit;Stand;Transfers    Stability/Clinical Decision Making Evolving/Moderate complexity    Clinical Decision Making Moderate    Rehab Potential Good    PT Frequency 2x / week    PT Duration 3 weeks    PT Treatment/Interventions ADLs/Self Care Home Management;Cryotherapy;Ultrasound;Gait training;Stair training;Therapeutic activities;Therapeutic exercise;Neuromuscular re-education;Patient/family education;Manual techniques;Orthotic Fit/Training;Dry needling;Joint Manipulations;Vestibular;Spinal Manipulations;Passive range of motion;Balance training;Functional mobility training;Moist Heat;Electrical Stimulation;DME Instruction    PT Next Visit Plan ROM/strength    PT Home Exercise Plan SLR, sidelying abd, prone hip ext, heel slides    Consulted and Agree with Plan of Care Patient           Patient will benefit from skilled therapeutic intervention in order to improve the following deficits and impairments:  Abnormal gait, Decreased activity tolerance, Decreased endurance, Decreased range of motion, Decreased strength, Increased fascial restricitons, Improper body mechanics, Impaired sensation, Pain, Postural dysfunction, Impaired tone, Impaired flexibility, Increased muscle spasms, Difficulty walking, Decreased safety awareness, Decreased mobility, Decreased coordination, Decreased balance, Impaired UE functional use  Visit Diagnosis: Chronic pain of right knee  Chronic pain of left knee  Acute pain of right knee     Problem List Patient Active Problem List   Diagnosis Date Noted  . 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 Osteoarthritis of right knee 02/23/2020  . Nonischemic cardiomyopathy (HCC)   . HFrEF (heart failure with reduced ejection fraction) (HCC)   .  Stroke (HCC) 06/18/2019  . Hypertension   . Left-sided weakness   . Hypokalemia   . Tobacco abuse     Hilda Lias DPT [place], SPT  Hilda Lias 03/31/2020, 12:52 PM  [place] [facility] PHYSICAL AND SPORTS MEDICINE [address], [place], [address] Phone: [phone]   Fax:  [phone]  Name: Juan Hughes MRN: [id number] Date of Birth: 13-Nov-1955

## 2020-04-04 ENCOUNTER — Other Ambulatory Visit: Payer: Self-pay

## 2020-04-04 ENCOUNTER — Ambulatory Visit: Payer: Self-pay | Admitting: Physical Therapy

## 2020-04-04 ENCOUNTER — Encounter: Payer: Self-pay | Admitting: Physical Therapy

## 2020-04-04 DIAGNOSIS — M25561 Pain in right knee: Secondary | ICD-10-CM

## 2020-04-04 DIAGNOSIS — M25562 Pain in left knee: Secondary | ICD-10-CM

## 2020-04-04 DIAGNOSIS — G8929 Other chronic pain: Secondary | ICD-10-CM

## 2020-04-04 NOTE — Therapy (Signed)
[facility] PHYS[facility] AND SPORTS MEDICINE [address] [place], [address] Phone: [phone]   Fax:  [phone]  Physical Therapy Treatment  Patient Details  Name: Juan Hughes MRN: [id number] Date of Birth: 1955/10/08 No data recorded  Encounter Date: 04/04/2020   PT End of Session - 04/04/20 0945    Visit Number 9    Number of Visits 17    Date for PT Re-Evaluation 04/29/20    PT Start Time 0900    PT Stop Time 0942    PT Time Calculation (min) 42 min    Activity Tolerance Patient tolerated treatment well    Behavior During Therapy [facility] for tasks assessed/performed           Past Medical History:  Diagnosis Date  . CHF (congestive heart failure) (HCC)    last ef 40-45% echo 05/2019  . GERD (gastroesophageal reflux disease)   . HFrEF (heart failure with reduced ejection fraction) (HCC)   . Hypertension   . Osteoarthritis of right knee   . Stroke [facility]) 06/2019   left sided weakness    Past Surgical History:  Procedure Laterality Date  . INNER EAR SURGERY     as teenager  . TOTAL KNEE ARTHROPLASTY Right 02/24/2020   Procedure: RIGHT TOTAL KNEE ARTHROPLASTY;  Surgeon: Tarry Kos, MD;  Location: [facility];  Service: Orthopedics;  Laterality: Right;    There were no vitals filed for this visit.   Subjective Assessment - 04/04/20 0904    Subjective Pt reports he continues to see progress in his R knee and is able to bend it more. Pt reports he still has some pain mostly in the medial part of his knee.    Pertinent History Pt is a 64 year old male familiar with this clinic presenting with R TKA 02/24/20. Patient being seen for stroke deficits with R sided weakness, deconditioning, postural dysfunction and pain prior to replacement, with hopes TKA would aid in ind function. Patient ambulates into clinic with RW, is using RW in home walking across his house, and using CPM 2 hours 4x/day. Has 5 stairs to  enter/exit stairs and uses rail with family member assisting him with RW. He is dressing with minA from his wife to aid with donning/doffing pants, wife assists him in lifting his RLE into bath where he has a seat he is able to bath himself, and wife applies R shoes. He has been completing ankle pumps and AAROM SLR with wife, but did not have HHPT, does not have a regular exercise regimen. Pt goal is to ambulate without AD and dance. Pain is achy, post surgical, reports following walking into clinic 20/10 (10/10), best pain 0/10 at rest with medication.    Limitations Lifting;House hold activities;Walking    How long can you sit comfortably? unlimited    How long can you stand comfortably? 5-55mins    How long can you walk comfortably? 1-11mins    Patient Stated Goals Ambulate without AD and dance    Currently in Pain? Yes    Pain Score 6     Pain Location Knee    Pain Orientation Medial    Pain Onset 1 to 4 weeks ago          Ther-Ex AAROM matrix bike, no resistance seat   Lunges2x10 with TKE RTB with cane on R side and cues to   Matrix hip abduction 25# 2x10on R and LLE Extension 10#  2x10 with cues to stand tall and upright  Matrix knee extension 3x12 10#   Lateral step ups 3inch step, stepping on and off with R LE   Step ups 2x12,with cues for increased speed                          PT Education - 04/04/20 0945    Education Details therex form/technique    Person(s) Educated Patient    Methods Explanation;Demonstration;Verbal cues;Tactile cues    Comprehension Verbalized understanding;Returned demonstration;Verbal cues required;Tactile cues required            PT Short Term Goals - 03/03/20 1748      PT SHORT TERM GOAL #1   Title Pt will be independent with HEP in order to improve strength and balance in order to decrease fall risk and improve function at home and work.    Baseline 03/03/20 HEP given    Time 4    Period Weeks     Status New      PT SHORT TERM GOAL #2   Title Pt will decrease 5TSTS by at least 3 seconds in order to demonstrate clinically significant improvement in LE strength    Baseline 03/03/20 40sec    Time 4    Period Weeks    Status New      PT SHORT TERM GOAL #3   Title Pt will increase to at least 0.59m/s in order to demonstrate normalized household ambulation with LRD to reduce fall risk.    Baseline 03/03/20 unable to complete distance    Time 4    Period Weeks    Status New             PT Long Term Goals - 03/03/20 1746      PT LONG TERM GOAL #1   Title Pt will increase to at least 1.58m/s in order to demonstrate normalized community ambulation with LRD to reduce fall risk.    Baseline 03/03/20 unable to complete distance, 0.68m/s at prior d/c    Time 8    Period Weeks    Status New      PT LONG TERM GOAL #2   Title Pt will decrease 5TSTS by to at least 12seconds in order to demonstrate normalized LE strength needed for safe, ind transfers    Baseline 03/03/20 40sec    Time 8    Period Weeks    Status New      PT LONG TERM GOAL #3   Title Patient will increase FOTO score to 57 to demonstrate predicted increase in functional mobility to complete ADLs    Baseline 03/03/20 30    Time 8    Period Weeks    Status New      PT LONG TERM GOAL #4   Title Pt will decrease worst pain as reported on NPRS by at least 3 points in order to demonstrate clinically significant reduction in pain.    Baseline 03/03/20 8/10    Time 8    Period Weeks    Status New                 Plan - 04/04/20 0945    Clinical Impression Statement Pt is showing improvements with RLE strength and endurance, stating he was able to walk around the mall for this weekend before needing a break. Pt able to complete exercises to increase endurance with step ups with breaks in between  sets. Pt able to complete higher reps on knee extension machine and continues to show good  progression with R knee mobility and strength. PT will continue to progress as able.    Personal Factors and Comorbidities Comorbidity 1;Comorbidity 2;Comorbidity 3+;Education    Comorbidities HTN, CHF, CVA Jan 2021    Examination-Activity Limitations Bathing;Lift;Dressing;Sit;Stand;Transfers    Examination-Participation Restrictions Cleaning;Community Activity;Yard Work;Driving    Stability/Clinical Decision Making Evolving/Moderate complexity    Clinical Decision Making Moderate    Rehab Potential Good    PT Frequency 2x / week    PT Duration 8 weeks    PT Treatment/Interventions ADLs/Self Care Home Management;Cryotherapy;Ultrasound;Gait training;Stair training;Therapeutic activities;Therapeutic exercise;Neuromuscular re-education;Patient/family education;Manual techniques;Orthotic Fit/Training;Dry needling;Joint Manipulations;Vestibular;Spinal Manipulations;Passive range of motion;Balance training;Functional mobility training;Moist Heat;Electrical Stimulation;DME Instruction    PT Next Visit Plan ROM/strength    PT Home Exercise Plan SLR, sidelying abd, prone hip ext, heel slides           Patient will benefit from skilled therapeutic intervention in order to improve the following deficits and impairments:  Abnormal gait, Decreased activity tolerance, Decreased endurance, Decreased range of motion, Decreased strength, Increased fascial restricitons, Improper body mechanics, Impaired sensation, Pain, Postural dysfunction, Impaired tone, Impaired flexibility, Increased muscle spasms, Difficulty walking, Decreased safety awareness, Decreased mobility, Decreased coordination, Decreased balance, Impaired UE functional use  Visit Diagnosis: Acute pain of right knee  Chronic pain of right knee  Chronic pain of left knee     Problem List Patient Active Problem List   Diagnosis Date Noted  . 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 Osteoarthritis of right knee 02/23/2020   . Nonischemic cardiomyopathy (HCC)   . HFrEF (heart failure with reduced ejection fraction) (HCC)   . Stroke (HCC) 06/18/2019  . Hypertension   . Left-sided weakness   . Hypokalemia   . Tobacco abuse     Hilda Lias DPT [place], SPT  Hilda Lias 04/04/2020, 2:40 PM  [facility] PHYSICAL AND SPORTS MEDICINE [address], [place], [address] Phone: [phone]   Fax:  [phone]  Name: Isack Lavalley MRN: [id number] Date of Birth: 11/19/1955

## 2020-04-05 ENCOUNTER — Other Ambulatory Visit: Payer: Self-pay

## 2020-04-05 MED ORDER — LOSARTAN POTASSIUM 100 MG PO TABS: 100.0000 mg | ORAL_TABLET | Freq: Every day | ORAL | 0 refills | Status: DC

## 2020-04-05 MED ORDER — ATORVASTATIN CALCIUM 80 MG PO TABS: 80.0000 mg | ORAL_TABLET | Freq: Every day | ORAL | 0 refills | Status: DC

## 2020-04-05 MED ORDER — CARVEDILOL 25 MG PO TABS: 25.0000 mg | ORAL_TABLET | Freq: Two times a day (BID) | ORAL | 0 refills | Status: DC

## 2020-04-05 MED ORDER — SPIRONOLACTONE 25 MG PO TABS: 25.0000 mg | ORAL_TABLET | Freq: Every day | ORAL | 0 refills | Status: DC

## 2020-04-05 NOTE — Telephone Encounter (Signed)
Refill request for Xarelto  Requested Prescriptions   Pending Prescriptions Disp Refills   rivaroxaban (XARELTO) 10 MG TABS tablet 35 tablet 0    Sig: Take 1 tablet (10 mg total) by mouth daily.   Signed Prescriptions Disp Refills   carvedilol (COREG) 25 MG tablet 180 tablet 0    Sig: Take 1 tablet (25 mg total) by mouth 2 (two) times daily.    Authorizing Provider: Debbe Odea    Ordering User: NEWCOMER MCCLAIN, Emari Hreha L   atorvastatin (LIPITOR) 80 MG tablet 90 tablet 0    Sig: Take 1 tablet (80 mg total) by mouth daily at 6 PM.    Authorizing Provider: Debbe Odea    Ordering User: NEWCOMER MCCLAIN, Brittnae Aschenbrenner L   losartan (COZAAR) 100 MG tablet 90 tablet 0    Sig: Take 1 tablet (100 mg total) by mouth daily.    Authorizing Provider: Debbe Odea    Ordering User: Thayer Headings, Jill Ruppe L   spironolactone (ALDACTONE) 25 MG tablet 90 tablet 0    Sig: Take 1 tablet (25 mg total) by mouth daily.    Authorizing Provider: Debbe Odea    Ordering User: Thayer Headings, Analyse Angst L

## 2020-04-05 NOTE — Telephone Encounter (Signed)
74m, 71.3kg, scr 1.58 (02/25/20), ccr 48.9, lovw/agboretang (02/08/20).  Pt requesting refill of xarelto 10mg . Based on ccr should be at 15mg . I do not refill xarelto 10mg  so I will route to pharmd pool to make that call

## 2020-04-05 NOTE — Telephone Encounter (Signed)
*  STAT* If patient is at the pharmacy, call can be transferred to refill team.   1. Which medications need to be refilled? (please list name of each medication and dose if known)  Spironolactone, Xarelto, Losartan, Carvedilol, Lipitor,   2. Which pharmacy/location (including street and city if local pharmacy) is medication to be sent to? [facility]  3. Do they need a 30 day or 90 day supply? 90

## 2020-04-06 ENCOUNTER — Other Ambulatory Visit: Payer: Self-pay

## 2020-04-06 ENCOUNTER — Ambulatory Visit: Payer: Self-pay | Admitting: Physical Therapy

## 2020-04-06 DIAGNOSIS — R262 Difficulty in walking, not elsewhere classified: Secondary | ICD-10-CM

## 2020-04-06 DIAGNOSIS — M25562 Pain in left knee: Secondary | ICD-10-CM

## 2020-04-06 DIAGNOSIS — M25561 Pain in right knee: Secondary | ICD-10-CM

## 2020-04-06 DIAGNOSIS — R2689 Other abnormalities of gait and mobility: Secondary | ICD-10-CM

## 2020-04-06 DIAGNOSIS — G8929 Other chronic pain: Secondary | ICD-10-CM

## 2020-04-06 NOTE — Telephone Encounter (Signed)
Prescribed by ortho- forwarding to them for authorization.

## 2020-04-06 NOTE — Telephone Encounter (Signed)
Dose is Not for Afib management. Can not find documented reason for chronic Xarelto use.

## 2020-04-06 NOTE — Therapy (Signed)
[place] [facility] PH[facility]TS MEDICINE [address] [place], [address] Phone: [phone]   Fax:  [phone]  Physical Therapy Treatment  Patient Details  Name: Juan Hughes MRN: [id number] Date of Birth: 1956/01/06 No data recorded  Encounter Date: 04/06/2020   PT End of Session - 04/06/20 1118    Visit Number 10    Number of Visits 17    Date for PT Re-Evaluation 04/29/20    PT Start Time 1045    PT Stop Time 1118    PT Time Calculation (min) 33 min    Activity Tolerance Patient tolerated treatment well;Patient limited by pain    Behavior During Therapy [facility] for tasks assessed/performed           Past Medical History:  Diagnosis Date  . CHF (congestive heart failure) (HCC)    last ef 40-45% echo 05/2019  . GERD (gastroesophageal reflux disease)   . HFrEF (heart failure with reduced ejection fraction) (HCC)   . Hypertension   . Osteoarthritis of right knee   . Stroke [facility]) 06/2019   left sided weakness    Past Surgical History:  Procedure Laterality Date  . INNER EAR SURGERY     as teenager  . TOTAL KNEE ARTHROPLASTY Right 02/24/2020   Procedure: RIGHT TOTAL KNEE ARTHROPLASTY;  Surgeon: Tarry Kos, MD;  Location: [facility];  Service: Orthopedics;  Laterality: Right;    There were no vitals filed for this visit.   Subjective Assessment - 04/06/20 1047    Subjective Pt reports 3/10 pain in R leg and R glute that has made it difficult to be able to walk and complete HEP.    Pertinent History Pt is a 64 year old male familiar with this clinic presenting with R TKA 02/24/20. Patient being seen for stroke deficits with R sided weakness, deconditioning, postural dysfunction and pain prior to replacement, with hopes TKA would aid in ind function. Patient ambulates into clinic with RW, is using RW in home walking across his house, and using CPM 2 hours 4x/day. Has 5 stairs to enter/exit stairs and  uses rail with family member assisting him with RW. He is dressing with minA from his wife to aid with donning/doffing pants, wife assists him in lifting his RLE into bath where he has a seat he is able to bath himself, and wife applies R shoes. He has been completing ankle pumps and AAROM SLR with wife, but did not have HHPT, does not have a regular exercise regimen. Pt goal is to ambulate without AD and dance. Pain is achy, post surgical, reports following walking into clinic 20/10 (10/10), best pain 0/10 at rest with medication.    Limitations Lifting;House hold activities;Walking    How long can you sit comfortably? unlimited    How long can you stand comfortably? 5-16mins    How long can you walk comfortably? 1-87mins    Patient Stated Goals Ambulate without AD and dance    Currently in Pain? Yes    Pain Score 3     Pain Location Knee    Pain Onset 1 to 4 weeks ago            Session shortened d/t patient arriving late  Ther-Ex AAROM matrix bike, no resistance seat 5 5 mins      Lunges 2x10 with TKE BTB with cane on R side and cues to pull band and straighten knee   SLS on firm  surface  RLE 7secs w/o two finger support, >30sec w/support LLE unable to do w/o two finger support, can hold >30secs with support     Anterior and Posterior Step ups 3reps, discontinued d/t to pain and difficulty with SLS on LLE. Modified to do step ups and lateral side stepping on 3inch step  2x12 with cues to increase speed                        PT Education - 04/06/20 1133    Education Details therex form/technique    Person(s) Educated Patient    Methods Explanation;Demonstration;Tactile cues    Comprehension Verbalized understanding;Returned demonstration;Verbal cues required            PT Short Term Goals - 03/03/20 1748      PT SHORT TERM GOAL #1   Title Pt will be independent with HEP in order to improve strength and balance in order to decrease fall risk and improve  function at home and work.    Baseline 03/03/20 HEP given    Time 4    Period Weeks    Status New      PT SHORT TERM GOAL #2   Title Pt will decrease 5TSTS by at least 3 seconds in order to demonstrate clinically significant improvement in LE strength    Baseline 03/03/20 40sec    Time 4    Period Weeks    Status New      PT SHORT TERM GOAL #3   Title Pt will increase to at least 0.47m/s in order to demonstrate normalized household ambulation with LRD to reduce fall risk.    Baseline 03/03/20 unable to complete distance    Time 4    Period Weeks    Status New             PT Long Term Goals - 03/03/20 1746      PT LONG TERM GOAL #1   Title Pt will increase to at least 1.56m/s in order to demonstrate normalized community ambulation with LRD to reduce fall risk.    Baseline 03/03/20 unable to complete distance, 0.87m/s at prior d/c    Time 8    Period Weeks    Status New      PT LONG TERM GOAL #2   Title Pt will decrease 5TSTS by to at least 12seconds in order to demonstrate normalized LE strength needed for safe, ind transfers    Baseline 03/03/20 40sec    Time 8    Period Weeks    Status New      PT LONG TERM GOAL #3   Title Patient will increase FOTO score to 57 to demonstrate predicted increase in functional mobility to complete ADLs    Baseline 03/03/20 30    Time 8    Period Weeks    Status New      PT LONG TERM GOAL #4   Title Pt will decrease worst pain as reported on NPRS by at least 3 points in order to demonstrate clinically significant reduction in pain.    Baseline 03/03/20 8/10    Time 8    Period Weeks    Status New                 Plan - 04/06/20 1120    Clinical Impression Statement Pt returns to therapy with complaints of R glute pain that has made it difficult for him to be active the  last few days. Pt states the pain today is a 3/10 but he feels very tired and in pain. Pt was able to tolerate therex well but required longer  rest breaks and asked to end session early because he had to leave and stated he would put ice on his R glute and try stretching later at home. Pt is unable to complete SLS on firm surface without 2 finger support when standing on LLE but able to reach up to 7secs on RLE. Pt unable to complete anterior and posterior step ups with heel touches due to LLE weakness and inability to bend LLE, activity modified to do step ups instead. Pt able to complete step ups and lateral step ups at a faster rate, showing good improvements with BLE endurance. PT will progress as able.    Personal Factors and Comorbidities Comorbidity 1;Comorbidity 2;Comorbidity 3+;Education    Examination-Activity Limitations Bathing;Lift;Dressing;Sit;Stand;Transfers    Examination-Participation Restrictions Cleaning;Community Activity;Yard Work;Driving    Stability/Clinical Decision Making Evolving/Moderate complexity    Clinical Decision Making Moderate    Rehab Potential Good    PT Frequency 2x / week    PT Duration 8 weeks    PT Treatment/Interventions ADLs/Self Care Home Management;Cryotherapy;Ultrasound;Gait training;Stair training;Therapeutic activities;Therapeutic exercise;Neuromuscular re-education;Patient/family education;Manual techniques;Orthotic Fit/Training;Dry needling;Joint Manipulations;Vestibular;Spinal Manipulations;Passive range of motion;Balance training;Functional mobility training;Moist Heat;Electrical Stimulation;DME Instruction    PT Next Visit Plan ROM/strength, REASSESS    PT Home Exercise Plan SLR, sidelying abd, prone hip ext, heel slides    Consulted and Agree with Plan of Care Patient           Patient will benefit from skilled therapeutic intervention in order to improve the following deficits and impairments:  Abnormal gait, Decreased activity tolerance, Decreased endurance, Decreased range of motion, Decreased strength, Increased fascial restricitons, Improper body mechanics, Impaired  sensation, Pain, Postural dysfunction, Impaired tone, Impaired flexibility, Increased muscle spasms, Difficulty walking, Decreased safety awareness, Decreased mobility, Decreased coordination, Decreased balance, Impaired UE functional use  Visit Diagnosis: Acute pain of right knee  Chronic pain of right knee  Chronic pain of left knee  Other abnormalities of gait and mobility  Difficulty in walking, not elsewhere classified     Problem List Patient Active Problem List   Diagnosis Date Noted  . 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 Osteoarthritis of right knee 02/23/2020  . Nonischemic cardiomyopathy (HCC)   . HFrEF (heart failure with reduced ejection fraction) (HCC)   . Stroke (HCC) 06/18/2019  . Hypertension   . Left-sided weakness   . Hypokalemia   . Tobacco abuse     Hilda Lias DPT [place], SPT Hilda Lias 04/06/2020, 12:53 PM  [place] [facility] PHYSICAL AND SPORTS MEDICINE [address], [place], [address] Phone: [phone]   Fax:  [phone]  Name: Juan Hughes MRN: [id number] Date of Birth: [birth year]

## 2020-04-06 NOTE — Telephone Encounter (Signed)
This was only to be used for post-op dvt ppx.  Does not need refill per ortho

## 2020-04-06 NOTE — Addendum Note (Signed)
Addended by: Rhea Belton R on: 04/06/2020 12:08 PM   Modules accepted: Orders

## 2020-04-07 ENCOUNTER — Encounter: Payer: Self-pay | Admitting: Orthopaedic Surgery

## 2020-04-07 ENCOUNTER — Ambulatory Visit (INDEPENDENT_AMBULATORY_CARE_PROVIDER_SITE_OTHER): Payer: Self-pay

## 2020-04-07 ENCOUNTER — Ambulatory Visit (INDEPENDENT_AMBULATORY_CARE_PROVIDER_SITE_OTHER): Payer: Self-pay | Admitting: Orthopaedic Surgery

## 2020-04-07 DIAGNOSIS — Z96651 Presence of right artificial knee joint: Secondary | ICD-10-CM

## 2020-04-07 MED ORDER — TRAMADOL HCL 50 MG PO TABS: 50.0000 mg | ORAL_TABLET | Freq: Two times a day (BID) | ORAL | 0 refills | Status: DC | PRN

## 2020-04-07 NOTE — Progress Notes (Signed)
Post-Op Visit Note   Patient: Juan Hughes           Date of Birth: Jan 06, 1956           MRN: [id number] Visit Date: 04/07/2020 PCP: Gorden Harms, PA-C   Assessment & Plan:  Chief Complaint:  Chief Complaint  Patient presents with  . Right Knee - Pain   Visit Diagnoses:  1. History of total knee replacement, right     Plan:  Patient is 6 weeks status post right total knee replacement.  Overall doing very well and very happy.  He has 4 remaining sessions of outpatient PT.  Ambulating with a cane currently.  He has been taking the Xarelto as prescribed.  Denies any calf tenderness or pain.  Right knee shows a fully healed surgical scar.  He has excellent range of motion from 0 to 110 degrees.  Stable to varus valgus.  No signs of infection.  Mild swelling.  No calf tenderness.  X-rays demonstrate stable total knee replacement without any complications.  From my standpoint patient has done very well so far.  He will complete the remaining PT visits.  He may drive at this point.  He can discontinue his Xarelto at this point.  Tramadol was prescribed today for breakthrough pain.  Recheck in 6 weeks with two-view x-rays of the right knee.  Follow-Up Instructions: Return in about 6 weeks (around 05/19/2020).   Orders:  Orders Placed This Encounter  Procedures  . XR KNEE 3 VIEW RIGHT   Meds ordered this encounter  Medications  . traMADol (ULTRAM) 50 MG tablet    Sig: Take 1-2 tablets (50-100 mg total) by mouth 2 (two) times daily as needed.    Dispense:  40 tablet    Refill:  0    Imaging: XR KNEE 3 VIEW RIGHT  Result Date: 04/07/2020 Stable total knee replacement without complication.   PMFS History: Patient Active Problem List   Diagnosis Date Noted  . 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 Osteoarthritis of right knee 02/23/2020  . Nonischemic cardiomyopathy (HCC)   . HFrEF (heart failure with reduced ejection fraction) (HCC)   .  Stroke (HCC) 06/18/2019  . Hypertension   . Left-sided weakness   . Hypokalemia   . Tobacco abuse    Past Medical History:  Diagnosis Date  . CHF (congestive heart failure) (HCC)    last ef 40-45% echo 05/2019  . GERD (gastroesophageal reflux disease)   . HFrEF (heart failure with reduced ejection fraction) (HCC)   . Hypertension   . Osteoarthritis of right knee   . Stroke [facility]) 06/2019   left sided weakness    Family History  Problem Relation Age of Onset  . Hypertension Mother     Past Surgical History:  Procedure Laterality Date  . INNER EAR SURGERY     as teenager  . TOTAL KNEE ARTHROPLASTY Right 02/24/2020   Procedure: RIGHT TOTAL KNEE ARTHROPLASTY;  Surgeon: Tarry Kos, MD;  Location: [facility];  Service: Orthopedics;  Laterality: Right;   Social History   Occupational History  . Not on file  Tobacco Use  . Smoking status: Former Smoker    Types: Cigars    Quit date: 06/22/2019    Years since quitting: 0.7  . Smokeless tobacco: Never Used  Vaping Use  . Vaping Use: Never used  Substance and Sexual Activity  . Alcohol use: Never  . Drug use: Never  . Sexual  activity: Yes

## 2020-04-11 ENCOUNTER — Encounter: Payer: Self-pay | Admitting: Physical Therapy

## 2020-04-11 ENCOUNTER — Ambulatory Visit: Payer: Self-pay | Admitting: Physical Therapy

## 2020-04-11 ENCOUNTER — Other Ambulatory Visit: Payer: Self-pay

## 2020-04-11 DIAGNOSIS — G8929 Other chronic pain: Secondary | ICD-10-CM

## 2020-04-11 DIAGNOSIS — R262 Difficulty in walking, not elsewhere classified: Secondary | ICD-10-CM

## 2020-04-11 DIAGNOSIS — M25561 Pain in right knee: Secondary | ICD-10-CM

## 2020-04-11 DIAGNOSIS — R2689 Other abnormalities of gait and mobility: Secondary | ICD-10-CM

## 2020-04-11 NOTE — Therapy (Signed)
[facility] [address], [place], [address] Phone: [phone]   Fax:  [phone]  Physical Therapy Treatment  Patient Details  Name: Juan Hughes MRN: [id number] Date of Birth: [birth year] No data recorded  Encounter Date: 04/11/2020   PT End of Session - 04/11/20 1333    Visit Number 11    Number of Visits 17    Date for PT Re-Evaluation 04/29/20    PT Start Time 1038    PT Stop Time 1116    PT Time Calculation (min) 38 min    Equipment Utilized During Treatment Gait belt    Activity Tolerance Patient tolerated treatment well;Patient limited by pain    Behavior During Therapy WFL for tasks assessed/performed           Past Medical History:  Diagnosis Date  . CHF (congestive heart failure) ([place])    last ef 40-45% echo 05/2019  . GERD (gastroesophageal reflux disease)   . HFrEF (heart failure with reduced ejection fraction) ([place])   . Hypertension   . Osteoarthritis of right knee   . Stroke [facility]) 06/2019   left sided weakness    Past Surgical History:  Procedure Laterality Date  . INNER EAR SURGERY     as teenager  . TOTAL KNEE ARTHROPLASTY Right 02/24/2020   Procedure: RIGHT TOTAL KNEE ARTHROPLASTY;  Surgeon: Leandrew Koyanagi, MD;  Location: [employer];  Service: Orthopedics;  Laterality: Right;    There were no vitals filed for this visit.   Subjective Assessment - 04/11/20 1041    Subjective Patient reports 3/10 pain in medial side of R knee and was able to walk 1hr at [facility] this weekend.    Pertinent History Pt is a 64 year old male familiar with this clinic presenting with R TKA 02/24/20. Patient being seen for stroke deficits with R sided weakness, deconditioning, postural dysfunction and pain prior to replacement, with hopes TKA would aid in ind function. Patient ambulates into clinic with RW, is using RW in home walking across his house, and using CPM 2 hours  4x/day. Has 5 stairs to enter/exit stairs and uses rail with family member assisting him with RW. He is dressing with minA from his wife to aid with donning/doffing pants, wife assists him in lifting his RLE into bath where he has a seat he is able to bath himself, and wife applies R shoes. He has been completing ankle pumps and AAROM SLR with wife, but did not have HHPT, does not have a regular exercise regimen. Pt goal is to ambulate without AD and dance. Pain is achy, post surgical, reports following walking into clinic 20/10 (10/10), best pain 0/10 at rest with medication.    Limitations Lifting;House hold activities;Walking    How long can you sit comfortably? unlimited    How long can you stand comfortably? 5-56mns    How long can you walk comfortably? 1-241ms    Patient Stated Goals Ambulate without AD and dance    Currently in Pain? Yes    Pain Score 3     Pain Location Knee    Pain Orientation Medial    Pain Onset 1 to 4 weeks ago          Ther-Ex AAROM matrix bike, L3 seat 5 5 mins   5TSTS 10.45sec  10MWT  Self selected 13.93, fastest 11.96  Knee ROM 120/126     Lunges 2x10    Single  leg squats holding on for support   Lateral banded walks RTB 2x18f  SLS RLE 2sec, 4sec, 7sec    Updated HEP  Lunges daily, 3x/wk, 3x10 Single leg squat w/support daily, 3x/wk, 3x10  Lateral banded walks daily, 3x/wk, 3x10 SLS w/support 3/day, 3x/wl, 10-30sec hold                           PT Education - 04/11/20 1335    Education Details therex form/technique, updated HEP    Person(s) Educated Patient    Methods Explanation;Demonstration;Verbal cues    Comprehension Verbalized understanding;Returned demonstration;Verbal cues required            PT Short Term Goals - 04/11/20 1308      PT SHORT TERM GOAL #1   Title Pt will be independent with HEP in order to improve strength and balance in order to decrease fall risk and improve function at home and work.     Baseline 03/03/20 HEP given 04/10/20 updated HEP given    Time 4    Period Weeks    Status New      PT SHORT TERM GOAL #2   Title Pt will decrease 5TSTS by at least 3 seconds in order to demonstrate clinically significant improvement in LE strength    Baseline 03/03/20 40sec 04/11/20 10.45sec    Time 4    Period Weeks    Status Achieved      PT SHORT TERM GOAL #3   Title Pt will increase 10MWT to at least 0.827m in order to demonstrate normalized household ambulation with LRD to reduce fall risk.    Baseline 03/03/20 unable to complete distance 04/11/20 0.8377m   Time 4    Period Weeks    Status Achieved      PT SHORT TERM GOAL #4   Title Pt will demonstrate SLS on RLE to >10seconds w/o support to increase static posture and balance control    Baseline 04/11/20 7secs    Time 4    Period Weeks    Status New    Target Date 05/09/20             PT Long Term Goals - 04/11/20 1312      PT LONG TERM GOAL #1   Title Pt will increase 10MWT to at least 1.75m/66mn order to demonstrate normalized community ambulation with LRD to reduce fall risk.    Baseline 03/03/20 unable to complete distance, 0.1m/60m prior d/c 04/11/20 0.64m/s675mTime 8    Period Weeks    Status On-going      PT LONG TERM GOAL #2   Title Pt will decrease 5TSTS by to at least 12seconds in order to demonstrate normalized LE strength needed for safe, ind transfers    Baseline 03/03/20 40sec 04/11/20 10.45sec    Time 8    Period Weeks    Status Achieved      PT LONG TERM GOAL #3   Title Patient will increase FOTO score to 57 to demonstrate predicted increase in functional mobility to complete ADLs    Baseline 03/03/20 30 04/11/20 52    Time 8    Period Weeks      PT LONG TERM GOAL #4   Title Pt will decrease worst pain as reported on NPRS by at least 3 points with single leg stance activities in order to demonstrate clinically significant reduction in pain.    Baseline 03/03/20  8/10 04/11/20 10/10  with new low doses of medication    Time 8    Period Weeks    Status On-going                 Plan - 04/11/20 1317    Clinical Impression Statement Patient continues to present with R left knee pain but is showing great improvements with RLE ROM and strength. Pt is able to reach 126d of knee flexion and full knee extension.  Pt has met previously set goals for 5TSTS and 10MWT and will continue to work on reaching pain goals. Pt given updates HEP and PT will continue to progress as able.    Personal Factors and Comorbidities Comorbidity 1;Comorbidity 2;Comorbidity 3+;Education    Comorbidities HTN, CHF, CVA Jan 2021    Examination-Activity Limitations Bathing;Lift;Dressing;Sit;Stand;Transfers    Examination-Participation Restrictions Cleaning;Community Activity;Yard Work;Driving    Stability/Clinical Decision Making Evolving/Moderate complexity    Clinical Decision Making Moderate    Rehab Potential Good    PT Frequency 2x / week    PT Duration 8 weeks    PT Treatment/Interventions ADLs/Self Care Home Management;Cryotherapy;Ultrasound;Gait training;Stair training;Therapeutic activities;Therapeutic exercise;Neuromuscular re-education;Patient/family education;Manual techniques;Orthotic Fit/Training;Dry needling;Joint Manipulations;Vestibular;Spinal Manipulations;Passive range of motion;Balance training;Functional mobility training;Moist Heat;Electrical Stimulation;DME Instruction    PT Next Visit Plan ROM/strength, REASSESS    PT Home Exercise Plan lunges, single leg squats, lateral band walks, SLS    Consulted and Agree with Plan of Care Patient           Patient will benefit from skilled therapeutic intervention in order to improve the following deficits and impairments:  Abnormal gait, Decreased activity tolerance, Decreased endurance, Decreased range of motion, Decreased strength, Increased fascial restricitons, Improper body mechanics, Impaired sensation, Pain, Postural  dysfunction, Impaired tone, Impaired flexibility, Increased muscle spasms, Difficulty walking, Decreased safety awareness, Decreased mobility, Decreased coordination, Decreased balance, Impaired UE functional use  Visit Diagnosis: Chronic pain of right knee  Acute pain of right knee  Difficulty in walking, not elsewhere classified  Other abnormalities of gait and mobility  Chronic pain of left knee     Problem List Patient Active Problem List   Diagnosis Date Noted  . 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 Osteoarthritis of right knee 02/23/2020  . Nonischemic cardiomyopathy ([place])   . HFrEF (heart failure with reduced ejection fraction) ([place])   . Stroke ([place]) 06/18/2019  . Hypertension   . Left-sided weakness   . Hypokalemia   . Tobacco abuse     Durwin Reges DPT [place], SPT Durwin Reges 04/11/2020, 1:41 PM   [facility] [address], [place], [address] Phone: [phone]   Fax:  [phone]  Name: Juan Hughes MRN: [id number] Date of Birth: 12/15/1955

## 2020-04-13 ENCOUNTER — Other Ambulatory Visit: Payer: Self-pay

## 2020-04-13 ENCOUNTER — Ambulatory Visit: Payer: Self-pay

## 2020-04-13 DIAGNOSIS — R2689 Other abnormalities of gait and mobility: Secondary | ICD-10-CM

## 2020-04-13 DIAGNOSIS — G8929 Other chronic pain: Secondary | ICD-10-CM

## 2020-04-13 DIAGNOSIS — R262 Difficulty in walking, not elsewhere classified: Secondary | ICD-10-CM

## 2020-04-13 DIAGNOSIS — M25561 Pain in right knee: Secondary | ICD-10-CM

## 2020-04-13 NOTE — Therapy (Signed)
[facility] [address], [place], [address] Phone: [phone]   Fax:  [phone]  Physical Therapy Treatment  Patient Details  Name: Bronislaus Verdell MRN: [id number] Date of Birth: December 29, 1955 No data recorded  Encounter Date: 04/13/2020   PT End of Session - 04/13/20 1211    Visit Number 12    Number of Visits 17    Date for PT Re-Evaluation 04/29/20    PT Start Time 1030    PT Stop Time 1113    PT Time Calculation (min) 43 min    Equipment Utilized During Treatment Gait belt    Activity Tolerance Patient tolerated treatment well    Behavior During Therapy [facility] for tasks assessed/performed           Past Medical History:  Diagnosis Date  . CHF (congestive heart failure) ([facility])    last ef 40-45% echo 05/2019  . GERD (gastroesophageal reflux disease)   . HFrEF (heart failure with reduced ejection fraction) ([facility])   . Hypertension   . Osteoarthritis of right knee   . Stroke [facility]) 06/2019   left sided weakness    Past Surgical History:  Procedure Laterality Date  . INNER EAR SURGERY     as teenager  . TOTAL KNEE ARTHROPLASTY Right 02/24/2020   Procedure: RIGHT TOTAL KNEE ARTHROPLASTY;  Surgeon: Tarry Kos, MD;  Location: [facility];  Service: Orthopedics;  Laterality: Right;    There were no vitals filed for this visit.   Subjective Assessment - 04/13/20 1032    Subjective Patient reports 0/10 pain and states that he is able to sleep better at night after getting new pain medication yesterday. Pt reports he is taking the medication once at 12PM and again at 12AM every day.    Pertinent History Pt is a 64 year old male familiar with this clinic presenting with R TKA 02/24/20. Patient being seen for stroke deficits with R sided weakness, deconditioning, postural dysfunction and pain prior to replacement, with hopes TKA would aid in ind function. Patient ambulates into clinic  with RW, is using RW in home walking across his house, and using CPM 2 hours 4x/day. Has 5 stairs to enter/exit stairs and uses rail with family member assisting him with RW. He is dressing with minA from his wife to aid with donning/doffing pants, wife assists him in lifting his RLE into bath where he has a seat he is able to bath himself, and wife applies R shoes. He has been completing ankle pumps and AAROM SLR with wife, but did not have HHPT, does not have a regular exercise regimen. Pt goal is to ambulate without AD and dance. Pain is achy, post surgical, reports following walking into clinic 20/10 (10/10), best pain 0/10 at rest with medication.    Limitations Lifting;House hold activities;Walking    How long can you sit comfortably? unlimited    How long can you stand comfortably? 5-50mins    How long can you walk comfortably? 1-69mins    Patient Stated Goals Ambulate without AD and dance    Currently in Pain? No/denies    Pain Onset 1 to 4 weeks ago          Ther-Ex AAROM matrix bike, L3 seat 5 5 mins    Lunges with cane for support 2x10  Single leg squat w/support on table 2x10  Lateral banded walks with RTB 2x34ft  SLS w/two finger taps for occasional  support 2x30sec    Gait Training Stepping over obstacles with alternating pattern Lateral walking       PT Education - 04/13/20 1049    Education Details therex form/technique    Person(s) Educated Patient    Methods Explanation;Demonstration;Verbal cues    Comprehension Verbalized understanding;Returned demonstration;Verbal cues required            PT Short Term Goals - 04/11/20 1308      PT SHORT TERM GOAL #1   Title Pt will be independent with HEP in order to improve strength and balance in order to decrease fall risk and improve function at home and work.    Baseline 03/03/20 HEP given 04/10/20 updated HEP given    Time 4    Period Weeks    Status New      PT SHORT TERM GOAL #2   Title Pt will decrease  5TSTS by at least 3 seconds in order to demonstrate clinically significant improvement in LE strength    Baseline 03/03/20 40sec 04/11/20 10.45sec    Time 4    Period Weeks    Status Achieved      PT SHORT TERM GOAL #3   Title Pt will increase to at least 0.52m/s in order to demonstrate normalized household ambulation with LRD to reduce fall risk.    Baseline 03/03/20 unable to complete distance 04/11/20 0.59m/s    Time 4    Period Weeks    Status Achieved      PT SHORT TERM GOAL #4   Title Pt will demonstrate SLS on RLE to >10seconds w/o support to increase static posture and balance control    Baseline 04/11/20 7secs    Time 4    Period Weeks    Status New    Target Date 05/09/20             PT Long Term Goals - 04/11/20 1312      PT LONG TERM GOAL #1   Title Pt will increase to at least 1.82m/s in order to demonstrate normalized community ambulation with LRD to reduce fall risk.    Baseline 03/03/20 unable to complete distance, 0.53m/s at prior d/c 04/11/20 0.32m/s    Time 8    Period Weeks    Status On-going      PT LONG TERM GOAL #2   Title Pt will decrease 5TSTS by to at least 12seconds in order to demonstrate normalized LE strength needed for safe, ind transfers    Baseline 03/03/20 40sec 04/11/20 10.45sec    Time 8    Period Weeks    Status Achieved      PT LONG TERM GOAL #3   Title Patient will increase FOTO score to 57 to demonstrate predicted increase in functional mobility to complete ADLs    Baseline 03/03/20 30 04/11/20 52    Time 8    Period Weeks      PT LONG TERM GOAL #4   Title Pt will decrease worst pain as reported on NPRS by at least 3 points with single leg stance activities in order to demonstrate clinically significant reduction in pain.    Baseline 03/03/20 8/10 04/11/20 10/10 with new low doses of medication    Time 8    Period Weeks    Status On-going                 Plan - 04/13/20 1212    Clinical Impression  Statement Pt returns to therapy with  no pain in R knee and ability to demonstrate compliance with updated HEP. Pt requires cues with lunges and single leg squats to only go as deep as he can tolerate without difficulty or pain. Pt states he has been practicing lateral walking with his wife and it helps when he plays music to step along to. Patient is able to demonstrate alternating stepping pattern over hurdles with decreased LOB, showing good improvements with gait. Pt reports he would like to continue with therapy to work on his balance and gait. Pt states he has to check with financial resources to see if he will be able to continue therapy and get more visits after next week. PT will progress as able.    Personal Factors and Comorbidities Comorbidity 1;Comorbidity 2;Comorbidity 3+;Education    Comorbidities HTN, CHF, CVA Jan 2021    Examination-Activity Limitations Bathing;Lift;Dressing;Sit;Stand;Transfers    Examination-Participation Restrictions Cleaning;Community Activity;Yard Work;Driving    Stability/Clinical Decision Making Evolving/Moderate complexity    Clinical Decision Making Moderate    Rehab Potential Good    PT Frequency 2x / week    PT Duration 8 weeks    PT Treatment/Interventions ADLs/Self Care Home Management;Cryotherapy;Ultrasound;Gait training;Stair training;Therapeutic activities;Therapeutic exercise;Neuromuscular re-education;Patient/family education;Manual techniques;Orthotic Fit/Training;Dry needling;Joint Manipulations;Vestibular;Spinal Manipulations;Passive range of motion;Balance training;Functional mobility training;Moist Heat;Electrical Stimulation;DME Instruction    PT Next Visit Plan ROM/strength, REASSESS    PT Home Exercise Plan lunges, single leg squats, lateral band walks, SLS    Consulted and Agree with Plan of Care Patient           Patient will benefit from skilled therapeutic intervention in order to improve the following deficits and impairments:   Abnormal gait, Decreased activity tolerance, Decreased endurance, Decreased range of motion, Decreased strength, Increased fascial restricitons, Improper body mechanics, Impaired sensation, Pain, Postural dysfunction, Impaired tone, Impaired flexibility, Increased muscle spasms, Difficulty walking, Decreased safety awareness, Decreased mobility, Decreased coordination, Decreased balance, Impaired UE functional use  Visit Diagnosis: Acute pain of right knee  Difficulty in walking, not elsewhere classified  Other abnormalities of gait and mobility  Chronic pain of left knee  Chronic pain of right knee     Problem List Patient Active Problem List   Diagnosis Date Noted  . 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 Osteoarthritis of right knee 02/23/2020  . Nonischemic cardiomyopathy ([facility])   . HFrEF (heart failure with reduced ejection fraction) ([facility])   . Stroke ([facility]) 06/18/2019  . Hypertension   . Left-sided weakness   . Hypokalemia   . Tobacco abuse    Cassie Freer, SPT [place],Allan C 04/13/2020, 12:46 PM  [facility] [address], [place], [address] Phone: [phone]   Fax:  [phone]  Name: Sinai Mahany MRN: [id number] Date of Birth: 14-Jun-1955

## 2020-04-18 ENCOUNTER — Encounter: Payer: Self-pay | Admitting: Physical Therapy

## 2020-04-18 ENCOUNTER — Ambulatory Visit: Payer: Self-pay | Admitting: Physical Therapy

## 2020-04-18 ENCOUNTER — Other Ambulatory Visit: Payer: Self-pay

## 2020-04-18 DIAGNOSIS — M25562 Pain in left knee: Secondary | ICD-10-CM

## 2020-04-18 DIAGNOSIS — R262 Difficulty in walking, not elsewhere classified: Secondary | ICD-10-CM

## 2020-04-18 DIAGNOSIS — M25561 Pain in right knee: Secondary | ICD-10-CM

## 2020-04-18 DIAGNOSIS — R2689 Other abnormalities of gait and mobility: Secondary | ICD-10-CM

## 2020-04-18 NOTE — Therapy (Signed)
[facility] PHYSICAL AND SPORTS MEDICINE [address] [place], [address] Phone: [phone]   Fax:  [phone]  Physical Therapy Treatment  Patient Details  Name: Juan Hughes MRN: [id number] Date of Birth: Mar 27, 1956 No data recorded  Encounter Date: 04/18/2020   PT End of Session - 04/18/20 1200    Visit Number 13    Number of Visits 17    Date for PT Re-Evaluation 04/29/20    PT Start Time 1115    PT Stop Time 1155    PT Time Calculation (min) 40 min    Equipment Utilized During Treatment Gait belt    Activity Tolerance Patient tolerated treatment well    Behavior During Therapy WFL for tasks assessed/performed           Past Medical History:  Diagnosis Date  . CHF (congestive heart failure) (HCC)    last ef 40-45% echo 05/2019  . GERD (gastroesophageal reflux disease)   . HFrEF (heart failure with reduced ejection fraction) (HCC)   . Hypertension   . Osteoarthritis of right knee   . Stroke [facility]) 06/2019   left sided weakness    Past Surgical History:  Procedure Laterality Date  . INNER EAR SURGERY     as teenager  . TOTAL KNEE ARTHROPLASTY Right 02/24/2020   Procedure: RIGHT TOTAL KNEE ARTHROPLASTY;  Surgeon: Tarry Kos, MD;  Location: [facility];  Service: Orthopedics;  Laterality: Right;    There were no vitals filed for this visit.   Subjective Assessment - 04/18/20 1116    Subjective Patient reports 2/10 pain in the inside of knee and states that medications have been helping with his pain. Pt states he started walking outside 20-49mins every day.    Pertinent History Pt is a 64 year old male familiar with this clinic presenting with R TKA 02/24/20. Patient being seen for stroke deficits with R sided weakness, deconditioning, postural dysfunction and pain prior to replacement, with hopes TKA would aid in ind function. Patient ambulates into clinic with RW, is using RW in home walking  across his house, and using CPM 2 hours 4x/day. Has 5 stairs to enter/exit stairs and uses rail with family member assisting him with RW. He is dressing with minA from his wife to aid with donning/doffing pants, wife assists him in lifting his RLE into bath where he has a seat he is able to bath himself, and wife applies R shoes. He has been completing ankle pumps and AAROM SLR with wife, but did not have HHPT, does not have a regular exercise regimen. Pt goal is to ambulate without AD and dance. Pain is achy, post surgical, reports following walking into clinic 20/10 (10/10), best pain 0/10 at rest with medication.    Limitations Lifting;House hold activities;Walking    How long can you sit comfortably? unlimited    How long can you stand comfortably? 5-24mins    How long can you walk comfortably? 1-69mins    Patient Stated Goals Ambulate without AD and dance    Currently in Pain? Yes    Pain Score 2     Pain Location Knee    Pain Orientation Medial    Pain Onset 1 to 4 weeks ago          Therex AAROM matrix bike, L3 seat 5 5 mins     Single leg squat w/support on table 3x10 and elevated mat table    Lateral banded walks  with RTB 3x63ft, monster walks    SLS w/two finger taps for occasional support 2x30sec   Gait Training Stepping over balance stones with alternating pattern 4 obstacles x6 Lateral walking stepping over obstacles 4 obstacles x6 CGA throughout for safety                        PT Education - 04/18/20 1200    Education Details therex form/technique    Methods Explanation;Demonstration;Verbal cues    Comprehension Verbalized understanding;Returned demonstration;Verbal cues required            PT Short Term Goals - 04/11/20 1308      PT SHORT TERM GOAL #1   Title Pt will be independent with HEP in order to improve strength and balance in order to decrease fall risk and improve function at home and work.    Baseline 03/03/20 HEP given 04/10/20  updated HEP given    Time 4    Period Weeks    Status New      PT SHORT TERM GOAL #2   Title Pt will decrease 5TSTS by at least 3 seconds in order to demonstrate clinically significant improvement in LE strength    Baseline 03/03/20 40sec 04/11/20 10.45sec    Time 4    Period Weeks    Status Achieved      PT SHORT TERM GOAL #3   Title Pt will increase to at least 0.44m/s in order to demonstrate normalized household ambulation with LRD to reduce fall risk.    Baseline 03/03/20 unable to complete distance 04/11/20 0.44m/s    Time 4    Period Weeks    Status Achieved      PT SHORT TERM GOAL #4   Title Pt will demonstrate SLS on RLE to >10seconds w/o support to increase static posture and balance control    Baseline 04/11/20 7secs    Time 4    Period Weeks    Status New    Target Date 05/09/20             PT Long Term Goals - 04/11/20 1312      PT LONG TERM GOAL #1   Title Pt will increase to at least 1.66m/s in order to demonstrate normalized community ambulation with LRD to reduce fall risk.    Baseline 03/03/20 unable to complete distance, 0.45m/s at prior d/c 04/11/20 0.39m/s    Time 8    Period Weeks    Status On-going      PT LONG TERM GOAL #2   Title Pt will decrease 5TSTS by to at least 12seconds in order to demonstrate normalized LE strength needed for safe, ind transfers    Baseline 03/03/20 40sec 04/11/20 10.45sec    Time 8    Period Weeks    Status Achieved      PT LONG TERM GOAL #3   Title Patient will increase FOTO score to 57 to demonstrate predicted increase in functional mobility to complete ADLs    Baseline 03/03/20 30 04/11/20 52    Time 8    Period Weeks      PT LONG TERM GOAL #4   Title Pt will decrease worst pain as reported on NPRS by at least 3 points with single leg stance activities in order to demonstrate clinically significant reduction in pain.    Baseline 03/03/20 8/10 04/11/20 10/10 with new low doses of medication    Time  8  Period Weeks    Status On-going                 Plan - 04/18/20 1200    Clinical Impression Statement Pt continues to present with R knee pain and difficulty with ambulation. Pt demonstrates mild difficulty with single leg squat and requires heavy cues to correct form. Patient able to improve single leg balance with less finger taps on RLE. Patient is able to complete gait training to step over obstacles without cane today but requires min CGA to prevent loss of balance. PT will progress as able.    Personal Factors and Comorbidities Comorbidity 1;Comorbidity 2;Comorbidity 3+;Education    Comorbidities HTN, CHF, CVA Jan 2021    Examination-Activity Limitations Bathing;Lift;Dressing;Sit;Stand;Transfers    Examination-Participation Restrictions Cleaning;Community Activity;Yard Work;Driving    Stability/Clinical Decision Making Evolving/Moderate complexity    Clinical Decision Making Moderate    Rehab Potential Good    PT Frequency 2x / week    PT Duration 8 weeks    PT Treatment/Interventions ADLs/Self Care Home Management;Cryotherapy;Ultrasound;Gait training;Stair training;Therapeutic activities;Therapeutic exercise;Neuromuscular re-education;Patient/family education;Manual techniques;Orthotic Fit/Training;Dry needling;Joint Manipulations;Vestibular;Spinal Manipulations;Passive range of motion;Balance training;Functional mobility training;Moist Heat;Electrical Stimulation;DME Instruction    PT Next Visit Plan ROM/strength, REASSESS    PT Home Exercise Plan lunges, single leg squats, lateral band walks, SLS    Consulted and Agree with Plan of Care Patient           Patient will benefit from skilled therapeutic intervention in order to improve the following deficits and impairments:  Abnormal gait, Decreased activity tolerance, Decreased endurance, Decreased range of motion, Decreased strength, Increased fascial restricitons, Improper body mechanics, Impaired sensation, Pain,  Postural dysfunction, Impaired tone, Impaired flexibility, Increased muscle spasms, Difficulty walking, Decreased safety awareness, Decreased mobility, Decreased coordination, Decreased balance, Impaired UE functional use  Visit Diagnosis: Acute pain of right knee  Difficulty in walking, not elsewhere classified  Other abnormalities of gait and mobility  Chronic pain of left knee     Problem List Patient Active Problem List   Diagnosis Date Noted  . 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 Osteoarthritis of right knee 02/23/2020  . Nonischemic cardiomyopathy (HCC)   . HFrEF (heart failure with reduced ejection fraction) (HCC)   . Stroke (HCC) 06/18/2019  . Hypertension   . Left-sided weakness   . Hypokalemia   . Tobacco abuse     Hilda Lias DPT [place], SPT Hilda Lias 04/18/2020, 4:58 PM  [place] [facility] PHYSICAL AND SPORTS MEDICINE [address], [place], [address] Phone: [phone]   Fax:  [phone]  Name: Juan Hughes MRN: [id number] Date of Birth: 1955/08/01

## 2020-04-21 ENCOUNTER — Ambulatory Visit: Payer: Self-pay | Attending: Physician Assistant | Admitting: Physical Therapy

## 2020-04-21 ENCOUNTER — Other Ambulatory Visit: Payer: Self-pay

## 2020-04-21 ENCOUNTER — Encounter: Payer: Self-pay | Admitting: Physical Therapy

## 2020-04-21 DIAGNOSIS — G8929 Other chronic pain: Secondary | ICD-10-CM | POA: Insufficient documentation

## 2020-04-21 DIAGNOSIS — R2689 Other abnormalities of gait and mobility: Secondary | ICD-10-CM | POA: Insufficient documentation

## 2020-04-21 DIAGNOSIS — R262 Difficulty in walking, not elsewhere classified: Secondary | ICD-10-CM | POA: Insufficient documentation

## 2020-04-21 DIAGNOSIS — M25562 Pain in left knee: Secondary | ICD-10-CM | POA: Insufficient documentation

## 2020-04-21 DIAGNOSIS — M25561 Pain in right knee: Secondary | ICD-10-CM | POA: Insufficient documentation

## 2020-04-21 NOTE — Therapy (Signed)
[place] [facility] PHYS[facility]ORTS MEDICINE [address] [place], [address] Phone: [phone]   Fax:  [phone]  Physical Therapy Treatment/DC Summary   Patient Details  Name: Juan Hughes MRN: [id number] Date of Birth: [birth year] No data recorded  Encounter Date: 04/21/2020   PT End of Session - 04/21/20 1155    Visit Number 14    Number of Visits 17    Date for PT Re-Evaluation 04/29/20    PT Start Time 1115    PT Stop Time 1153    PT Time Calculation (min) 38 min    Equipment Utilized During Treatment Gait belt    Activity Tolerance Patient tolerated treatment well    Behavior During Therapy [place] for tasks assessed/performed           Past Medical History:  Diagnosis Date  . CHF (congestive heart failure) (HCC)    last ef 40-45% echo 05/2019  . GERD (gastroesophageal reflux disease)   . HFrEF (heart failure with reduced ejection fraction) (HCC)   . Hypertension   . Osteoarthritis of right knee   . Stroke [facility]) 06/2019   left sided weakness    Past Surgical History:  Procedure Laterality Date  . INNER EAR SURGERY     as teenager  . TOTAL KNEE ARTHROPLASTY Right 02/24/2020   Procedure: RIGHT TOTAL KNEE ARTHROPLASTY;  Surgeon: Tarry Kos, MD;  Location: [facility];  Service: Orthopedics;  Laterality: Right;    There were no vitals filed for this visit.   Subjective Assessment - 04/21/20 1118    Subjective Patient continues to have mild pain in the medial side of knee and rates it a 2/10. Pt states walking and transfers have gotten easier.    Pertinent History Pt is a 64 year old male familiar with this clinic presenting with R TKA 02/24/20. Patient being seen for stroke deficits with R sided weakness, deconditioning, postural dysfunction and pain prior to replacement, with hopes TKA would aid in ind function. Patient ambulates into clinic with RW, is using RW in home walking across his house, and  using CPM 2 hours 4x/day. Has 5 stairs to enter/exit stairs and uses rail with family member assisting him with RW. He is dressing with minA from his wife to aid with donning/doffing pants, wife assists him in lifting his RLE into bath where he has a seat he is able to bath himself, and wife applies R shoes. He has been completing ankle pumps and AAROM SLR with wife, but did not have HHPT, does not have a regular exercise regimen. Pt goal is to ambulate without AD and dance. Pain is achy, post surgical, reports following walking into clinic 20/10 (10/10), best pain 0/10 at rest with medication.    Limitations Lifting;House hold activities;Walking    How long can you sit comfortably? unlimited    How long can you stand comfortably? 5-32mins    How long can you walk comfortably? 1-57mins    Currently in Pain? Yes    Pain Score 2     Pain Location Knee    Pain Orientation Medial    Pain Onset 1 to 4 weeks ago             Therex AAROM matrix bike,L3seat67mins    Lateral banded walkswith GTB 3x33ft  Single leg STS from chair using arm rest 3x10  SLS 2x30sec on foam   SLS RLE 13sec w/o support   1.29m/s  PT reviewed the following HEP with patient with patient able to demonstrate a set of the following with min cuing for correction needed. PT educated patient on parameters of therex (how/when to inc/decrease intensity, frequency, rep/set range, stretch hold time, and purpose of therex) with verbalized understanding.                          PT Education - 04/21/20 1240    Education Details therex form/technique    Person(s) Educated Patient    Methods Explanation;Demonstration;Verbal cues    Comprehension Verbalized understanding;Returned demonstration;Verbal cues required            PT Short Term Goals - 04/21/20 1147      PT SHORT TERM GOAL #1   Title Pt will be independent with HEP in order to improve strength and balance in order to  decrease fall risk and improve function at home and work.    Baseline 03/03/20 HEP given 04/10/20 updated HEP given 04/21/20 updated HEP    Time 4    Period Weeks    Status Achieved      PT SHORT TERM GOAL #2   Title Pt will decrease 5TSTS by at least 3 seconds in order to demonstrate clinically significant improvement in LE strength    Baseline 03/03/20 40sec 04/11/20 10.45sec    Time 4    Period Weeks    Status Achieved      PT SHORT TERM GOAL #3   Title Pt will increase to at least 0.67m/s in order to demonstrate normalized household ambulation with LRD to reduce fall risk.    Baseline 03/03/20 unable to complete distance 04/11/20 0.24m/s    Time 4    Period Weeks    Status Achieved      PT SHORT TERM GOAL #4   Title Pt will demonstrate SLS on RLE to >10seconds w/o support to increase static posture and balance control    Baseline 04/11/20 7secs 04/21/20 13sec    Time 4    Period Weeks    Status Achieved    Target Date 05/09/20      PT SHORT TERM GOAL #5   Title Will improve FOTO score to 57 to demonstrate subjective increased ability to participate in ADLs ind    Baseline 04/11/20 53; 04/21/20 63    Time 8    Period Weeks    Status Achieved             PT Long Term Goals - 04/21/20 1151      PT LONG TERM GOAL #1   Title Pt will increase to at least 1.51m/s in order to demonstrate normalized community ambulation with LRD to reduce fall risk.    Baseline 03/03/20 unable to complete distance, 0.69m/s at prior d/c 04/11/20 0.70m/s 04/21/20 1.78m/s w/SPC    Time 8    Period Weeks    Status Achieved      PT LONG TERM GOAL #2   Title Pt will decrease 5TSTS by to at least 12seconds in order to demonstrate normalized LE strength needed for safe, ind transfers    Baseline 03/03/20 40sec 04/11/20 10.45sec    Time 8    Period Weeks    Status Achieved      PT LONG TERM GOAL #4   Title Pt will decrease worst pain as reported on NPRS by at least 3 points with single  leg stance activities in order to demonstrate clinically significant  reduction in pain.    Baseline 03/03/20 8/10 04/11/20 10/10 with new low doses of medication 04/21/20 4/10 at worst    Time 8    Period Weeks    Status Achieved                 Plan - 04/21/20 1155    Clinical Impression Statement Pt is showing great improvements with RLE strength and ability to ambulate in the community with ease. PT session focused on creating new HEP and making sure patient is able to complete exercises at home with proper form and technique. Pt was educated on frequency and duration of exercises and reminded to take rest breaks to prevent muscle sorenes and overuse. Pt was able to properly demonstrate therex and was pleased with his progress overall. D/C from PT.    Personal Factors and Comorbidities Comorbidity 1;Comorbidity 2;Comorbidity 3+;Education    Comorbidities HTN, CHF, CVA Jan 2021    Examination-Activity Limitations Bathing;Lift;Dressing;Sit;Stand;Transfers    Examination-Participation Restrictions Cleaning;Community Activity;Yard Work;Driving    Stability/Clinical Decision Making Evolving/Moderate complexity    Clinical Decision Making Moderate    Rehab Potential Good    PT Frequency 2x / week    PT Duration 8 weeks    PT Treatment/Interventions ADLs/Self Care Home Management;Cryotherapy;Ultrasound;Gait training;Stair training;Therapeutic activities;Therapeutic exercise;Neuromuscular re-education;Patient/family education;Manual techniques;Orthotic Fit/Training;Dry needling;Joint Manipulations;Vestibular;Spinal Manipulations;Passive range of motion;Balance training;Functional mobility training;Moist Heat;Electrical Stimulation;DME Instruction    PT Next Visit Plan ROM/strength, REASSESS    PT Home Exercise Plan lunges, single leg squats, lateral band walks, SLS    Consulted and Agree with Plan of Care Patient           Patient will benefit from skilled therapeutic intervention in  order to improve the following deficits and impairments:  Abnormal gait, Decreased activity tolerance, Decreased endurance, Decreased range of motion, Decreased strength, Increased fascial restricitons, Improper body mechanics, Impaired sensation, Pain, Postural dysfunction, Impaired tone, Impaired flexibility, Increased muscle spasms, Difficulty walking, Decreased safety awareness, Decreased mobility, Decreased coordination, Decreased balance, Impaired UE functional use  Visit Diagnosis: Acute pain of right knee  Difficulty in walking, not elsewhere classified  Other abnormalities of gait and mobility  Chronic pain of left knee     Problem List Patient Active Problem List   Diagnosis Date Noted  . 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 Osteoarthritis of right knee 02/23/2020  . Nonischemic cardiomyopathy (HCC)   . HFrEF (heart failure with reduced ejection fraction) (HCC)   . Stroke (HCC) 06/18/2019  . Hypertension   . Left-sided weakness   . Hypokalemia   . Tobacco abuse     Hilda Lias DPT  [place], SPT Hilda Lias 04/21/2020, 2:02 PM  [place] [facility] PHYSICAL AND SPORTS MEDICINE [address] [place], [address] Phone: [phone]   Fax:  [phone]  Name: Juan Hughes MRN: [id number] Date of Birth: June 18, 1955

## 2020-04-28 ENCOUNTER — Telehealth: Payer: Self-pay | Admitting: Orthopaedic Surgery

## 2020-04-28 MED ORDER — TRAMADOL HCL 50 MG PO TABS: 50.0000 mg | ORAL_TABLET | Freq: Two times a day (BID) | ORAL | 0 refills | Status: DC | PRN

## 2020-04-28 NOTE — Addendum Note (Signed)
Addended by: Mayra Reel on: 04/28/2020 04:36 PM   Modules accepted: Orders

## 2020-04-28 NOTE — Telephone Encounter (Signed)
Pt called requesting a refill on his tramadol for pain

## 2020-04-28 NOTE — Telephone Encounter (Signed)
done

## 2020-05-19 ENCOUNTER — Ambulatory Visit (INDEPENDENT_AMBULATORY_CARE_PROVIDER_SITE_OTHER): Payer: Self-pay | Admitting: Orthopaedic Surgery

## 2020-05-19 ENCOUNTER — Encounter: Payer: Self-pay | Admitting: Orthopaedic Surgery

## 2020-05-19 ENCOUNTER — Ambulatory Visit (INDEPENDENT_AMBULATORY_CARE_PROVIDER_SITE_OTHER): Payer: Self-pay

## 2020-05-19 VITALS — Ht 65.0 in | Wt 155.0 lb

## 2020-05-19 DIAGNOSIS — Z96651 Presence of right artificial knee joint: Secondary | ICD-10-CM

## 2020-05-19 MED ORDER — TRAMADOL HCL 50 MG PO TABS: 50.0000 mg | ORAL_TABLET | Freq: Every day | ORAL | 0 refills | Status: DC | PRN

## 2020-05-19 NOTE — Progress Notes (Signed)
   Post-Op Visit Note   Patient: Juan Hughes           Date of Birth: 1955/10/19           MRN: [id number] Visit Date: 05/19/2020 PCP: Gorden Harms, PA-C   Assessment & Plan:  Chief Complaint:  Chief Complaint  Patient presents with  . Right Knee - Follow-up    Right total knee arthroplasty 02/24/2020   Visit Diagnoses:  1. History of total knee replacement, right     Plan:   Patient is 20-month status post right total knee replacement.  He is doing well overall.  He feels that he is getting better.  Currently doing home exercises.  Ambulating with a cane mainly because of the left knee.  Takes tramadol occasionally.  Surgical scar is fully healed.  Stable to varus valgus.  Excellent range of motion to 125 degrees without pain or difficulty.  X-rays demonstrate stable total knee replacement without any complications.  The patient will continue with home exercises for strengthening.  Dental prophylaxis reinforced.  We will recheck him in 3 months with two-view x-rays of the right knee.  Follow-Up Instructions: Return in about 3 months (around 08/17/2020).   Orders:  Orders Placed This Encounter  Procedures  . XR Knee 1-2 Views Right   Meds ordered this encounter  Medications  . traMADol (ULTRAM) 50 MG tablet    Sig: Take 1-2 tablets (50-100 mg total) by mouth daily as needed.    Dispense:  20 tablet    Refill:  0    Imaging: No results found.  PMFS History: Patient Active Problem List   Diagnosis Date Noted  . 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 Osteoarthritis of right knee 02/23/2020  . Nonischemic cardiomyopathy (HCC)   . HFrEF (heart failure with reduced ejection fraction) (HCC)   . Stroke (HCC) 06/18/2019  . Hypertension   . Left-sided weakness   . Hypokalemia   . Tobacco abuse    Past Medical History:  Diagnosis Date  . CHF (congestive heart failure) (HCC)    last ef 40-45% echo 05/2019  . GERD (gastroesophageal reflux  disease)   . HFrEF (heart failure with reduced ejection fraction) (HCC)   . Hypertension   . Osteoarthritis of right knee   . Stroke [facility]) 06/2019   left sided weakness    Family History  Problem Relation Age of Onset  . Hypertension Mother     Past Surgical History:  Procedure Laterality Date  . INNER EAR SURGERY     as teenager  . TOTAL KNEE ARTHROPLASTY Right 02/24/2020   Procedure: RIGHT TOTAL KNEE ARTHROPLASTY;  Surgeon: Tarry Kos, MD;  Location:  [facility]ERY [facility];  Service: Orthopedics;  Laterality: Right;   Social History   Occupational History  . Not on file  Tobacco Use  . Smoking status: Former Smoker    Types: Cigars    Quit date: 06/22/2019    Years since quitting: 0.9  . Smokeless tobacco: Never Used  Vaping Use  . Vaping Use: Never used  Substance and Sexual Activity  . Alcohol use: Never  . Drug use: Never  . Sexual activity: Yes

## 2020-06-07 ENCOUNTER — Telehealth: Payer: Self-pay | Admitting: Cardiology

## 2020-06-07 MED ORDER — ATORVASTATIN CALCIUM 80 MG PO TABS: 80.0000 mg | ORAL_TABLET | Freq: Every day | ORAL | 0 refills | Status: DC

## 2020-06-07 MED ORDER — CARVEDILOL 25 MG PO TABS: 25.0000 mg | ORAL_TABLET | Freq: Two times a day (BID) | ORAL | 0 refills | Status: DC

## 2020-06-07 MED ORDER — LOSARTAN POTASSIUM 100 MG PO TABS: 100.0000 mg | ORAL_TABLET | Freq: Every day | ORAL | 0 refills | Status: DC

## 2020-06-07 MED ORDER — SPIRONOLACTONE 25 MG PO TABS: 25.0000 mg | ORAL_TABLET | Freq: Every day | ORAL | 0 refills | Status: DC

## 2020-06-07 NOTE — Telephone Encounter (Signed)
*  STAT* If patient is at the pharmacy, call can be transferred to refill team.   1. Which medications need to be refilled? (please list name of each medication and dose if known) atorvastatin, carvedilol, losartan, spirolactone, hydralazine   2. Which pharmacy/location (including street and city if local pharmacy) is medication to be sent to?[facility]   3. Do they need a 30 day or 90 day supply? 90

## 2020-08-08 ENCOUNTER — Other Ambulatory Visit: Payer: Self-pay

## 2020-08-08 ENCOUNTER — Encounter: Payer: Self-pay | Admitting: Cardiology

## 2020-08-08 ENCOUNTER — Ambulatory Visit (INDEPENDENT_AMBULATORY_CARE_PROVIDER_SITE_OTHER): Payer: Self-pay | Admitting: Cardiology

## 2020-08-08 VITALS — BP 90/50 | HR 71 | Ht 63.0 in | Wt 159.0 lb

## 2020-08-08 DIAGNOSIS — I5020 Unspecified systolic (congestive) heart failure: Secondary | ICD-10-CM

## 2020-08-08 DIAGNOSIS — I10 Essential (primary) hypertension: Secondary | ICD-10-CM

## 2020-08-08 DIAGNOSIS — I639 Cerebral infarction, unspecified: Secondary | ICD-10-CM

## 2020-08-08 MED ORDER — LOSARTAN POTASSIUM 100 MG PO TABS: 100.0000 mg | ORAL_TABLET | Freq: Every day | ORAL | 2 refills | Status: DC

## 2020-08-08 MED ORDER — ATORVASTATIN CALCIUM 80 MG PO TABS: 80.0000 mg | ORAL_TABLET | Freq: Every day | ORAL | 2 refills | Status: DC

## 2020-08-08 MED ORDER — ASPIRIN EC 81 MG PO TBEC: 81.0000 mg | DELAYED_RELEASE_TABLET | Freq: Every day | ORAL | 3 refills | Status: DC

## 2020-08-08 MED ORDER — HYDRALAZINE HCL 50 MG PO TABS: 50.0000 mg | ORAL_TABLET | Freq: Three times a day (TID) | ORAL | 3 refills | Status: DC

## 2020-08-08 MED ORDER — CARVEDILOL 25 MG PO TABS: 25.0000 mg | ORAL_TABLET | Freq: Two times a day (BID) | ORAL | 2 refills | Status: DC

## 2020-08-08 MED ORDER — SPIRONOLACTONE 25 MG PO TABS: 25.0000 mg | ORAL_TABLET | Freq: Every day | ORAL | 2 refills | Status: DC

## 2020-08-08 NOTE — Progress Notes (Signed)
Cardiology Office Note:    Date:  08/08/2020   ID:  Juan Hughes, DOB 07/28/1955, MRN [id number]  PCP:  Gorden Harms, PA-C  Cardiologist:  Debbe Odea, MD  Electrophysiologist:  None   Referring MD: Gorden Harms, PA-C   Chief Complaint  Patient presents with  . Other    6 month follow up. Meds reviewed verbally with patient.     History of Present Illness:   Spanish interpreter used for this visit.  Juan Hughes is a 65 y.o. male with a hx of hypertension, heart failure reduced ejection fraction, EF 40 to 45%, CVA who presents for follow-up.    Being seen for cardiomyopathy and hypertension.  States doing okay, denies chest pain or shortness of breath, needs refills for his medications.  Does not check his blood pressure frequently at home.  Aldactone was started after last visit.  Prior notes Patient admitted to the hospital on 06/23/2019 with left-sided weakness.  Eventually found to have right lacunar infarct on brain MRI.  Work-up with echocardiogram showed reduced ejection fraction with EF 40 to 45%.  Pharmacologic myocardial perfusion stress test did not show any ischemia or scar.  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  Coreg, losartan, hydralazine was started.  Cardiac monitor was placed and did not show any evidence of atrial flutter or atrial fibrillation.    Past Medical History:  Diagnosis Date  . CHF (congestive heart failure) (HCC)    last ef 40-45% echo 05/2019  . GERD (gastroesophageal reflux disease)   . HFrEF (heart failure with reduced ejection fraction) (HCC)   . Hypertension   . Osteoarthritis of right knee   . Stroke [facility]) 06/2019   left sided weakness    Past Surgical History:  Procedure Laterality Date  . INNER EAR SURGERY     as teenager  . TOTAL KNEE ARTHROPLASTY Right 02/24/2020   Procedure: RIGHT TOTAL KNEE ARTHROPLASTY;  Surgeon: Tarry Kos, MD;   Location: [facility];  Service: Orthopedics;  Laterality: Right;    Current Medications: Current Meds  Medication Sig  . acetaminophen (TYLENOL) 500 MG tablet Tylenol  500mg  q6hrs  . aspirin EC 81 MG tablet Take 1 tablet (81 mg total) by mouth daily. Swallow whole.  . hydrALAZINE (APRESOLINE) 50 MG tablet Take 1 tablet (50 mg total) by mouth 3 (three) times daily.  . Melatonin 1 MG CAPS Take 3 mg by mouth at bedtime.   . [DISCONTINUED] aspirin 325 MG tablet Take 325 mg by mouth daily.  . [DISCONTINUED] atorvastatin (LIPITOR) 80 MG tablet Take 1 tablet (80 mg total) by mouth daily at 6 PM.  . [DISCONTINUED] carvedilol (COREG) 25 MG tablet Take 1 tablet (25 mg total) by mouth 2 (two) times daily.  . [DISCONTINUED] docusate sodium (COLACE) 100 MG capsule Take 1 capsule (100 mg total) by mouth daily as needed.  . [DISCONTINUED] hydrALAZINE (APRESOLINE) 100 MG tablet Take 1 tablet (100 mg total) by mouth every 8 (eight) hours.  . [DISCONTINUED] losartan (COZAAR) 100 MG tablet Take 1 tablet (100 mg total) by mouth daily.  . [DISCONTINUED] spironolactone (ALDACTONE) 25 MG tablet Take 1 tablet (25 mg total) by mouth daily.     Allergies:   Patient has no known allergies.   Social History   Socioeconomic History  . Marital status: Married    Spouse name: Not on file  . Number of children: Not on file  . Years of education:  Not on file  . Highest education level: Not on file  Occupational History  . Not on file  Tobacco Use  . Smoking status: Former Smoker    Types: Cigars    Quit date: 06/22/2019    Years since quitting: 1.1  . Smokeless tobacco: Never Used  Vaping Use  . Vaping Use: Never used  Substance and Sexual Activity  . Alcohol use: Never  . Drug use: Never  . Sexual activity: Yes  Other Topics Concern  . Not on file  Social History Narrative  . Not on file   Social Determinants of Health   Financial Resource Strain: Not on file  Food Insecurity: Not on  file  Transportation Needs: Not on file  Physical Activity: Not on file  Stress: Not on file  Social Connections: Not on file     Family History: The patient's family history includes Hypertension in his mother.  ROS:   Please see the history of present illness.     All other systems reviewed and are negative.  EKGs/Labs/Other Studies Reviewed:    The following studies were reviewed today:   EKG:  EKG is  ordered today.  The ekg ordered today demonstrates sinus rhythm, possible LVH per voltage criteria.  Recent Labs: 02/22/2020: ALT 19 02/25/2020: BUN 21; Creatinine, Ser 1.58; Hemoglobin 11.4; Platelets 204; Potassium 4.1; Sodium 140  Recent Lipid Panel    Component Value Date/Time   CHOL 125 02/04/2020 0915   TRIG 209 (H) 02/04/2020 0915   HDL 39 (L) 02/04/2020 0915   CHOLHDL 3.2 02/04/2020 0915   CHOLHDL 4.1 06/19/2019 0422   VLDL 23 06/19/2019 0422   LDLCALC 52 02/04/2020 0915    Physical Exam:    VS:  BP (!) 90/50 (BP Location: Left Arm, Patient Position: Sitting, Cuff Size: Normal)   Pulse 71   Ht 5\' 3"  (1.6 m)   Wt 159 lb (72.1 kg)   SpO2 97%   BMI 28.17 kg/m     Wt Readings from Last 3 Encounters:  08/08/20 159 lb (72.1 kg)  05/19/20 155 lb (70.3 kg)  02/24/20 155 lb 3.3 oz (70.4 kg)     GEN:  Well nourished, well developed in no acute distress HEENT: Normal NECK: No JVD; No carotid bruits LYMPHATICS: No lymphadenopathy CARDIAC: RRR, no murmurs, rubs, gallops RESPIRATORY:  Clear to auscultation without rales, wheezing or rhonchi  ABDOMEN: Soft, non-tender, non-distended MUSCULOSKELETAL:  No edema; No deformity  SKIN: Warm and dry NEUROLOGIC:  Alert and oriented x 3 PSYCHIATRIC:  Normal affect   ASSESSMENT:    1. HFrEF (heart failure with reduced ejection fraction) (HCC)   2. Essential hypertension   3. Cerebrovascular accident (CVA), unspecified mechanism (HCC)    PLAN:    In order of problems listed above:  1. NICM, prior EF 40 to 45%,  echo on 01/2020 showed normalized EF, 55 to 60%.  Prior Myoview with no evidence for ischemia.   Aldactone to 25 mg daily,Coreg 25 mg twice daily, losartan 100.  Previous heart failure likely secondary to uncontrolled hypertension. 2. History of hypertension, blood pressure low today.  Decrease hydralazine to 50 mg 3 times daily.  Continue Coreg, losartan, Aldactone as above.   3. History of CVA, right lacunar infarct.  Decrease aspirin to 81 mg daily, continue Lipitor.   Follow-up in 6 months  Total encounter time 40 minutes  Greater than 50% was spent in counseling and coordination of care with the patient Time spent including but  not limited to medication management, Usage of interpreter services,    This note was generated in part or whole with voice recognition software. Voice recognition is usually quite accurate but there are transcription errors that can and very often do occur. I apologize for any typographical errors that were not detected and corrected.  Medication Adjustments/Labs and Tests Ordered: Current medicines are reviewed at length with the patient today.  Concerns regarding medicines are outlined above.  Orders Placed This Encounter  Procedures  . EKG 12-Lead   Meds ordered this encounter  Medications  . aspirin EC 81 MG tablet    Sig: Take 1 tablet (81 mg total) by mouth daily. Swallow whole.    Dispense:  90 tablet    Refill:  3  . hydrALAZINE (APRESOLINE) 50 MG tablet    Sig: Take 1 tablet (50 mg total) by mouth 3 (three) times daily.    Dispense:  270 tablet    Refill:  3  . carvedilol (COREG) 25 MG tablet    Sig: Take 1 tablet (25 mg total) by mouth 2 (two) times daily.    Dispense:  180 tablet    Refill:  2  . atorvastatin (LIPITOR) 80 MG tablet    Sig: Take 1 tablet (80 mg total) by mouth daily at 6 PM.    Dispense:  90 tablet    Refill:  2  . losartan (COZAAR) 100 MG tablet    Sig: Take 1 tablet (100 mg total) by mouth daily.    Dispense:  90 tablet     Refill:  2  . spironolactone (ALDACTONE) 25 MG tablet    Sig: Take 1 tablet (25 mg total) by mouth daily.    Dispense:  90 tablet    Refill:  2    Patient Instructions  Medication Instructions:  Your physician has recommended you make the following change in your medication:  1- DECREASE Aspirin 81 mg by mouth once a day. 2- DECREASE Hydralazine to 50 mg by mouth three times a day.  *If you need a refill on your cardiac medications before your next appointment, please call your pharmacy*  Lab Work: none If you have labs (blood work) drawn today and your tests are completely normal, you will receive your results only by: Marland Kitchen MyChart Message (if you have MyChart) OR . A paper copy in the mail If you have any lab test that is abnormal or we need to change your treatment, we will call you to review the results.  Testing/Procedures: none  Follow-Up: At [facility], you and your health needs are our priority.  As part of our continuing mission to provide you with exceptional heart care, we have created designated Provider Care Teams.  These Care Teams include your primary Cardiologist (physician) and Advanced Practice Providers (APPs -  Physician Assistants and Nurse Practitioners) who all work together to provide you with the care you need, when you need it.  We recommend signing up for the patient portal called "MyChart".  Sign up information is provided on this After Visit Summary.  MyChart is used to connect with patients for Virtual Visits (Telemedicine).  Patients are able to view lab/test results, encounter notes, upcoming appointments, etc.  Non-urgent messages can be sent to your provider as well.   To learn more about what you can do with MyChart, go to [email or web address].    Your next appointment:   6 month(s)  The format for your next appointment:   In Person  Provider:   You may see Debbe Odea, MD or one of the following Advanced Practice Providers on  your designated Care Team:    Nicolasa Ducking, NP  Eula Listen, PA-C  Marisue Ivan, PA-C  Cadence [place], [place]  Gillian Shields, NP     Signed, Debbe Odea, MD  08/08/2020 9:39 AM     [facility]

## 2020-08-08 NOTE — Patient Instructions (Signed)
Medication Instructions:  Your physician has recommended you make the following change in your medication:  1- DECREASE Aspirin 81 mg by mouth once a day. 2- DECREASE Hydralazine to 50 mg by mouth three times a day.  *If you need a refill on your cardiac medications before your next appointment, please call your pharmacy*  Lab Work: none If you have labs (blood work) drawn today and your tests are completely normal, you will receive your results only by: Marland Kitchen MyChart Message (if you have MyChart) OR . A paper copy in the mail If you have any lab test that is abnormal or we need to change your treatment, we will call you to review the results.  Testing/Procedures: none  Follow-Up: At [facility], you and your health needs are our priority.  As part of our continuing mission to provide you with exceptional heart care, we have created designated Provider Care Teams.  These Care Teams include your primary Cardiologist (physician) and Advanced Practice Providers (APPs -  Physician Assistants and Nurse Practitioners) who all work together to provide you with the care you need, when you need it.  We recommend signing up for the patient portal called "MyChart".  Sign up information is provided on this After Visit Summary.  MyChart is used to connect with patients for Virtual Visits (Telemedicine).  Patients are able to view lab/test results, encounter notes, upcoming appointments, etc.  Non-urgent messages can be sent to your provider as well.   To learn more about what you can do with MyChart, go to [email or web address].    Your next appointment:   6 month(s)  The format for your next appointment:   In Person  Provider:   You may see Debbe Odea, MD or one of the following Advanced Practice Providers on your designated Care Team:    Nicolasa Ducking, NP  Eula Listen, PA-C  Marisue Ivan, PA-C  Cadence [place], [place]  Gillian Shields, NP

## 2020-08-17 ENCOUNTER — Ambulatory Visit (INDEPENDENT_AMBULATORY_CARE_PROVIDER_SITE_OTHER): Payer: Self-pay

## 2020-08-17 ENCOUNTER — Other Ambulatory Visit: Payer: Self-pay

## 2020-08-17 ENCOUNTER — Encounter: Payer: Self-pay | Admitting: Orthopaedic Surgery

## 2020-08-17 ENCOUNTER — Ambulatory Visit (INDEPENDENT_AMBULATORY_CARE_PROVIDER_SITE_OTHER): Payer: Self-pay | Admitting: Orthopaedic Surgery

## 2020-08-17 DIAGNOSIS — Z96651 Presence of right artificial knee joint: Secondary | ICD-10-CM

## 2020-08-17 NOTE — Progress Notes (Signed)
   Post-Op Visit Note   Patient: Juan Hughes           Date of Birth: 06-13-1955           MRN: [id number] Visit Date: 08/17/2020 PCP: Gorden Harms, PA-C   Assessment & Plan:  Chief Complaint:  Chief Complaint  Patient presents with  . Right Knee - Follow-up   Visit Diagnoses:  1. History of total knee replacement, right     Plan: Pleasant 65 year old Spanish-speaking gentleman who is here today with an interpreter.  He is 6 months status post right total knee replacement.  He has been doing well.  He has minimal to no pain.  He has regained full range of motion.  Examination of his right knee reveals range of motion from 0 to 120 degrees.  Stable valgus varus stress.  He is neurovascularly intact distally.  At this point, he will continue to advance with activity as tolerated.  Dental prophylaxis reinforced.  Follow-up with [place] in 3 months time for recheck.  At that point, he would like to discuss scheduling left total knee replacement.  Follow-Up Instructions: Return in about 3 months (around 11/17/2020).   Orders:  Orders Placed This Encounter  Procedures  . XR Knee 1-2 Views Right   No orders of the defined types were placed in this encounter.   Imaging: No results found.  PMFS History: Patient Active Problem List   Diagnosis Date Noted  . 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 Osteoarthritis of right knee 02/23/2020  . Nonischemic cardiomyopathy (HCC)   . HFrEF (heart failure with reduced ejection fraction) (HCC)   . Stroke (HCC) 06/18/2019  . Hypertension   . Left-sided weakness   . Hypokalemia   . Tobacco abuse    Past Medical History:  Diagnosis Date  . CHF (congestive heart failure) (HCC)    last ef 40-45% echo 05/2019  . GERD (gastroesophageal reflux disease)   . HFrEF (heart failure with reduced ejection fraction) (HCC)   . Hypertension   . Osteoarthritis of right knee   . Stroke [facility]) 06/2019   left sided weakness     Family History  Problem Relation Age of Onset  . Hypertension Mother     Past Surgical History:  Procedure Laterality Date  . INNER EAR SURGERY     as teenager  . TOTAL KNEE ARTHROPLASTY Right 02/24/2020   Procedure: RIGHT TOTAL KNEE ARTHROPLASTY;  Surgeon: Tarry Kos, MD;  Location: [facility];  Service: Orthopedics;  Laterality: Right;   Social History   Occupational History  . Not on file  Tobacco Use  . Smoking status: Former Smoker    Types: Cigars    Quit date: 06/22/2019    Years since quitting: 1.1  . Smokeless tobacco: Never Used  Vaping Use  . Vaping Use: Never used  Substance and Sexual Activity  . Alcohol use: Never  . Drug use: Never  . Sexual activity: Yes

## 2020-11-17 ENCOUNTER — Ambulatory Visit (INDEPENDENT_AMBULATORY_CARE_PROVIDER_SITE_OTHER): Payer: Self-pay | Admitting: Orthopaedic Surgery

## 2020-11-17 ENCOUNTER — Other Ambulatory Visit: Payer: Self-pay

## 2020-11-17 ENCOUNTER — Encounter: Payer: Self-pay | Admitting: Orthopaedic Surgery

## 2020-11-17 ENCOUNTER — Encounter: Payer: Self-pay | Admitting: Cardiology

## 2020-11-17 DIAGNOSIS — Z96651 Presence of right artificial knee joint: Secondary | ICD-10-CM

## 2020-11-17 NOTE — Progress Notes (Signed)
   Post-Op Visit Note   Patient: Juan Hughes           Date of Birth: 09-22-1955           MRN: [id number] Visit Date: 11/17/2020 PCP: Gorden Harms, PA-C   Assessment & Plan:  Chief Complaint:  Chief Complaint  Patient presents with   Right Knee - Follow-up    Visit Diagnoses:  1. Hx of total knee replacement, right     Plan: Patient is a pleasant 65 year old gentleman who comes in today 9 months out right total knee replacement 02/24/2020.  He has been doing well.  He has no complaints.  Examination of the right knee reveals a fully healed surgical scar without complication.  Range of motion 0 to 125 degrees.  Stable valgus varus stress.  At this point he will continue to advance with activity as tolerated.  Dental prophylaxis reinforced.  Follow-up with [place] in 3 months time for repeat evaluation and 2 view x-rays of the right knee.  Call with concerns or questions in the meantime.  Follow-Up Instructions: Return in about 3 months (around 02/17/2021).   Orders:  No orders of the defined types were placed in this encounter.  No orders of the defined types were placed in this encounter.   Imaging: No new imaging  PMFS History: Patient Active Problem List   Diagnosis Date Noted   Status post total knee replacement, right 02/24/2020   Osteoarthritis of right knee 02/23/2020   Nonischemic cardiomyopathy (HCC)    HFrEF (heart failure with reduced ejection fraction) (HCC)    Stroke (HCC) 06/18/2019   Hypertension    Left-sided weakness    Hypokalemia    Tobacco abuse    Past Medical History:  Diagnosis Date   CHF (congestive heart failure) (HCC)    last ef 40-45% echo 05/2019   GERD (gastroesophageal reflux disease)    HFrEF (heart failure with reduced ejection fraction) (HCC)    Hypertension    Osteoarthritis of right knee    Stroke (HCC) 06/2019   left sided weakness    Family History  Problem Relation Age of Onset   Hypertension Mother     Past  Surgical History:  Procedure Laterality Date   INNER EAR SURGERY     as teenager   TOTAL KNEE ARTHROPLASTY Right 02/24/2020   Procedure: RIGHT TOTAL KNEE ARTHROPLASTY;  Surgeon: Tarry Kos, MD;  Location: [facility];  Service: Orthopedics;  Laterality: Right;   Social History   Occupational History   Not on file  Tobacco Use   Smoking status: Former    Pack years: 0.00    Types: Cigars    Quit date: 06/22/2019    Years since quitting: 1.4   Smokeless tobacco: Never  Vaping Use   Vaping Use: Never used  Substance and Sexual Activity   Alcohol use: Never   Drug use: Never   Sexual activity: Yes

## 2021-02-09 ENCOUNTER — Encounter: Payer: Self-pay | Admitting: Cardiology

## 2021-02-09 ENCOUNTER — Other Ambulatory Visit: Payer: Self-pay

## 2021-02-09 ENCOUNTER — Ambulatory Visit (INDEPENDENT_AMBULATORY_CARE_PROVIDER_SITE_OTHER): Payer: Self-pay | Admitting: Cardiology

## 2021-02-09 VITALS — BP 128/80 | HR 59 | Ht 62.0 in | Wt 173.0 lb

## 2021-02-09 DIAGNOSIS — I428 Other cardiomyopathies: Secondary | ICD-10-CM

## 2021-02-09 DIAGNOSIS — Z8673 Personal history of transient ischemic attack (TIA), and cerebral infarction without residual deficits: Secondary | ICD-10-CM

## 2021-02-09 DIAGNOSIS — I10 Essential (primary) hypertension: Secondary | ICD-10-CM

## 2021-02-09 MED ORDER — LOSARTAN POTASSIUM 100 MG PO TABS: 100.0000 mg | ORAL_TABLET | Freq: Every day | ORAL | 2 refills | Status: DC

## 2021-02-09 NOTE — Patient Instructions (Signed)
Medication Instructions:  Your physician recommends that you continue on your current medications as directed. Please refer to the Current Medication list given to you today.  *If you need a refill on your cardiac medications before your next appointment, please call your pharmacy*   Lab Work: None ordered If you have labs (blood work) drawn today and your tests are completely normal, you will receive your results only by: MyChart Message (if you have MyChart) OR A paper copy in the mail If you have any lab test that is abnormal or we need to change your treatment, we will call you to review the results.   Testing/Procedures:  Your physician has requested that you have an echocardiogram. Echocardiography is a painless test that uses sound waves to create images of your heart. It provides your doctor with information about the size and shape of your heart and how well your heart's chambers and valves are working. This procedure takes approximately one hour. There are no restrictions for this procedure.    Follow-Up: At [facility], you and your health needs are our priority.  As part of our continuing mission to provide you with exceptional heart care, we have created designated Provider Care Teams.  These Care Teams include your primary Cardiologist (physician) and Advanced Practice Providers (APPs -  Physician Assistants and Nurse Practitioners) who all work together to provide you with the care you need, when you need it.  We recommend signing up for the patient portal called "MyChart".  Sign up information is provided on this After Visit Summary.  MyChart is used to connect with patients for Virtual Visits (Telemedicine).  Patients are able to view lab/test results, encounter notes, upcoming appointments, etc.  Non-urgent messages can be sent to your provider as well.   To learn more about what you can do with MyChart, go to [email or web address].    Your next appointment:   1  year(s)  The format for your next appointment:   In Person  Provider:   Debbe Odea, MD   Other Instructions

## 2021-02-09 NOTE — Progress Notes (Signed)
Cardiology Office Note:    Date:  02/09/2021   ID:  Juan Hughes, DOB 03/17/1956, MRN [id number]  PCP:  Gorden Harms, PA-C  Cardiologist:  Debbe Odea, MD  Electrophysiologist:  None   Referring MD: Gorden Harms, PA-C   Chief Complaint  Patient presents with   Other    6 month follow up -- Patient c.o some swelling in the ankle. Meds reviewed verbally with patient.     History of Present Illness:   Spanish interpreter used for this visit.  Kamaury Declan Mier is a 65 y.o. male with a hx of hypertension, NICM, (initial EF 40 to 45%), EF normalized on subsequent echo, CVA 06/2019 who presents for follow-up.    Being seen for cardiomyopathy and hypertension.  Blood pressures have been well controlled on current therapy involving Coreg, losartan, Aldactone, hydralazine.  He has left knee pain, thinks he will need surgery in the near future.  Currently wearing a knee brace to help with pain and support.  Denies chest pain, has occasional lower extremity swelling in the left leg.  No swelling noted today.  Checks his blood pressure frequently at home, BP has been well controlled.  Prior notes Patient admitted to the hospital on 06/23/2019 with left-sided weakness.  Eventually found to have right lacunar infarct on brain MRI.   echocardiogram showed reduced ejection fraction with EF 40 to 45%.   Pharmacologic myocardial perfusion stress test did not show any ischemia or scar.   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   Coreg, losartan, hydralazine was started.   Cardiac monitor was placed and did not show any evidence of atrial flutter or atrial fibrillation.    Past Medical History:  Diagnosis Date   CHF (congestive heart failure) (HCC)    last ef 40-45% echo 05/2019   GERD (gastroesophageal reflux disease)    HFrEF (heart failure with reduced ejection fraction) (HCC)    Hypertension     Osteoarthritis of right knee    Stroke (HCC) 06/2019   left sided weakness    Past Surgical History:  Procedure Laterality Date   INNER EAR SURGERY     as teenager   TOTAL KNEE ARTHROPLASTY Right 02/24/2020   Procedure: RIGHT TOTAL KNEE ARTHROPLASTY;  Surgeon: Tarry Kos, MD;  Location: [facility];  Service: Orthopedics;  Laterality: Right;    Current Medications: Current Meds  Medication Sig   acetaminophen (TYLENOL) 500 MG tablet Tylenol  500mg  q6hrs   aspirin EC 81 MG tablet Take 1 tablet (81 mg total) by mouth daily. Swallow whole.   atorvastatin (LIPITOR) 80 MG tablet Take 1 tablet (80 mg total) by mouth daily at 6 PM.   carvedilol (COREG) 25 MG tablet Take 1 tablet (25 mg total) by mouth 2 (two) times daily.   hydrALAZINE (APRESOLINE) 50 MG tablet Take 1 tablet (50 mg total) by mouth 3 (three) times daily.   spironolactone (ALDACTONE) 25 MG tablet Take 1 tablet (25 mg total) by mouth daily.   [DISCONTINUED] losartan (COZAAR) 100 MG tablet Take 1 tablet (100 mg total) by mouth daily.     Allergies:   Patient has no known allergies.   Social History   Socioeconomic History   Marital status: Married    Spouse name: Not on file   Number of children: Not on file   Years of education: Not on file   Highest education level: Not on file  Occupational History  Not on file  Tobacco Use   Smoking status: Former    Types: Cigars    Quit date: 06/22/2019    Years since quitting: 1.6   Smokeless tobacco: Never  Vaping Use   Vaping Use: Never used  Substance and Sexual Activity   Alcohol use: Never   Drug use: Never   Sexual activity: Yes  Other Topics Concern   Not on file  Social History Narrative   Not on file   Social Determinants of Health   Financial Resource Strain: Not on file  Food Insecurity: Not on file  Transportation Needs: Not on file  Physical Activity: Not on file  Stress: Not on file  Social Connections: Not on file     Family  History: The patient's family history includes Hypertension in his mother.  ROS:   Please see the history of present illness.     All other systems reviewed and are negative.  EKGs/Labs/Other Studies Reviewed:    The following studies were reviewed today:   EKG:  EKG is  ordered today.  The ekg ordered today demonstrates sinus bradycardia,  Recent Labs: 02/22/2020: ALT 19 02/25/2020: BUN 21; Creatinine, Ser 1.58; Hemoglobin 11.4; Platelets 204; Potassium 4.1; Sodium 140  Recent Lipid Panel    Component Value Date/Time   CHOL 125 02/04/2020 0915   TRIG 209 (H) 02/04/2020 0915   HDL 39 (L) 02/04/2020 0915   CHOLHDL 3.2 02/04/2020 0915   CHOLHDL 4.1 06/19/2019 0422   VLDL 23 06/19/2019 0422   LDLCALC 52 02/04/2020 0915    Physical Exam:    VS:  BP 128/80 (BP Location: Right Arm, Patient Position: Sitting, Cuff Size: Normal)   Pulse (!) 59   Ht 5\' 2"  (1.575 m)   Wt 173 lb (78.5 kg)   SpO2 96%   BMI 31.64 kg/m     Wt Readings from Last 3 Encounters:  02/09/21 173 lb (78.5 kg)  08/08/20 159 lb (72.1 kg)  05/19/20 155 lb (70.3 kg)     GEN:  Well nourished, well developed in no acute distress HEENT: Normal NECK: No JVD; No carotid bruits LYMPHATICS: No lymphadenopathy CARDIAC: RRR, no murmurs, rubs, gallops RESPIRATORY:  Clear to auscultation without rales, wheezing or rhonchi  ABDOMEN: Soft, non-tender, non-distended MUSCULOSKELETAL:  No edema; No deformity  SKIN: Warm and dry NEUROLOGIC:  Alert and oriented x 3 PSYCHIATRIC:  Normal affect   ASSESSMENT:    1. NICM (nonischemic cardiomyopathy) (HCC)   2. Primary hypertension   3. History of stroke     PLAN:    In order of problems listed above:  NICM, possibly from uncontrolled hypertension.  (Echo 05/2019, EF 40 to 45%), echo on 01/2020 showed normalized EF, 55 to 60%.  Myoview 2021 with no evidence for ischemia.   Continue Coreg, losartan, Aldactone.  Repeat echocardiogram.  Planning for left knee surgery in  the near future.  If echo is without gross abnormalities, okay to proceed with surgical procedure from a cardiac perspective. History of hypertension, blood pressure controlled.  Continue Coreg, losartan, Aldactone, hydralazine. History of CVA, right lacunar infarct.  aspirin to 81 mg daily, continue Lipitor.   Follow-up in 1 year  Total encounter time 40 minutes  Greater than 50% was spent in counseling and coordination of care with the patient Time spent including but not limited to medication management, Usage of interpreter services,    This note was generated in part or whole with voice recognition software. Voice recognition is usually  quite accurate but there are transcription errors that can and very often do occur. I apologize for any typographical errors that were not detected and corrected.  Medication Adjustments/Labs and Tests Ordered: Current medicines are reviewed at length with the patient today.  Concerns regarding medicines are outlined above.  Orders Placed This Encounter  Procedures   EKG 12-Lead   ECHOCARDIOGRAM COMPLETE    Meds ordered this encounter  Medications   losartan (COZAAR) 100 MG tablet    Sig: Take 1 tablet (100 mg total) by mouth daily.    Dispense:  90 tablet    Refill:  2     Patient Instructions  Medication Instructions:  Your physician recommends that you continue on your current medications as directed. Please refer to the Current Medication list given to you today.  *If you need a refill on your cardiac medications before your next appointment, please call your pharmacy*   Lab Work: None ordered If you have labs (blood work) drawn today and your tests are completely normal, you will receive your results only by: MyChart Message (if you have MyChart) OR A paper copy in the mail If you have any lab test that is abnormal or we need to change your treatment, we will call you to review the results.   Testing/Procedures:  Your physician  has requested that you have an echocardiogram. Echocardiography is a painless test that uses sound waves to create images of your heart. It provides your doctor with information about the size and shape of your heart and how well your heart's chambers and valves are working. This procedure takes approximately one hour. There are no restrictions for this procedure.    Follow-Up: At [facility], you and your health needs are our priority.  As part of our continuing mission to provide you with exceptional heart care, we have created designated Provider Care Teams.  These Care Teams include your primary Cardiologist (physician) and Advanced Practice Providers (APPs -  Physician Assistants and Nurse Practitioners) who all work together to provide you with the care you need, when you need it.  We recommend signing up for the patient portal called "MyChart".  Sign up information is provided on this After Visit Summary.  MyChart is used to connect with patients for Virtual Visits (Telemedicine).  Patients are able to view lab/test results, encounter notes, upcoming appointments, etc.  Non-urgent messages can be sent to your provider as well.   To learn more about what you can do with MyChart, go to [email or web address].    Your next appointment:   1 year(s)  The format for your next appointment:   In Person  Provider:   Debbe Odea, MD   Other Instructions    Signed, Debbe Odea, MD  02/09/2021 10:59 AM    [facility]

## 2021-02-16 ENCOUNTER — Encounter: Payer: Self-pay | Admitting: Orthopaedic Surgery

## 2021-02-16 ENCOUNTER — Ambulatory Visit (INDEPENDENT_AMBULATORY_CARE_PROVIDER_SITE_OTHER): Payer: Self-pay | Admitting: Orthopaedic Surgery

## 2021-02-16 ENCOUNTER — Ambulatory Visit: Payer: Self-pay

## 2021-02-16 ENCOUNTER — Other Ambulatory Visit: Payer: Self-pay

## 2021-02-16 DIAGNOSIS — M1712 Unilateral primary osteoarthritis, left knee: Secondary | ICD-10-CM

## 2021-02-16 DIAGNOSIS — Z96651 Presence of right artificial knee joint: Secondary | ICD-10-CM | POA: Insufficient documentation

## 2021-02-16 NOTE — Addendum Note (Signed)
Addended by: Mayra Reel on: 02/16/2021 10:27 AM   Modules accepted: Level of Service

## 2021-02-16 NOTE — Progress Notes (Signed)
Office Visit Note   Patient: Juan Hughes           Date of Birth: Jul 29, 1955           MRN: [id number] Visit Date: 02/16/2021              Requested by: Gorden Harms, PA-C [address] [place],  [place] [address] PCP: Gorden Harms, PA-C   Assessment & Plan: Visit Diagnoses:  1. Hx of total knee replacement, right   2. Primary osteoarthritis of left knee     Plan: The patient has done very well from his right knee replacement he is very happy.  Dental prophylaxis was reinforced.  Activity as tolerated.  In terms of the left knee he is ready to undergo knee replacement for the near future.  We will look to schedule surgery for sometime in November or December pending [facility] financial assistance approval.  Follow-Up Instructions: No follow-ups on file.   Orders:  Orders Placed This Encounter  Procedures   XR Knee 1-2 Views Right   XR KNEE 3 VIEW LEFT   No orders of the defined types were placed in this encounter.     Procedures: No procedures performed   Clinical Data: No additional findings.   Subjective: Chief Complaint  Patient presents with   Right Knee - Follow-up    Right total knee arthroplasty 02/24/2020    Patient is a 65 year old Hispanic gentleman here with his wife and interpreter for follow-up of both knees.  He is 1 year status post right total knee replacement from which she has done very well.  He has no complaints.  He is currently wearing a stabilizing brace for the left knee and is interested in scheduling a left knee replacement in the near future.  He recently submitted an application for, financial assistance.   Review of Systems  Constitutional: Negative.   All other systems reviewed and are negative.   Objective: Vital Signs: There were no vitals taken for this visit.  Physical Exam Vitals and nursing note reviewed.  Constitutional:      Appearance: He is well-developed.  Pulmonary:     Effort: Pulmonary effort is  normal.  Abdominal:     Palpations: Abdomen is soft.  Skin:    General: Skin is warm.  Neurological:     Mental Status: He is alert and oriented to person, place, and time.  Psychiatric:        Behavior: Behavior normal.        Thought Content: Thought content normal.        Judgment: Judgment normal.    Ortho Exam  Right knee examination shows a fully healed surgical scar.  Excellent range of motion.  Slight varus jog.  Stable to valgus stress.  No joint effusion.  Left knee shows a considerable varus deformity.  Limitation range of motion past 100 degrees.  Collaterals and cruciates are stable.  Considerable patellofemoral crepitus with range of motion.  Specialty Comments:  No specialty comments available.  Imaging: XR Knee 1-2 Views Right  Result Date: 02/16/2021 Stable total knee replacement in good alignment   XR KNEE 3 VIEW LEFT  Result Date: 02/16/2021 Severe degenerative joint disease with significant varus deformity.    PMFS History: Patient Active Problem List   Diagnosis Date Noted   Hx of total knee replacement, right 02/16/2021   Primary osteoarthritis of left knee 02/16/2021   Status post total knee replacement, right 02/24/2020   Osteoarthritis of right  knee 02/23/2020   Nonischemic cardiomyopathy (HCC)    HFrEF (heart failure with reduced ejection fraction) (HCC)    Stroke (HCC) 06/18/2019   Hypertension    Left-sided weakness    Hypokalemia    Tobacco abuse    Past Medical History:  Diagnosis Date   CHF (congestive heart failure) (HCC)    last ef 40-45% echo 05/2019   GERD (gastroesophageal reflux disease)    HFrEF (heart failure with reduced ejection fraction) (HCC)    Hypertension    Osteoarthritis of right knee    Stroke (HCC) 06/2019   left sided weakness    Family History  Problem Relation Age of Onset   Hypertension Mother     Past Surgical History:  Procedure Laterality Date   INNER EAR SURGERY     as teenager   TOTAL KNEE  ARTHROPLASTY Right 02/24/2020   Procedure: RIGHT TOTAL KNEE ARTHROPLASTY;  Surgeon: Tarry Kos, MD;  Location: [facility];  Service: Orthopedics;  Laterality: Right;   Social History   Occupational History   Not on file  Tobacco Use   Smoking status: Former    Types: Cigars    Quit date: 06/22/2019    Years since quitting: 1.6   Smokeless tobacco: Never  Vaping Use   Vaping Use: Never used  Substance and Sexual Activity   Alcohol use: Never   Drug use: Never   Sexual activity: Yes

## 2021-03-16 ENCOUNTER — Other Ambulatory Visit: Payer: Self-pay

## 2021-03-16 ENCOUNTER — Ambulatory Visit (INDEPENDENT_AMBULATORY_CARE_PROVIDER_SITE_OTHER): Payer: Self-pay

## 2021-03-16 DIAGNOSIS — I428 Other cardiomyopathies: Secondary | ICD-10-CM

## 2021-03-16 LAB — ECHOCARDIOGRAM COMPLETE
AR max vel: 2.86 cm2
AV Area VTI: 2.97 cm2
AV Area mean vel: 2.85 cm2
AV Mean grad: 5 mmHg
AV Peak grad: 8.9 mmHg
Ao pk vel: 1.49 m/s
Area-P 1/2: 2.31 cm2
Calc EF: 60.2 %
S' Lateral: 3.2 cm
Single Plane A2C EF: 61.7 %
Single Plane A4C EF: 61.2 %

## 2021-04-27 ENCOUNTER — Other Ambulatory Visit: Payer: Self-pay

## 2021-04-27 ENCOUNTER — Encounter (HOSPITAL_COMMUNITY): Payer: Self-pay

## 2021-04-27 ENCOUNTER — Encounter (HOSPITAL_COMMUNITY)
Admission: RE | Admit: 2021-04-27 | Discharge: 2021-04-27 | Disposition: A | Discharge status: Home / Self Care | Payer: Self-pay | Source: Ambulatory Visit | Attending: Orthopaedic Surgery | Admitting: Orthopaedic Surgery

## 2021-04-27 VITALS — BP 123/79 | HR 76 | Temp 98.6°F | Resp 18 | Ht 62.0 in | Wt 174.1 lb

## 2021-04-27 DIAGNOSIS — M1712 Unilateral primary osteoarthritis, left knee: Secondary | ICD-10-CM | POA: Insufficient documentation

## 2021-04-27 DIAGNOSIS — Z01812 Encounter for preprocedural laboratory examination: Secondary | ICD-10-CM | POA: Insufficient documentation

## 2021-04-27 DIAGNOSIS — Z01818 Encounter for other preprocedural examination: Secondary | ICD-10-CM

## 2021-04-27 DIAGNOSIS — Z20822 Contact with and (suspected) exposure to covid-19: Secondary | ICD-10-CM | POA: Insufficient documentation

## 2021-04-27 LAB — URINALYSIS, ROUTINE W REFLEX MICROSCOPIC
Bilirubin Urine: NEGATIVE
Glucose, UA: NEGATIVE mg/dL
Hgb urine dipstick: NEGATIVE
Ketones, ur: NEGATIVE mg/dL
Leukocytes,Ua: NEGATIVE
Nitrite: NEGATIVE
Protein, ur: NEGATIVE mg/dL
Specific Gravity, Urine: <1.005 — ABNORMAL LOW (ref 1.005–1.030)
pH: 6 (ref 5.0–8.0)

## 2021-04-27 LAB — CBC WITH DIFFERENTIAL/PLATELET
Abs Immature Granulocytes: 0.01 10*3/uL (ref 0.00–0.07)
Basophils Absolute: 0.1 10*3/uL (ref 0.0–0.1)
Basophils Relative: 1 %
Eosinophils Absolute: 0.2 10*3/uL (ref 0.0–0.5)
Eosinophils Relative: 4 %
HCT: 45.4 % (ref 39.0–52.0)
Hemoglobin: 13.6 g/dL (ref 13.0–17.0)
Immature Granulocytes: 0 %
Lymphocytes Relative: 28 %
Lymphs Abs: 1.5 10*3/uL (ref 0.7–4.0)
MCH: 27.5 pg (ref 26.0–34.0)
MCHC: 30 g/dL (ref 30.0–36.0)
MCV: 91.7 fL (ref 80.0–100.0)
Monocytes Absolute: 0.5 10*3/uL (ref 0.1–1.0)
Monocytes Relative: 9 %
Neutro Abs: 3.1 10*3/uL (ref 1.7–7.7)
Neutrophils Relative %: 58 %
Platelets: 253 10*3/uL (ref 150–400)
RBC: 4.95 MIL/uL (ref 4.22–5.81)
RDW: 14 % (ref 11.5–15.5)
WBC: 5.3 10*3/uL (ref 4.0–10.5)
nRBC: 0 % (ref 0.0–0.2)

## 2021-04-27 LAB — COMPREHENSIVE METABOLIC PANEL
ALT: 21 U/L (ref 0–44)
AST: 19 U/L (ref 15–41)
Albumin: 3.9 g/dL (ref 3.5–5.0)
Alkaline Phosphatase: 90 U/L (ref 38–126)
Anion gap: 8 (ref 5–15)
BUN: 18 mg/dL (ref 8–23)
CO2: 23 mmol/L (ref 22–32)
Calcium: 9.4 mg/dL (ref 8.9–10.3)
Chloride: 108 mmol/L (ref 98–111)
Creatinine, Ser: 1.89 mg/dL — ABNORMAL HIGH (ref 0.61–1.24)
GFR, Estimated: 39 mL/min — ABNORMAL LOW (ref >60)
Glucose, Bld: 99 mg/dL (ref 70–99)
Potassium: 4.3 mmol/L (ref 3.5–5.1)
Sodium: 139 mmol/L (ref 135–145)
Total Bilirubin: 0.7 mg/dL (ref 0.3–1.2)
Total Protein: 7.3 g/dL (ref 6.5–8.1)

## 2021-04-27 LAB — PROTIME-INR
INR: 1 (ref 0.8–1.2)
Prothrombin Time: 13.2 seconds (ref 11.4–15.2)

## 2021-04-27 LAB — SURGICAL PCR SCREEN
MRSA, PCR: NEGATIVE
Staphylococcus aureus: POSITIVE — AB

## 2021-04-27 LAB — APTT: aPTT: 33 seconds (ref 24–36)

## 2021-04-27 NOTE — Progress Notes (Addendum)
PCP - Dr. Merton Border  Cardiologist - Dr Colbert CoyerDion Saucier  EP-no  Endocrine-no  Pulm-no  Chest x-ray - na  EKG - 02/09/21  Stress Test - 2021  ECHO - 03/16/21  Cardiac Cath - no  AICD-no PM-no LOOP-no  Dialysis-no  Sleep Study - no CPAP - no  LABS-CBC with diff, CMP, PT, PTT, UA, PCP, Covid test.  ASA- no instructions- patient stopped it a week ahead as he did last year when he had Knee replacement  ERAS-yes  HA1C-na Fasting Blood Sugar - na Checks Blood Sugar __0___ times a day  Anesthesia-  Pt denies having chest pain, sob, or fever at this time. All instructions explained to the pt, with a verbal understanding of the material. Pt agrees to go over the instructions while at home for a better understanding. Pt also instructed to self quarantine after being tested for COVID-19. The opportunity to ask questions was provided.

## 2021-04-27 NOTE — Pre-Procedure Instructions (Signed)
Juan Hughes  04/27/2021     Your procedure is scheduled on Monday- December 05/01/21  Report to [facility], Main Entrance or Entrance "A" at 1018 .                Your surgery or procedure is scheduled to begin at 12:19   Call this number if you have problems the morning of surgery: [phone]  This is the number for the Pre- Surgical Desk.                For any other questions, please call [phone], Monday - Friday 8 AM - 4 PM.   Remember:  Do not eat after midnight., Sunday night  You may drink clear liquids until 9:15 AM .   Clear liquids allowed are:  Water, Juice (non-citric and without pulp - diabetics please choose diet or no sugar options), Carbonated beverages - (diabetics please choose diet or no sugar options), Clear Tea, Black Coffee only (no creamer, milk or cream including half and half), Plain Jell-O only (diabetics please choose diet or no sugar options), Gatorade (diabetics please choose diet or no sugar options), and Plain Popsicles only   Drink the Pre- Surgery Ensure drink between 9:30 AM and 10:15  AM and then nothing else to drink   Take these medicines the morning of surgery with A SIP OF WATER: carvedilol (COREG)  hydrALAZINE (APRESOLINE) omeprazole (PRILOSEC)  If needed: acetaminophen (TYLENOL)  Follow Dr. Warren Danes instructions regarding Aspirin 81 mg. STOP taking Aspirin, Aspirin Products (Goody Powder, Excedrin Migraine), Ibuprofen (Advil), Naproxen (Aleve), Vitamins and Herbal Products (ie Fish Oil).    Special instructions:    [facility]- Preparing For Surgery  Before surgery, you can play an important role. Because skin is not sterile, your skin needs to be as free of germs as possible. You can reduce the number of germs on your skin by washing with CHG (chlorahexidine gluconate) Soap before surgery.  CHG is an antiseptic cleaner which kills germs and bonds with the skin to continue killing germs even after washing.     Oral Hygiene is also important to reduce your risk of infection.  Remember - BRUSH YOUR TEETH THE MORNING OF SURGERY WITH YOUR REGULAR TOOTHPASTE  Please do not use if you have an allergy to CHG or antibacterial soaps. If your skin becomes reddened/irritated stop using the CHG.  Do not shave (including legs and underarms) for at least 48 hours prior to first CHG shower. It is OK to shave your face.  Please follow these instructions carefully.   Shower the NIGHT BEFORE SURGERY and the MORNING OF SURGERY with CHG.   If you chose to wash your hair, wash your hair first as usual with your normal shampoo.  After you shampoo, wash your face and private area with the soap you use at home, then rinse your hair and body thoroughly to remove the shampoo and soap.  Use CHG as you would any other liquid soap. You can apply CHG directly to the skin and wash gently with a scrungie or a clean washcloth.   Apply the CHG Soap to your body ONLY FROM THE NECK DOWN.  Do not use on open wounds or open sores. Avoid contact with your eyes, ears, mouth and genitals (private parts).   Wash thoroughly, paying special attention to the area where your surgery will be performed.  Thoroughly rinse your body with warm water from the neck down.  DO NOT  shower/wash with your normal soap after using and rinsing off the CHG Soap.  Pat yourself dry with a CLEAN TOWEL.  Wear CLEAN PAJAMAS to bed the night before surgery, wear comfortable clothes the morning of surgery  Place CLEAN SHEETS on your bed the night of your first shower and DO NOT SLEEP WITH PETS.  Day of Surgery: Shower as instructed above. Do not apply any deodorants/lotions, powders or colognes.  Please wear clean clothes to the hospital/surgery center.   Remember to brush your teeth WITH YOUR REGULAR TOOTHPASTE.  Do not wear jewelry, make-up or nail polish.  Do not shave 48 hours prior to surgery.  Men may shave face and neck.  Do not bring  valuables to the hospital.  [facility] is not responsible for any belongings or valuables.  Contacts, dentures or bridgework may not be worn into surgery.  Leave your suitcase in the car.  After surgery it may be brought to your room.  For patients admitted to the hospital, discharge time will be determined by your treatment team.  Patients discharged the day of surgery will not be allowed to drive home.   Please read over the fact sheets that you were given.

## 2021-04-28 ENCOUNTER — Other Ambulatory Visit: Payer: Self-pay | Admitting: Physician Assistant

## 2021-04-28 LAB — SARS CORONAVIRUS 2 (TAT 6-24 HRS): SARS Coronavirus 2: NEGATIVE

## 2021-04-28 MED ORDER — OXYCODONE-ACETAMINOPHEN 5-325 MG PO TABS: 1.0000 | ORAL_TABLET | Freq: Four times a day (QID) | ORAL | 0 refills | Status: DC | PRN

## 2021-04-28 MED ORDER — RIVAROXABAN 10 MG PO TABS: 10.0000 mg | ORAL_TABLET | Freq: Two times a day (BID) | ORAL | 0 refills | Status: DC

## 2021-04-28 MED ORDER — METHOCARBAMOL 500 MG PO TABS: 500.0000 mg | ORAL_TABLET | Freq: Two times a day (BID) | ORAL | 0 refills | Status: DC | PRN

## 2021-04-28 MED ORDER — TRANEXAMIC ACID 1000 MG/10ML IV SOLN
2000.0000 mg | INTRAVENOUS | Status: DC
  Filled 2021-04-28: qty 20

## 2021-04-28 MED ORDER — ONDANSETRON HCL 4 MG PO TABS: 4.0000 mg | ORAL_TABLET | Freq: Three times a day (TID) | ORAL | 0 refills | Status: AC | PRN

## 2021-04-28 MED ORDER — DOCUSATE SODIUM 100 MG PO CAPS: 100.0000 mg | ORAL_CAPSULE | Freq: Every day | ORAL | 2 refills | Status: AC | PRN

## 2021-05-01 ENCOUNTER — Ambulatory Visit (HOSPITAL_COMMUNITY): Payer: Self-pay | Admitting: Certified Registered"

## 2021-05-01 ENCOUNTER — Encounter (HOSPITAL_COMMUNITY)
Admission: RE | Disposition: A | Discharge status: Home / Self Care | Payer: Self-pay | Source: Home / Self Care | Attending: Orthopaedic Surgery

## 2021-05-01 ENCOUNTER — Other Ambulatory Visit: Payer: Self-pay

## 2021-05-01 ENCOUNTER — Observation Stay (HOSPITAL_COMMUNITY): Payer: Self-pay

## 2021-05-01 ENCOUNTER — Observation Stay (HOSPITAL_COMMUNITY)
Admission: RE | Admit: 2021-05-01 | Discharge: 2021-05-02 | Disposition: A | Discharge status: Home / Self Care | Payer: Self-pay | Attending: Orthopaedic Surgery | Admitting: Orthopaedic Surgery

## 2021-05-01 ENCOUNTER — Encounter (HOSPITAL_COMMUNITY): Payer: Self-pay | Admitting: Orthopaedic Surgery

## 2021-05-01 DIAGNOSIS — I509 Heart failure, unspecified: Secondary | ICD-10-CM | POA: Insufficient documentation

## 2021-05-01 DIAGNOSIS — Z96652 Presence of left artificial knee joint: Secondary | ICD-10-CM

## 2021-05-01 DIAGNOSIS — M1712 Unilateral primary osteoarthritis, left knee: Principal | ICD-10-CM | POA: Diagnosis present

## 2021-05-01 DIAGNOSIS — Z79899 Other long term (current) drug therapy: Secondary | ICD-10-CM | POA: Insufficient documentation

## 2021-05-01 DIAGNOSIS — M21162 Varus deformity, not elsewhere classified, left knee: Secondary | ICD-10-CM | POA: Insufficient documentation

## 2021-05-01 DIAGNOSIS — R52 Pain, unspecified: Secondary | ICD-10-CM

## 2021-05-01 DIAGNOSIS — I110 Hypertensive heart disease with heart failure: Secondary | ICD-10-CM | POA: Insufficient documentation

## 2021-05-01 DIAGNOSIS — Z7901 Long term (current) use of anticoagulants: Secondary | ICD-10-CM | POA: Insufficient documentation

## 2021-05-01 DIAGNOSIS — Z96651 Presence of right artificial knee joint: Secondary | ICD-10-CM | POA: Insufficient documentation

## 2021-05-01 DIAGNOSIS — Z87891 Personal history of nicotine dependence: Secondary | ICD-10-CM | POA: Insufficient documentation

## 2021-05-01 DIAGNOSIS — Z7982 Long term (current) use of aspirin: Secondary | ICD-10-CM | POA: Insufficient documentation

## 2021-05-01 HISTORY — PX: TOTAL KNEE ARTHROPLASTY: SHX125

## 2021-05-01 SURGERY — ARTHROPLASTY, KNEE, TOTAL
Anesthesia: Regional | Site: Knee | Laterality: Left

## 2021-05-01 MED ORDER — TRANEXAMIC ACID 1000 MG/10ML IV SOLN
INTRAVENOUS | Status: DC | PRN
  Administered 2021-05-01: 2000 mg via TOPICAL

## 2021-05-01 MED ORDER — OXYCODONE HCL 5 MG PO TABS
5.0000 mg | ORAL_TABLET | ORAL | Status: DC | PRN
  Administered 2021-05-02: 10 mg via ORAL
  Filled 2021-05-01: qty 2

## 2021-05-01 MED ORDER — CHLORHEXIDINE GLUCONATE 0.12 % MT SOLN
OROMUCOSAL | Status: AC
  Administered 2021-05-01: 15 mL via OROMUCOSAL
  Filled 2021-05-01: qty 15

## 2021-05-01 MED ORDER — BUPIVACAINE-MELOXICAM ER 400-12 MG/14ML IJ SOLN
INTRAMUSCULAR | Status: DC | PRN
  Administered 2021-05-01: 400 mg

## 2021-05-01 MED ORDER — LACTATED RINGERS IV SOLN: INTRAVENOUS | Status: DC | PRN

## 2021-05-01 MED ORDER — SODIUM CHLORIDE 0.9 % IV SOLN: INTRAVENOUS | Status: DC

## 2021-05-01 MED ORDER — ACETAMINOPHEN 500 MG PO TABS
1000.0000 mg | ORAL_TABLET | Freq: Four times a day (QID) | ORAL | Status: DC
  Administered 2021-05-01 – 2021-05-02 (×2): 1000 mg via ORAL
  Filled 2021-05-01 (×3): qty 2

## 2021-05-01 MED ORDER — METHOCARBAMOL 500 MG PO TABS
500.0000 mg | ORAL_TABLET | Freq: Four times a day (QID) | ORAL | Status: DC | PRN
  Administered 2021-05-01 – 2021-05-02 (×2): 500 mg via ORAL
  Filled 2021-05-01 (×2): qty 1

## 2021-05-01 MED ORDER — DEXAMETHASONE SODIUM PHOSPHATE 10 MG/ML IJ SOLN
INTRAMUSCULAR | Status: DC | PRN
  Administered 2021-05-01: 5 mg

## 2021-05-01 MED ORDER — 0.9 % SODIUM CHLORIDE (POUR BTL) OPTIME
TOPICAL | Status: DC | PRN
  Administered 2021-05-01: 1000 mL

## 2021-05-01 MED ORDER — METOCLOPRAMIDE HCL 5 MG/ML IJ SOLN: 5.0000 mg | Freq: Three times a day (TID) | INTRAMUSCULAR | Status: DC | PRN

## 2021-05-01 MED ORDER — ACETAMINOPHEN 10 MG/ML IV SOLN
INTRAVENOUS | Status: DC | PRN
  Administered 2021-05-01: 1000 mg via INTRAVENOUS

## 2021-05-01 MED ORDER — TRANEXAMIC ACID-NACL 1000-0.7 MG/100ML-% IV SOLN
1000.0000 mg | INTRAVENOUS | Status: AC
  Administered 2021-05-01: 1000 mg via INTRAVENOUS

## 2021-05-01 MED ORDER — PHENYLEPHRINE HCL-NACL 20-0.9 MG/250ML-% IV SOLN
INTRAVENOUS | Status: DC | PRN
  Administered 2021-05-01: 25 ug/min via INTRAVENOUS

## 2021-05-01 MED ORDER — ONDANSETRON HCL 4 MG/2ML IJ SOLN: 4.0000 mg | Freq: Four times a day (QID) | INTRAMUSCULAR | Status: DC | PRN

## 2021-05-01 MED ORDER — KETOROLAC TROMETHAMINE 15 MG/ML IJ SOLN
15.0000 mg | Freq: Four times a day (QID) | INTRAMUSCULAR | Status: DC
  Administered 2021-05-01 – 2021-05-02 (×2): 15 mg via INTRAVENOUS
  Filled 2021-05-01 (×2): qty 1

## 2021-05-01 MED ORDER — MIDAZOLAM HCL 2 MG/2ML IJ SOLN: 2.0000 mg | Freq: Once | INTRAMUSCULAR | Status: DC

## 2021-05-01 MED ORDER — CEFAZOLIN SODIUM-DEXTROSE 2-4 GM/100ML-% IV SOLN
INTRAVENOUS | Status: AC
  Filled 2021-05-01: qty 100

## 2021-05-01 MED ORDER — CHLORHEXIDINE GLUCONATE 0.12 % MT SOLN
15.0000 mL | Freq: Once | OROMUCOSAL | Status: AC
  Filled 2021-05-01: qty 15

## 2021-05-01 MED ORDER — LIDOCAINE HCL (CARDIAC) PF 100 MG/5ML IV SOSY
PREFILLED_SYRINGE | INTRAVENOUS | Status: DC | PRN
  Administered 2021-05-01: 100 mg via INTRATRACHEAL

## 2021-05-01 MED ORDER — GLYCOPYRROLATE 0.2 MG/ML IJ SOLN
INTRAMUSCULAR | Status: DC | PRN
  Administered 2021-05-01: .2 mg via INTRAVENOUS

## 2021-05-01 MED ORDER — RIVAROXABAN 10 MG PO TABS
10.0000 mg | ORAL_TABLET | Freq: Every day | ORAL | Status: DC
  Administered 2021-05-02: 10 mg via ORAL
  Filled 2021-05-01: qty 1

## 2021-05-01 MED ORDER — DEXAMETHASONE SODIUM PHOSPHATE 10 MG/ML IJ SOLN
10.0000 mg | Freq: Once | INTRAMUSCULAR | Status: AC
  Administered 2021-05-02: 10 mg via INTRAVENOUS
  Filled 2021-05-01: qty 1

## 2021-05-01 MED ORDER — OXYCODONE HCL 5 MG PO TABS: 10.0000 mg | ORAL_TABLET | ORAL | Status: DC | PRN

## 2021-05-01 MED ORDER — OXYCODONE HCL ER 10 MG PO T12A
10.0000 mg | EXTENDED_RELEASE_TABLET | Freq: Two times a day (BID) | ORAL | Status: DC
  Administered 2021-05-01 – 2021-05-02 (×2): 10 mg via ORAL
  Filled 2021-05-01 (×2): qty 1

## 2021-05-01 MED ORDER — FENTANYL CITRATE (PF) 100 MCG/2ML IJ SOLN
INTRAMUSCULAR | Status: AC
  Administered 2021-05-01: 25 ug via INTRAVENOUS
  Filled 2021-05-01: qty 2

## 2021-05-01 MED ORDER — FENTANYL CITRATE PF 50 MCG/ML IJ SOSY: 50.0000 ug | PREFILLED_SYRINGE | Freq: Once | INTRAMUSCULAR | Status: DC

## 2021-05-01 MED ORDER — VANCOMYCIN HCL 1000 MG IV SOLR
INTRAVENOUS | Status: AC
  Filled 2021-05-01: qty 20

## 2021-05-01 MED ORDER — METOCLOPRAMIDE HCL 5 MG PO TABS: 5.0000 mg | ORAL_TABLET | Freq: Three times a day (TID) | ORAL | Status: DC | PRN

## 2021-05-01 MED ORDER — ACETAMINOPHEN 325 MG PO TABS: 325.0000 mg | ORAL_TABLET | Freq: Four times a day (QID) | ORAL | Status: DC | PRN

## 2021-05-01 MED ORDER — CARVEDILOL 25 MG PO TABS
25.0000 mg | ORAL_TABLET | Freq: Two times a day (BID) | ORAL | Status: DC
  Administered 2021-05-01 – 2021-05-02 (×2): 25 mg via ORAL
  Filled 2021-05-01 (×2): qty 1

## 2021-05-01 MED ORDER — LOSARTAN POTASSIUM 50 MG PO TABS
100.0000 mg | ORAL_TABLET | Freq: Every day | ORAL | Status: DC
  Administered 2021-05-02: 100 mg via ORAL
  Filled 2021-05-01: qty 2

## 2021-05-01 MED ORDER — ACETAMINOPHEN 500 MG PO TABS: 1000.0000 mg | ORAL_TABLET | Freq: Once | ORAL | Status: DC

## 2021-05-01 MED ORDER — TRANEXAMIC ACID-NACL 1000-0.7 MG/100ML-% IV SOLN
INTRAVENOUS | Status: AC
  Filled 2021-05-01: qty 100

## 2021-05-01 MED ORDER — DOCUSATE SODIUM 100 MG PO CAPS
100.0000 mg | ORAL_CAPSULE | Freq: Two times a day (BID) | ORAL | Status: DC
  Administered 2021-05-01 – 2021-05-02 (×2): 100 mg via ORAL
  Filled 2021-05-01 (×2): qty 1

## 2021-05-01 MED ORDER — CEFAZOLIN SODIUM-DEXTROSE 2-4 GM/100ML-% IV SOLN
2.0000 g | Freq: Four times a day (QID) | INTRAVENOUS | Status: AC
  Administered 2021-05-01 – 2021-05-02 (×2): 2 g via INTRAVENOUS
  Filled 2021-05-01 (×2): qty 100

## 2021-05-01 MED ORDER — IRRISEPT - 450ML BOTTLE WITH 0.05% CHG IN STERILE WATER, USP 99.95% OPTIME
TOPICAL | Status: DC | PRN
  Administered 2021-05-01: 450 mL via TOPICAL

## 2021-05-01 MED ORDER — HYDRALAZINE HCL 10 MG PO TABS
50.0000 mg | ORAL_TABLET | Freq: Three times a day (TID) | ORAL | Status: DC
  Administered 2021-05-01 – 2021-05-02 (×2): 50 mg via ORAL
  Filled 2021-05-01 (×2): qty 5

## 2021-05-01 MED ORDER — KETOROLAC TROMETHAMINE 15 MG/ML IJ SOLN
INTRAMUSCULAR | Status: AC
  Administered 2021-05-01: 15 mg via INTRAVENOUS
  Filled 2021-05-01: qty 1

## 2021-05-01 MED ORDER — FENTANYL CITRATE (PF) 100 MCG/2ML IJ SOLN: 25.0000 ug | INTRAMUSCULAR | Status: DC | PRN

## 2021-05-01 MED ORDER — SODIUM CHLORIDE 0.9 % IR SOLN
Status: DC | PRN
  Administered 2021-05-01: 1000 mL

## 2021-05-01 MED ORDER — ONDANSETRON HCL 4 MG PO TABS: 4.0000 mg | ORAL_TABLET | Freq: Four times a day (QID) | ORAL | Status: DC | PRN

## 2021-05-01 MED ORDER — HYDROMORPHONE HCL 1 MG/ML IJ SOLN: 0.5000 mg | INTRAMUSCULAR | Status: DC | PRN

## 2021-05-01 MED ORDER — CEFAZOLIN SODIUM-DEXTROSE 2-4 GM/100ML-% IV SOLN
2.0000 g | INTRAVENOUS | Status: AC
  Administered 2021-05-01: 2 g via INTRAVENOUS

## 2021-05-01 MED ORDER — PROPOFOL 500 MG/50ML IV EMUL
INTRAVENOUS | Status: DC | PRN
  Administered 2021-05-01: 200 ug/kg/min via INTRAVENOUS

## 2021-05-01 MED ORDER — MENTHOL 3 MG MT LOZG: 1.0000 | LOZENGE | OROMUCOSAL | Status: DC | PRN

## 2021-05-01 MED ORDER — BUPIVACAINE-MELOXICAM ER 400-12 MG/14ML IJ SOLN
INTRAMUSCULAR | Status: AC
  Filled 2021-05-01: qty 1

## 2021-05-01 MED ORDER — MIDAZOLAM HCL 2 MG/2ML IJ SOLN
INTRAMUSCULAR | Status: AC
  Filled 2021-05-01: qty 2

## 2021-05-01 MED ORDER — TRANEXAMIC ACID-NACL 1000-0.7 MG/100ML-% IV SOLN
1000.0000 mg | Freq: Once | INTRAVENOUS | Status: AC
  Administered 2021-05-01: 1000 mg via INTRAVENOUS
  Filled 2021-05-01: qty 100

## 2021-05-01 MED ORDER — POVIDONE-IODINE 10 % EX SWAB
2.0000 "application " | Freq: Once | CUTANEOUS | Status: AC
  Administered 2021-05-01: 2 via TOPICAL

## 2021-05-01 MED ORDER — ROPIVACAINE HCL 5 MG/ML IJ SOLN
INTRAMUSCULAR | Status: DC | PRN
  Administered 2021-05-01: 20 mL via PERINEURAL

## 2021-05-01 MED ORDER — METHOCARBAMOL 1000 MG/10ML IJ SOLN
500.0000 mg | Freq: Four times a day (QID) | INTRAVENOUS | Status: DC | PRN
  Filled 2021-05-01: qty 5

## 2021-05-01 MED ORDER — ACETAMINOPHEN 10 MG/ML IV SOLN
INTRAVENOUS | Status: AC
  Filled 2021-05-01: qty 100

## 2021-05-01 MED ORDER — PHENOL 1.4 % MT LIQD: 1.0000 | OROMUCOSAL | Status: DC | PRN

## 2021-05-01 MED ORDER — VANCOMYCIN HCL 1000 MG IV SOLR
INTRAVENOUS | Status: DC | PRN
  Administered 2021-05-01: 1000 mg

## 2021-05-01 MED ORDER — BUPIVACAINE IN DEXTROSE 0.75-8.25 % IT SOLN
INTRATHECAL | Status: DC | PRN
  Administered 2021-05-01: 1.8 mL via INTRATHECAL

## 2021-05-01 MED ORDER — LACTATED RINGERS IV SOLN: INTRAVENOUS | Status: DC

## 2021-05-01 MED ORDER — ONDANSETRON HCL 4 MG/2ML IJ SOLN
INTRAMUSCULAR | Status: DC | PRN
  Administered 2021-05-01: 4 mg via INTRAVENOUS

## 2021-05-01 SURGICAL SUPPLY — 84 items
ALCOHOL 70% 16 OZ (MISCELLANEOUS) ×2 IMPLANT
BAG COUNTER SPONGE SURGICOUNT (BAG) IMPLANT
BAG DECANTER FOR FLEXI CONT (MISCELLANEOUS) ×2 IMPLANT
BANDAGE ESMARK 6X9 LF (GAUZE/BANDAGES/DRESSINGS) IMPLANT
BLADE SAG 18X100X1.27 (BLADE) ×2 IMPLANT
BLADE SAW RECIP 87.9 MT (BLADE) ×1 IMPLANT
BLADE SAW SGTL 13.0X1.19X90.0M (BLADE) ×1 IMPLANT
BNDG ESMARK 6X9 LF (GAUZE/BANDAGES/DRESSINGS)
BOWL SMART MIX CTS (DISPOSABLE) ×2 IMPLANT
CLSR STERI-STRIP ANTIMIC 1/2X4 (GAUZE/BANDAGES/DRESSINGS) ×4 IMPLANT
COOLER ICEMAN CLASSIC (MISCELLANEOUS) ×2 IMPLANT
COVER SURGICAL LIGHT HANDLE (MISCELLANEOUS) ×2 IMPLANT
CUFF TOURN SGL QUICK 34 (TOURNIQUET CUFF) ×1
CUFF TOURN SGL QUICK 42 (TOURNIQUET CUFF) IMPLANT
CUFF TRNQT CYL 34X4.125X (TOURNIQUET CUFF) ×1 IMPLANT
DERMABOND ADVANCED (GAUZE/BANDAGES/DRESSINGS) ×1
DERMABOND ADVANCED .7 DNX12 (GAUZE/BANDAGES/DRESSINGS) ×1 IMPLANT
DRAPE EXTREMITY T 121X128X90 (DISPOSABLE) ×2 IMPLANT
DRAPE HALF SHEET 40X57 (DRAPES) ×2 IMPLANT
DRAPE INCISE IOBAN 66X45 STRL (DRAPES) ×2 IMPLANT
DRAPE ORTHO SPLIT 77X108 STRL (DRAPES) ×2
DRAPE POUCH INSTRU U-SHP 10X18 (DRAPES) ×2 IMPLANT
DRAPE SURG ORHT 6 SPLT 77X108 (DRAPES) ×2 IMPLANT
DRAPE U-SHAPE 47X51 STRL (DRAPES) ×4 IMPLANT
DRESSING AQUACEL AG SP 3.5X10 (GAUZE/BANDAGES/DRESSINGS) IMPLANT
DRSG AQUACEL AG ADV 3.5X10 (GAUZE/BANDAGES/DRESSINGS) ×2 IMPLANT
DRSG AQUACEL AG SP 3.5X10 (GAUZE/BANDAGES/DRESSINGS) ×2
DURAPREP 26ML APPLICATOR (WOUND CARE) ×6 IMPLANT
ELECT BLADE 4.0 EZ CLEAN MEGAD (MISCELLANEOUS) ×2
ELECT CAUTERY BLADE 6.4 (BLADE) ×2 IMPLANT
ELECT REM PT RETURN 9FT ADLT (ELECTROSURGICAL) ×2
ELECTRODE BLDE 4.0 EZ CLN MEGD (MISCELLANEOUS) IMPLANT
ELECTRODE REM PT RTRN 9FT ADLT (ELECTROSURGICAL) ×1 IMPLANT
FEMUR  CMT CCR STD SZ7 L KNEE (Knees) ×1 IMPLANT
FEMUR CMT CCR STD SZ7 L KNEE (Knees) ×1 IMPLANT
FEMUR CMTD CCR STD SZ7 L KNEE (Knees) IMPLANT
GLOVE SURG SYN 7.5  E (GLOVE) ×4
GLOVE SURG SYN 7.5 E (GLOVE) ×4 IMPLANT
GLOVE SURG SYN 7.5 PF PI (GLOVE) ×4 IMPLANT
GLOVE SURG UNDER LTX SZ7.5 (GLOVE) ×4 IMPLANT
GLOVE SURG UNDER POLY LF SZ7 (GLOVE) ×2 IMPLANT
GOWN STRL REIN XL XLG (GOWN DISPOSABLE) ×2 IMPLANT
GOWN STRL REUS W/ TWL LRG LVL3 (GOWN DISPOSABLE) ×1 IMPLANT
GOWN STRL REUS W/TWL LRG LVL3 (GOWN DISPOSABLE) ×1
HANDPIECE INTERPULSE COAX TIP (DISPOSABLE) ×1
HDLS TROCR DRIL PIN KNEE 75 (PIN) ×1
HOOD PEEL AWAY FLYTE STAYCOOL (MISCELLANEOUS) ×4 IMPLANT
INSERT ASF VE PS 14 6-9/EF LT (Insert) ×1 IMPLANT
JET LAVAGE IRRISEPT WOUND (IRRIGATION / IRRIGATOR) ×2
KIT BASIN OR (CUSTOM PROCEDURE TRAY) ×2 IMPLANT
KIT TURNOVER KIT B (KITS) ×2 IMPLANT
LAVAGE JET IRRISEPT WOUND (IRRIGATION / IRRIGATOR) ×1 IMPLANT
MANIFOLD NEPTUNE II (INSTRUMENTS) ×2 IMPLANT
MARKER SKIN DUAL TIP RULER LAB (MISCELLANEOUS) ×4 IMPLANT
NDL SPNL 18GX3.5 QUINCKE PK (NEEDLE) ×1 IMPLANT
NEEDLE SPNL 18GX3.5 QUINCKE PK (NEEDLE) ×2 IMPLANT
NS IRRIG 1000ML POUR BTL (IV SOLUTION) ×2 IMPLANT
PACK TOTAL JOINT (CUSTOM PROCEDURE TRAY) ×2 IMPLANT
PAD ARMBOARD 7.5X6 YLW CONV (MISCELLANEOUS) ×4 IMPLANT
PAD COLD SHLDR WRAP-ON (PAD) ×2 IMPLANT
PIN DRILL HDLS TROCAR 75 4PK (PIN) IMPLANT
SAW OSC TIP CART 19.5X105X1.3 (SAW) ×2 IMPLANT
SCREW FEMALE HEX FIX 25X2.5 (ORTHOPEDIC DISPOSABLE SUPPLIES) ×1 IMPLANT
SET HNDPC FAN SPRY TIP SCT (DISPOSABLE) ×1 IMPLANT
STAPLER VISISTAT 35W (STAPLE) IMPLANT
STEM POLY PAT PLY 35M KNEE (Knees) ×1 IMPLANT
STEM TIB ST PERS 14+30 (Stem) ×1 IMPLANT
STEM TIBIA 5 DEG SZ F L KNEE (Knees) IMPLANT
SUCTION FRAZIER HANDLE 10FR (MISCELLANEOUS) ×1
SUCTION TUBE FRAZIER 10FR DISP (MISCELLANEOUS) ×1 IMPLANT
SUT ETHILON 2 0 FS 18 (SUTURE) ×2 IMPLANT
SUT MNCRL AB 4-0 PS2 18 (SUTURE) IMPLANT
SUT VIC AB 0 CT1 27 (SUTURE) ×2
SUT VIC AB 0 CT1 27XBRD ANBCTR (SUTURE) ×2 IMPLANT
SUT VIC AB 1 CTX 27 (SUTURE) ×6 IMPLANT
SUT VIC AB 2-0 CT1 27 (SUTURE) ×4
SUT VIC AB 2-0 CT1 TAPERPNT 27 (SUTURE) ×4 IMPLANT
SYR 50ML LL SCALE MARK (SYRINGE) ×4 IMPLANT
TIBIA STEM 5 DEG SZ F L KNEE (Knees) ×2 IMPLANT
TOWEL GREEN STERILE (TOWEL DISPOSABLE) ×2 IMPLANT
TOWEL GREEN STERILE FF (TOWEL DISPOSABLE) ×2 IMPLANT
TRAY CATH 16FR W/PLASTIC CATH (SET/KITS/TRAYS/PACK) IMPLANT
UNDERPAD 30X36 HEAVY ABSORB (UNDERPADS AND DIAPERS) ×2 IMPLANT
YANKAUER SUCT BULB TIP NO VENT (SUCTIONS) ×4 IMPLANT

## 2021-05-01 NOTE — H&P (Signed)
PREOPERATIVE H&P  Chief Complaint: left knee degenerative joint disease  HPI: Juan Hughes is a 65 y.o. male who presents for surgical treatment of left knee degenerative joint disease.  He denies any changes in medical history.  Past Medical History:  Diagnosis Date   CHF (congestive heart failure) (HCC)    last ef 40-45% echo 05/2019   GERD (gastroesophageal reflux disease)    HFrEF (heart failure with reduced ejection fraction) (HCC)    Hypertension    Osteoarthritis of right knee    Stroke (HCC) 06/2019   left sided weakness   Past Surgical History:  Procedure Laterality Date   INNER EAR SURGERY     as teenager   TOTAL KNEE ARTHROPLASTY Right 02/24/2020   Procedure: RIGHT TOTAL KNEE ARTHROPLASTY;  Surgeon: Tarry Kos, MD;  Location: [facility];  Service: Orthopedics;  Laterality: Right;   Social History   Socioeconomic History   Marital status: Married    Spouse name: Not on file   Number of children: Not on file   Years of education: Not on file   Highest education level: Not on file  Occupational History   Not on file  Tobacco Use   Smoking status: Former    Types: Cigars    Quit date: 06/22/2019    Years since quitting: 1.8   Smokeless tobacco: Never  Vaping Use   Vaping Use: Never used  Substance and Sexual Activity   Alcohol use: Never   Drug use: Never   Sexual activity: Yes  Other Topics Concern   Not on file  Social History Narrative   Not on file   Social Determinants of Health   Financial Resource Strain: Not on file  Food Insecurity: Not on file  Transportation Needs: Not on file  Physical Activity: Not on file  Stress: Not on file  Social Connections: Not on file   Family History  Problem Relation Age of Onset   Hypertension Mother    No Known Allergies Prior to Admission medications   Medication Sig Start Date End Date Taking? Authorizing Provider  acetaminophen (TYLENOL) 500 MG tablet Take 1,000 mg by  mouth every 6 (six) hours as needed for moderate pain.   Yes [provider]  aspirin EC 81 MG tablet Take 1 tablet (81 mg total) by mouth daily. Swallow whole. 08/08/20  Yes Agbor-Etang, Arlys John, MD  atorvastatin (LIPITOR) 80 MG tablet Take 1 tablet (80 mg total) by mouth daily at 6 PM. 08/08/20 04/24/21 Yes Agbor-Etang, Arlys John, MD  carvedilol (COREG) 25 MG tablet Take 1 tablet (25 mg total) by mouth 2 (two) times daily. 08/08/20 04/24/21 Yes Agbor-Etang, Arlys John, MD  hydrALAZINE (APRESOLINE) 50 MG tablet Take 1 tablet (50 mg total) by mouth 3 (three) times daily. 08/08/20 04/24/21 Yes Agbor-Etang, Arlys John, MD  losartan (COZAAR) 100 MG tablet Take 1 tablet (100 mg total) by mouth daily. 02/09/21 05/10/21 Yes Agbor-Etang, Arlys John, MD  omeprazole (PRILOSEC) 20 MG capsule Take 20 mg by mouth daily.   Yes [provider]  spironolactone (ALDACTONE) 25 MG tablet Take 1 tablet (25 mg total) by mouth daily. 08/08/20 04/24/21 Yes Agbor-Etang, Arlys John, MD  docusate sodium (COLACE) 100 MG capsule Take 1 capsule (100 mg total) by mouth daily as needed. 04/28/21 04/28/22  Cristie Hem, PA-C  methocarbamol (ROBAXIN) 500 MG tablet Take 1 tablet (500 mg total) by mouth 2 (two) times daily as needed. To be taken after surgery 04/28/21   Jari Sportsman  L, PA-C  ondansetron (ZOFRAN) 4 MG tablet Take 1 tablet (4 mg total) by mouth every 8 (eight) hours as needed for nausea or vomiting. 04/28/21   Cristie Hem, PA-C  oxyCODONE-acetaminophen (PERCOCET) 5-325 MG tablet Take 1-2 tablets by mouth every 6 (six) hours as needed. To be taken after surgery 04/28/21   Cristie Hem, PA-C  rivaroxaban (XARELTO) 10 MG TABS tablet Take 1 tablet (10 mg total) by mouth in the morning and at bedtime. To be taken after surgery to prevent blood clots 04/28/21   Cristie Hem, PA-C     Positive ROS: All other systems have been reviewed and were otherwise negative with the exception of those mentioned in the HPI and as  above.  Physical Exam: General: Alert, no acute distress Cardiovascular: No pedal edema Respiratory: No cyanosis, no use of accessory musculature GI: abdomen soft Skin: No lesions in the area of chief complaint Neurologic: Sensation intact distally Psychiatric: Patient is competent for consent with normal mood and affect Lymphatic: no lymphedema  MUSCULOSKELETAL: exam stable  Assessment: left knee degenerative joint disease  Plan: Plan for Procedure(s): LEFT TOTAL KNEE ARTHROPLASTY  The risks benefits and alternatives were discussed with the patient including but not limited to the risks of nonoperative treatment, versus surgical intervention including infection, bleeding, nerve injury,  blood clots, cardiopulmonary complications, morbidity, mortality, among others, and they were willing to proceed.   Preoperative templating of the joint replacement has been completed, documented, and submitted to the Operating Room personnel in order to optimize intra-operative equipment management.   Glee Arvin, MD 05/01/2021 9:48 AM

## 2021-05-01 NOTE — Anesthesia Procedure Notes (Signed)
Anesthesia Regional Block: Adductor canal block   Pre-Anesthetic Checklist: , timeout performed,  Correct Patient, Correct Site, Correct Laterality,  Correct Procedure, Correct Position, site marked,  Risks and benefits discussed,  Surgical consent,  Pre-op evaluation,  At surgeon's request and post-op pain management  Laterality: Left  Prep: Maximum Sterile Barrier Precautions used, chloraprep       Needles:  Injection technique: Single-shot  Needle Type: Echogenic Stimulator Needle     Needle Length: 9cm  Needle Gauge: 22     Additional Needles:   Procedures:,,,, ultrasound used (permanent image in chart),,    Narrative:  Start time: 05/01/2021 11:45 AM End time: 05/01/2021 11:51 AM Injection made incrementally with aspirations every 5 mL.  Performed by: Personally  Anesthesiologist: Elmer Picker, MD  Additional Notes: Monitors applied. No increased pain on injection. No increased resistance to injection. Injection made in 5cc increments. Good needle visualization. Patient tolerated procedure well.

## 2021-05-01 NOTE — Transfer of Care (Signed)
Immediate Anesthesia Transfer of Care Note  Patient: Juan Hughes  Procedure(s) Performed: LEFT TOTAL KNEE ARTHROPLASTY (Left: Knee)  Patient Location: PACU  Anesthesia Type:Spinal  Level of Consciousness: awake and patient cooperative  Airway & Oxygen Therapy: Patient Spontanous Breathing  Post-op Assessment: Report given to RN, Post -op Vital signs reviewed and stable and Patient moving all extremities  Post vital signs: Reviewed and stable  Last Vitals:  Vitals Value Taken Time  BP 105/72 05/01/21 1534  Temp    Pulse 66 05/01/21 1535  Resp 16 05/01/21 1535  SpO2 97 % 05/01/21 1535  Vitals shown include unvalidated device data.  Last Pain:  Vitals:   05/01/21 1021  TempSrc:   PainSc: 0-No pain         Complications: No notable events documented.

## 2021-05-01 NOTE — Anesthesia Procedure Notes (Signed)
Spinal  Patient location during procedure: OR Start time: 05/01/2021 1:07 PM End time: 05/01/2021 1:11 PM Reason for block: surgical anesthesia Staffing Performed: anesthesiologist  Anesthesiologist: Val Eagle, MD Preanesthetic Checklist Completed: patient identified, IV checked, risks and benefits discussed, surgical consent, monitors and equipment checked, pre-op evaluation and timeout performed Spinal Block Patient position: sitting Prep: DuraPrep Patient monitoring: heart rate, cardiac monitor, continuous pulse ox and blood pressure Approach: midline Location: L3-4 Injection technique: single-shot Needle Needle type: Pencan  Needle gauge: 24 G Needle length: 9 cm Assessment Sensory level: T6 Events: CSF return

## 2021-05-01 NOTE — Op Note (Signed)
Total Knee Arthroplasty Procedure Note  Preoperative diagnosis: Left knee osteoarthritis, varus deformity  Postoperative diagnosis:same, posterolateral insufficiency  Operative procedure: Left total knee arthroplasty. CPT [id number]  Surgeon: N. Glee Arvin, MD  Assist: Oneal Grout, PA-C; necessary for the timely completion of procedure and due to complexity of procedure.  Anesthesia: Spinal, regional, local  Tourniquet time: see anesthesia record  Implants used: Zimmer persona Femur: PS 7 Tibia: F Patella: 35 mm Polyethylene: 14 mm, constrained PS  Indication: Juan Hughes is a 65 y.o. year old male with a history of knee pain. Having failed conservative management, the patient elected to proceed with a total knee arthroplasty.  We have reviewed the risk and benefits of the surgery and they elected to proceed after voicing understanding.  Procedure:  After informed consent was obtained and understanding of the risk were voiced including but not limited to bleeding, infection, damage to surrounding structures including nerves and vessels, blood clots, leg length inequality and the failure to achieve desired results, the operative extremity was marked with verbal confirmation of the patient in the holding area.   The patient was then brought to the operating room and transported to the operating room table in the supine position.  A tourniquet was applied to the operative extremity around the upper thigh. The operative limb was then prepped and draped in the usual sterile fashion and preoperative antibiotics were administered.  A time out was performed prior to the start of surgery confirming the correct extremity, preoperative antibiotic administration, as well as team members, implants and instruments available for the case. Correct surgical site was also confirmed with preoperative radiographs. The limb was then elevated for exsanguination and the tourniquet was  inflated. A midline incision was made and a standard medial parapatellar approach was performed.  The infrapatellar fat pad was removed.  Suprapatellar synovium was removed to reveal the anterior distal femoral cortex.  A medial peel was performed to release the capsule of the medial tibial plateau.  The patella was then everted and was prepared and sized to a 35 mm.  A cover was placed on the patella for protection from retractors.  The knee was then brought into full flexion and we then turned our attention to the femur.  The cruciates were sacrificed.  Start site was drilled in the femur and the intramedullary distal femoral cutting guide was placed, set at 3 degrees valgus, taking 10 mm of distal resection. The distal cut was made. Osteophytes were then removed.  Next, the proximal tibial cutting guide was placed with appropriate slope, varus/valgus alignment and depth of resection. The proximal tibial cut was made. Gap blocks were then used to assess the extension gap and alignment, and appropriate soft tissue releases were performed. Attention was turned back to the femur, which was sized using the sizing guide to a size 7. Appropriate rotation of the femoral component was determined using epicondylar axis, Whiteside's line, and assessing the flexion gap under ligament tension.  The medial side was tight therefore the capsule and the deep MCL were released.  The posterolateral corner had laxity to varus stress.  Therefore the decision was made to place a constrained poly.  The appropriate size 4-in-1 cutting block was placed and checked with an angel wing and cuts were made. Posterior femoral osteophytes and uncapped bone were then removed with the curved osteotome.  The PS box was prepped.  Trial components were placed, and stability was checked in full extension, mid-flexion, and deep  flexion. Proper tibial rotation was determined and marked.  The patella tracked well without a lateral release.  The femoral  lugs were then drilled. Trial components were then removed and tibial preparation performed.  The tibia was sized for a size F component.   The bony surfaces were irrigated with a pulse lavage and then dried. Bone cement was vacuum mixed on the back table, and the final components sized above were cemented into place.  Antibiotic irrigation was placed in the knee joint and soft tissues while the cement cured.  After cement had finished curing, excess cement was removed. The stability of the construct was re-evaluated throughout a range of motion and found to be acceptable. The trial liner was removed, the knee was copiously irrigated, and the knee was re-evaluated for any excess bone debris. The real polyethylene liner, 14 mm thick, was inserted and checked to ensure the locking mechanism had engaged appropriately. The tourniquet was deflated and hemostasis was achieved. The wound was irrigated with normal saline.  One gram of vancomycin powder was placed in the surgical bed.  Topical 0.25% bupivacaine and meloxicam was placed in the joint for postoperative pain.  Capsular closure was performed with a #1 vicryl, subcutaneous fat closed with a 0 vicryl suture, then subcutaneous tissue closed with interrupted 2.0 vicryl suture. The skin was then closed with a 2.0 nylon and dermabond. A sterile dressing was applied.  The patient was awakened in the operating room and taken to recovery in stable condition. All sponge, needle, and instrument counts were correct at the end of the case.  Tessa Lerner was necessary for opening, closing, retracting, limb positioning and overall facilitation and completion of the surgery.  Position: supine  Complications: none.  Time Out: performed   Drains/Packing: none  Estimated blood loss: minimal  Returned to Recovery Room: in good condition.   Antibiotics: yes   Mechanical VTE (DVT) Prophylaxis: sequential compression devices, TED thigh-high  Chemical VTE (DVT)  Prophylaxis: aspirin  Fluid Replacement  Crystalloid: see anesthesia record Blood: none  FFP: none   Specimens Removed: 1 to pathology   Sponge and Instrument Count Correct? yes   PACU: portable radiograph - knee AP and Lateral   Plan/RTC: Return in 2 weeks for wound check.   Weight Bearing/Load Lower Extremity: full   Implant Name Type Inv. Item Serial No. Manufacturer Lot No. LRB No. Used Action  FEMUR  CMT CCR STD SZ7 L KNEE - [id number] Knees FEMUR  CMT CCR STD SZ7 L KNEE  [employer](ORTH,TRAU,BIO,SG) [id number] Left 1 Implanted  TIBIA STEM 5 DEG SZ F L KNEE - [id number] Knees TIBIA STEM 5 DEG SZ F L KNEE  ZIMMER [employer]ON(ORTH,TRAU,BIO,SG) [id number] Left 1 Implanted  STEM TIB ST PERS 14+30 - [id number] Stem STEM TIB ST PERS 14+30  ZIMMER [employer]ON(ORTH,TRAU,BIO,SG) [id number] Left 1 Implanted  INSERT ASF VE PS 14 6-9/EF LT - [id number] Insert INSERT ASF VE PS 14 6-9/EF LT  ZIMMER [employer]ON(ORTH,TRAU,BIO,SG) [id number] Left 1 Implanted  STEM POLY PAT PLY 70M KNEE - [id number] Knees STEM POLY PAT PLY 70M KNEE  ZIMMER [employer]ON(ORTH,TRAU,BIO,SG) [id number] Left 1 Implanted    N. Glee Arvin, MD [facility] 2:54 PM

## 2021-05-01 NOTE — Discharge Instructions (Signed)

## 2021-05-01 NOTE — Anesthesia Postprocedure Evaluation (Signed)
Anesthesia Post Note  Patient: Juan Hughes  Procedure(s) Performed: LEFT TOTAL KNEE ARTHROPLASTY (Left: Knee)     Patient location during evaluation: PACU Anesthesia Type: Regional and Spinal Level of consciousness: awake and alert, patient cooperative and oriented Pain management: pain level controlled Vital Signs Assessment: post-procedure vital signs reviewed and stable Respiratory status: nonlabored ventilation, spontaneous breathing and respiratory function stable Cardiovascular status: blood pressure returned to baseline and stable Postop Assessment: spinal receding and no apparent nausea or vomiting Anesthetic complications: no   No notable events documented.  Last Vitals:  Vitals:   05/01/21 1650 05/01/21 1718  BP: 122/77 126/82  Pulse: (!) 54 (!) 56  Resp: 12 18  Temp: (!) 36.2 C 36.6 C  SpO2: 95% 98%    Last Pain:  Vitals:   05/01/21 1718  TempSrc: Oral  PainSc: 3                  Raynie Steinhaus,E. Shawneequa Baldridge

## 2021-05-01 NOTE — Anesthesia Preprocedure Evaluation (Addendum)
Anesthesia Evaluation  Patient identified by MRN, date of birth, ID band Patient awake    Reviewed: Allergy & Precautions, NPO status , Patient's Chart, lab work & pertinent test results, reviewed documented beta blocker date and time   Airway Mallampati: II  TM Distance: >3 FB Neck ROM: Full    Dental  (+) Dental Advisory Given, Chipped, Loose,    Pulmonary neg pulmonary ROS, former smoker,    Pulmonary exam normal breath sounds clear to auscultation       Cardiovascular hypertension, Pt. on home beta blockers and Pt. on medications +CHF  Normal cardiovascular exam Rhythm:Regular Rate:Normal  TTE 2022 1. Left ventricular ejection fraction, by estimation, is 55 to 60%. The  left ventricle has normal function. The left ventricle has no regional  wall motion abnormalities. Left ventricular diastolic parameters are  consistent with Grade I diastolic  dysfunction (impaired relaxation). The average left ventricular global  longitudinal strain is -16.5 %. The global longitudinal strain is normal.  2. Right ventricular systolic function is normal. The right ventricular  size is normal.  3. The mitral valve is normal in structure. Mild mitral valve  regurgitation. No evidence of mitral stenosis.  4. The aortic valve is normal in structure. Aortic valve regurgitation is  not visualized. No aortic stenosis is present.  5. The inferior vena cava is normal in size with greater than 50%  respiratory variability, suggesting right atrial pressure of 3 mmHg.    Neuro/Psych CVA (left sided weakness), Residual Symptoms negative psych ROS   GI/Hepatic Neg liver ROS, GERD  Controlled and Medicated,  Endo/Other  negative endocrine ROS  Renal/GU negative Renal ROS  negative genitourinary   Musculoskeletal  (+) Arthritis ,   Abdominal   Peds  Hematology   Anesthesia Other Findings   Reproductive/Obstetrics                            Anesthesia Physical Anesthesia Plan  ASA: 3  Anesthesia Plan: Spinal and Regional   Post-op Pain Management: Regional block and Ofirmev IV (intra-op)   Induction:   PONV Risk Score and Plan: 1 and Treatment may vary due to age or medical condition, Midazolam, Ondansetron, Dexamethasone and Propofol infusion  Airway Management Planned: Natural Airway  Additional Equipment:   Intra-op Plan:   Post-operative Plan:   Informed Consent: I have reviewed the patients History and Physical, chart, labs and discussed the procedure including the risks, benefits and alternatives for the proposed anesthesia with the patient or authorized representative who has indicated his/her understanding and acceptance.     Dental advisory given  Plan Discussed with: CRNA  Anesthesia Plan Comments:        Anesthesia Quick Evaluation

## 2021-05-01 NOTE — Anesthesia Procedure Notes (Signed)
Procedure Name: MAC Date/Time: 05/01/2021 1:09 PM Performed by: Claris Che, CRNA Pre-anesthesia Checklist: Patient identified, Emergency Drugs available, Suction available, Patient being monitored and Timeout performed Patient Re-evaluated:Patient Re-evaluated prior to induction Oxygen Delivery Method: Simple face mask Ventilation: Oral airway inserted - appropriate to patient size Placement Confirmation: positive ETCO2 Dental Injury: Teeth and Oropharynx as per pre-operative assessment

## 2021-05-02 ENCOUNTER — Encounter (HOSPITAL_COMMUNITY): Payer: Self-pay | Admitting: Orthopaedic Surgery

## 2021-05-02 ENCOUNTER — Other Ambulatory Visit: Payer: Self-pay | Admitting: Physician Assistant

## 2021-05-02 ENCOUNTER — Other Ambulatory Visit (HOSPITAL_COMMUNITY): Payer: Self-pay

## 2021-05-02 LAB — CBC
HCT: 42.8 % (ref 39.0–52.0)
Hemoglobin: 13.6 g/dL (ref 13.0–17.0)
MCH: 28.5 pg (ref 26.0–34.0)
MCHC: 31.8 g/dL (ref 30.0–36.0)
MCV: 89.7 fL (ref 80.0–100.0)
Platelets: 244 10*3/uL (ref 150–400)
RBC: 4.77 MIL/uL (ref 4.22–5.81)
RDW: 13.5 % (ref 11.5–15.5)
WBC: 12.2 10*3/uL — ABNORMAL HIGH (ref 4.0–10.5)
nRBC: 0 % (ref 0.0–0.2)

## 2021-05-02 LAB — BASIC METABOLIC PANEL
Anion gap: 9 (ref 5–15)
BUN: 29 mg/dL — ABNORMAL HIGH (ref 8–23)
CO2: 22 mmol/L (ref 22–32)
Calcium: 9.1 mg/dL (ref 8.9–10.3)
Chloride: 106 mmol/L (ref 98–111)
Creatinine, Ser: 2.31 mg/dL — ABNORMAL HIGH (ref 0.61–1.24)
GFR, Estimated: 31 mL/min — ABNORMAL LOW (ref >60)
Glucose, Bld: 147 mg/dL — ABNORMAL HIGH (ref 70–99)
Potassium: 5.3 mmol/L — ABNORMAL HIGH (ref 3.5–5.1)
Sodium: 137 mmol/L (ref 135–145)

## 2021-05-02 MED ORDER — OXYCODONE-ACETAMINOPHEN 5-325 MG PO TABS
1.0000 | ORAL_TABLET | Freq: Four times a day (QID) | ORAL | 0 refills | Status: DC | PRN
  Filled 2021-05-02: qty 40, 5d supply, fill #0

## 2021-05-02 MED ORDER — RIVAROXABAN 10 MG PO TABS: 10.0000 mg | ORAL_TABLET | Freq: Every day | ORAL | 0 refills | Status: DC

## 2021-05-02 NOTE — Progress Notes (Signed)
Physical Therapy Treatment Patient Details Name: Juan Hughes MRN: [id number] DOB: 1955/08/06 Today's Date: 05/02/2021   History of Present Illness Pt is a 65 y.o. M admitted s/p left total knee arthroplasty 05/01/2021. Significant PMH: CHF, HFrEF, stroke, R TKA.    PT Comments    Pt continues with excellent pain control and motivation. Ambulating 400 feet with a walker, utilizing a step through pattern. Reviewed HEP and education with pt family (wife and sister) present. Pt and pt family with no further questions/concerns.     Recommendations for follow up therapy are one component of a multi-disciplinary discharge planning process, led by the attending physician.  Recommendations may be updated based on patient status, additional functional criteria and insurance authorization.  Follow Up Recommendations  Follow physician's recommendations for discharge plan and follow up therapies     Assistance Recommended at Discharge Intermittent Supervision/Assistance  Equipment Recommendations  None recommended by PT (pt has needed DME)    Recommendations for Other Services       Precautions / Restrictions Precautions Precautions: None Restrictions Weight Bearing Restrictions: No     Mobility  Bed Mobility Overal bed mobility: Modified Independent                  Transfers Overall transfer level: Modified independent Equipment used: Rolling walker (2 wheels)                    Ambulation/Gait Ambulation/Gait assistance: Modified independent (Device/Increase time) Gait Distance (Feet): 400 Feet Assistive device: Rolling walker (2 wheels) Gait Pattern/deviations: Step-through pattern;Decreased weight shift to left;Decreased step length - right Gait velocity: decreased     General Gait Details: Cues for walker proximity, equal step lengths   Stairs             Wheelchair Mobility    Modified Rankin (Stroke Patients Only)        Balance Overall balance assessment: Needs assistance Sitting-balance support: Feet supported Sitting balance-Leahy Scale: Good     Standing balance support: No upper extremity supported;During functional activity Standing balance-Leahy Scale: Good                              Cognition Arousal/Alertness: Awake/alert Behavior During Therapy: WFL for tasks assessed/performed Overall Cognitive Status: Within Functional Limits for tasks assessed                                          Exercises Total Joint Exercises Quad Sets: Both;15 reps;Supine Heel Slides: Both;15 reps;Supine Hip ABduction/ADduction: Left;10 reps;Supine Straight Leg Raises: Left;10 reps;Supine Long Arc Quad: Left;15 reps;Seated Knee Flexion: Left;10 reps;Seated    General Comments        Pertinent Vitals/Pain Pain Assessment: Faces Faces Pain Scale: Hurts a little bit Pain Location: L knee Pain Descriptors / Indicators: Operative site guarding Pain Intervention(s): Monitored during session    Home Living                          Prior Function            PT Goals (current goals can now be found in the care plan section) Acute Rehab PT Goals Patient Stated Goal: improved quality of life PT Goal Formulation: With patient Time For Goal Achievement: 05/16/21 Potential to Achieve Goals: Good  Frequency    7X/week      PT Plan Current plan remains appropriate    Co-evaluation              AM-PAC PT "6 Clicks" Mobility   Outcome Measure  Help needed turning from your back to your side while in a flat bed without using bedrails?: None Help needed moving from lying on your back to sitting on the side of a flat bed without using bedrails?: None Help needed moving to and from a bed to a chair (including a wheelchair)?: None Help needed standing up from a chair using your arms (e.g., wheelchair or bedside chair)?: None Help needed to walk in  hospital room?: None Help needed climbing 3-5 steps with a railing? : A Little 6 Click Score: 23    End of Session   Activity Tolerance: Patient tolerated treatment well Patient left: with call bell/phone within reach;in bed Nurse Communication: Mobility status PT Visit Diagnosis: Difficulty in walking, not elsewhere classified (R26.2);Pain Pain - Right/Left: Left Pain - part of body: Knee     Time: 1200-1225 PT Time Calculation (min) (ACUTE ONLY): 25 min  Charges:  $Gait Training: 8-22 mins $Therapeutic Exercise: 8-22 mins                     Lillia Pauls, PT, DPT Acute Rehabilitation Services Pager [phone] Office [phone]    Norval Morton 05/02/2021, 3:37 PM

## 2021-05-02 NOTE — Progress Notes (Signed)
Subjective: 1 Day Post-Op Procedure(s) (LRB): LEFT TOTAL KNEE ARTHROPLASTY (Left) Patient reports pain as mild.  Doing well this am. No complaints  Objective: Vital signs in last 24 hours: Temp:  [97 F (36.1 C)-98.5 F (36.9 C)] 98.5 F (36.9 C) (12/13 0711) Pulse Rate:  [52-71] 71 (12/13 0711) Resp:  [11-20] 18 (12/13 0711) BP: (100-142)/(62-83) 124/75 (12/13 0711) SpO2:  [93 %-99 %] 97 % (12/13 0711)  Intake/Output from previous day: 12/12 0701 - 12/13 0700 In: 2080.1 [P.O.:480; I.V.:1300; IV Piggyback:300.1] Out: 1400 [Urine:1350; Blood:50] Intake/Output this shift: No intake/output data recorded.  Recent Labs    05/02/21 0712  HGB 13.6   Recent Labs    05/02/21 0712  WBC 12.2*  RBC 4.77  HCT 42.8  PLT 244   Recent Labs    05/02/21 0712  NA 137  K 5.3*  CL 106  CO2 22  BUN 29*  CREATININE 2.31*  GLUCOSE 147*  CALCIUM 9.1   No results for input(s): LABPT, INR in the last 72 hours.  Neurologically intact Neurovascular intact Sensation intact distally Intact pulses distally Dorsiflexion/Plantar flexion intact Incision: dressing C/D/I No cellulitis present Compartment soft   Assessment/Plan: 1 Day Post-Op Procedure(s) (LRB): LEFT TOTAL KNEE ARTHROPLASTY (Left) Advance diet Up with therapy WBAT LLE AKI- d/c toradol.  Encourage po intake D/c home after first or second pt session (depending on progression)   Anticipated LOS equal to or greater than 2 midnights due to - Age 63 and older with one or more of the following:  - Obesity  - Expected need for hospital services (PT, OT, Nursing) required for safe  discharge  - Anticipated need for postoperative skilled nursing care or inpatient rehab  - Active co-morbidities: Stroke OR   - Unanticipated findings during/Post Surgery: None  - Patient is a high risk of re-admission due to: None   Cristie Hem 05/02/2021, 8:12 AM

## 2021-05-02 NOTE — Progress Notes (Signed)
OT Cancellation Note  Patient Details Name: Juan Hughes MRN: [id number] DOB: [birth year]   Cancelled Treatment:    Reason Eval/Treat Not Completed: OT screened, no needs identified, will sign off. Per PT, patient ambulated 281ft with RW and no external assist.   Kallie Edward. OTR/L Supplemental OT, Department of rehab services [phone]  Ison Wichmann R H. 05/02/2021, 10:18 AM

## 2021-05-02 NOTE — Progress Notes (Signed)
Pt doing well. Pt given D/C instructions with verbal understanding. Rx's were sent to the pharmacy by MD. Pt's incision is clean and dry with no sign of infection. Pt's IV was removed prior to D/C. Pt D/C'd home via wheelchair per MD order. Pt is stable @ D/C and has no other needs at this time. Luisfernando Brightwell, RN  

## 2021-05-02 NOTE — Discharge Summary (Signed)
Patient ID: Juan Hughes MRN: [id number] DOB/AGE: 63-Sep-1957 63 y.o.  Admit date: 05/01/2021 Discharge date: 05/02/2021  Admission Diagnoses:  Principal Problem:   Primary osteoarthritis of left knee Active Problems:   Status post total left knee replacement   Discharge Diagnoses:  Same  Past Medical History:  Diagnosis Date   CHF (congestive heart failure) (HCC)    last ef 40-45% echo 05/2019   GERD (gastroesophageal reflux disease)    HFrEF (heart failure with reduced ejection fraction) (HCC)    Hypertension    Osteoarthritis of right knee    Stroke (HCC) 06/2019   left sided weakness    Surgeries: Procedure(s): LEFT TOTAL KNEE ARTHROPLASTY on 05/01/2021   Consultants:   Discharged Condition: Improved  Hospital Course: Juan Hughes is an 65 y.o. male who was admitted 05/01/2021 for operative treatment ofPrimary osteoarthritis of left knee. Patient has severe unremitting pain that affects sleep, daily activities, and work/hobbies. After pre-op clearance the patient was taken to the operating room on 05/01/2021 and underwent  Procedure(s): LEFT TOTAL KNEE ARTHROPLASTY.    Patient was given perioperative antibiotics:  Anti-infectives (From admission, onward)    Start     Dose/Rate Route Frequency Ordered Stop   05/01/21 1815  ceFAZolin (ANCEF) IVPB 2g/100 mL premix        2 g 200 mL/hr over 30 Minutes Intravenous Every 6 hours 05/01/21 1721 05/02/21 0031   05/01/21 1435  vancomycin (VANCOCIN) powder  Status:  Discontinued          As needed 05/01/21 1436 05/01/21 1538   05/01/21 1010  ceFAZolin (ANCEF) 2-4 GM/100ML-% IVPB       Note to Pharmacy: Pauletta Browns L: cabinet override      05/01/21 1010 05/01/21 1334   05/01/21 1000  ceFAZolin (ANCEF) IVPB 2g/100 mL premix        2 g 200 mL/hr over 30 Minutes Intravenous On call to O.R. 05/01/21 0958 05/01/21 1318        Patient was given sequential compression devices, early ambulation, and  chemoprophylaxis to prevent DVT.  Patient benefited maximally from hospital stay and there were no complications.    Recent vital signs: Patient Vitals for the past 24 hrs:  BP Temp Temp src Pulse Resp SpO2  05/02/21 0711 124/75 98.5 F (36.9 C) Oral 71 18 97 %  05/02/21 0351 111/64 97.8 F (36.6 C) Oral 71 18 93 %  05/01/21 2325 (!) 142/75 98.3 F (36.8 C) Oral 66 20 96 %  05/01/21 1922 138/83 98 F (36.7 C) Oral 69 18 99 %  05/01/21 1718 126/82 97.8 F (36.6 C) Oral (!) 56 18 98 %  05/01/21 1650 122/77 (!) 97.2 F (36.2 C) -- (!) 54 12 95 %  05/01/21 1635 111/73 -- -- (!) 55 11 94 %  05/01/21 1620 115/76 -- -- (!) 52 11 95 %  05/01/21 1605 112/71 -- -- (!) 58 11 95 %  05/01/21 1550 109/78 -- -- (!) 53 11 97 %  05/01/21 1535 105/72 (!) 97 F (36.1 C) -- 66 16 97 %  05/01/21 1150 100/62 -- -- 68 13 99 %  05/01/21 1145 129/70 -- -- 63 18 97 %  05/01/21 1009 107/62 97.6 F (36.4 C) Oral 69 17 99 %     Recent laboratory studies:  Recent Labs    05/02/21 0712  WBC 12.2*  HGB 13.6  HCT 42.8  PLT 244  NA 137  K 5.3*  CL 106  CO2 22  BUN 29*  CREATININE 2.31*  GLUCOSE 147*  CALCIUM 9.1     Discharge Medications:   Allergies as of 05/02/2021   No Known Allergies      Medication List     STOP taking these medications    acetaminophen 500 MG tablet Commonly known as: TYLENOL   aspirin EC 81 MG tablet       TAKE these medications    atorvastatin 80 MG tablet Commonly known as: LIPITOR Take 1 tablet (80 mg total) by mouth daily at 6 PM.   carvedilol 25 MG tablet Commonly known as: COREG Take 1 tablet (25 mg total) by mouth 2 (two) times daily.   docusate sodium 100 MG capsule Commonly known as: Colace Take 1 capsule (100 mg total) by mouth daily as needed.   hydrALAZINE 50 MG tablet Commonly known as: APRESOLINE Take 1 tablet (50 mg total) by mouth 3 (three) times daily.   losartan 100 MG tablet Commonly known as: COZAAR Take 1 tablet (100  mg total) by mouth daily.   methocarbamol 500 MG tablet Commonly known as: Robaxin Take 1 tablet (500 mg total) by mouth 2 (two) times daily as needed. To be taken after surgery   omeprazole 20 MG capsule Commonly known as: PRILOSEC Take 20 mg by mouth daily.   ondansetron 4 MG tablet Commonly known as: Zofran Take 1 tablet (4 mg total) by mouth every 8 (eight) hours as needed for nausea or vomiting.   oxyCODONE-acetaminophen 5-325 MG tablet Commonly known as: Percocet Take 1-2 tablets by mouth every 6 (six) hours as needed. To be taken after surgery   rivaroxaban 10 MG Tabs tablet Commonly known as: XARELTO Take 1 tablet (10 mg total) by mouth in the morning and at bedtime. To be taken after surgery to prevent blood clots   spironolactone 25 MG tablet Commonly known as: ALDACTONE Take 1 tablet (25 mg total) by mouth daily.               Durable Medical Equipment  (From admission, onward)           Start     Ordered   05/01/21 1722  DME Walker rolling  Once       Question Answer Comment  Walker: With 5 Inch Wheels   Patient needs a walker to treat with the following condition Status post left partial knee replacement      05/01/21 1721   05/01/21 1722  DME 3 n 1  Once        05/01/21 1721   05/01/21 1722  DME Bedside commode  Once       Question:  Patient needs a bedside commode to treat with the following condition  Answer:  Status post left partial knee replacement   05/01/21 1721            Diagnostic Studies: DG Knee Left Port  Result Date: 05/01/2021 CLINICAL DATA:  65 year old male postop for knee replacement. EXAM: PORTABLE LEFT KNEE - 1-2 VIEW COMPARISON:  February 16, 2021. FINDINGS: Immediate postoperative changes from total knee arthroplasty are noted with small amounts of gas and fluid in the joint. Anatomic alignment is noted. No immediate complications are demonstrated. IMPRESSION: Immediate postoperative changes from left total knee  arthroplasty. Electronically Signed   By: Donzetta Kohut M.D.   On: 05/01/2021 16:19    Disposition: Discharge disposition: 01-Home or Self Care          Follow-up Information  Tarry Kos, MD. Schedule an appointment as soon as possible for a visit in 2 week(s).   Specialty: Orthopedic Surgery Contact information: [address] [place] [address] [phone]                  Signed: Cristie Hem 05/02/2021, 8:14 AM

## 2021-05-02 NOTE — Evaluation (Signed)
Physical Therapy Evaluation Patient Details Name: Juan Hughes MRN: [id number] DOB: 1956/04/21 Today's Date: 05/02/2021  History of Present Illness  Pt is a 65 y.o. M admitted s/p left total knee arthroplasty 05/01/2021. Significant PMH: CHF, HFrEF, stroke, R TKA.  Clinical Impression  Pt admitted s/p L TKA. Overall, he verbalizes excellent pain control and is moving very well. Reviewed HEP for left lower extremity strengthening and ROM (written handout provided). Pt ambulating 200 feet with a walker without physical assist; utilizing a step through pattern. Practiced a half flight of stairs with a railing with cues for technique. In the sitting position, pt achieving 103 degrees active knee flexion. Will see for one more additional session acutely to review education, gait training and activity recommendations.      Recommendations for follow up therapy are one component of a multi-disciplinary discharge planning process, led by the attending physician.  Recommendations may be updated based on patient status, additional functional criteria and insurance authorization.  Follow Up Recommendations Follow physician's recommendations for discharge plan and follow up therapies    Assistance Recommended at Discharge Intermittent Supervision/Assistance  Functional Status Assessment Patient has had a recent decline in their functional status and demonstrates the ability to make significant improvements in function in a reasonable and predictable amount of time.  Equipment Recommendations  None recommended by PT (pt has needed DME)    Recommendations for Other Services       Precautions / Restrictions Precautions Precautions: None Restrictions Weight Bearing Restrictions: No      Mobility  Bed Mobility Overal bed mobility: Modified Independent                  Transfers Overall transfer level: Modified independent Equipment used: Rolling walker (2 wheels)                     Ambulation/Gait Ambulation/Gait assistance: Modified independent (Device/Increase time) Gait Distance (Feet): 200 Feet Assistive device: Rolling walker (2 wheels) Gait Pattern/deviations: Step-through pattern;Decreased dorsiflexion - left;Decreased weight shift to left;Decreased step length - right Gait velocity: decreased     General Gait Details: Pt utilizing step through pattern, cues for left heel strike on initiact contact, equal step lengths  Stairs Stairs: Yes Stairs assistance: Supervision Stair Management: One rail Right Number of Stairs: 10 General stair comments: cues for sequencing and step by step  Wheelchair Mobility    Modified Rankin (Stroke Patients Only)       Balance Overall balance assessment: Needs assistance Sitting-balance support: Feet supported Sitting balance-Leahy Scale: Good     Standing balance support: No upper extremity supported;During functional activity Standing balance-Leahy Scale: Good                               Pertinent Vitals/Pain Pain Assessment: Faces Faces Pain Scale: Hurts a little bit Pain Location: L knee Pain Descriptors / Indicators: Operative site guarding Pain Intervention(s): Monitored during session    Home Living Family/patient expects to be discharged to:: Private residence Living Arrangements: Spouse/significant other;Children Available Help at Discharge: Family Type of Home: House Home Access: Stairs to enter Entrance Stairs-Rails: Doctor, general practice of Steps: 5   Home Layout: One level Home Equipment: Pharmacist, hospital (2 wheels);Cane - single point      Prior Function Prior Level of Function : Independent/Modified Independent  Hand Dominance        Extremity/Trunk Assessment   Upper Extremity Assessment Upper Extremity Assessment: Overall WFL for tasks assessed    Lower Extremity Assessment Lower Extremity  Assessment: LLE deficits/detail LLE Deficits / Details: s/p TKA, not formally assessed but at least > 3/5    Cervical / Trunk Assessment Cervical / Trunk Assessment: Normal  Communication   Communication: Prefers language other than English Conservation officer, historic buildings utilized)  Cognition Arousal/Alertness: Awake/alert Behavior During Therapy: WFL for tasks assessed/performed Overall Cognitive Status: Within Functional Limits for tasks assessed                                          General Comments      Exercises Total Joint Exercises Quad Sets: Both;15 reps;Supine Heel Slides: Both;15 reps;Supine Hip ABduction/ADduction: Left;10 reps;Supine Straight Leg Raises: Left;10 reps;Supine Long Arc Quad: Left;15 reps;Seated Knee Flexion: Left;10 reps;Seated Goniometric ROM: LLE: 103 degrees seated knee flexion   Assessment/Plan    PT Assessment Patient needs continued PT services  PT Problem List Decreased strength;Decreased range of motion;Decreased mobility;Pain       PT Treatment Interventions DME instruction;Gait training;Stair training;Functional mobility training;Therapeutic activities;Balance training;Therapeutic exercise;Patient/family education    PT Goals (Current goals can be found in the Care Plan section)  Acute Rehab PT Goals Patient Stated Goal: improved quality of life PT Goal Formulation: With patient Time For Goal Achievement: 05/16/21 Potential to Achieve Goals: Good    Frequency 7X/week   Barriers to discharge        Co-evaluation               AM-PAC PT "6 Clicks" Mobility  Outcome Measure Help needed turning from your back to your side while in a flat bed without using bedrails?: None Help needed moving from lying on your back to sitting on the side of a flat bed without using bedrails?: None Help needed moving to and from a bed to a chair (including a wheelchair)?: None Help needed standing up from a chair using your arms  (e.g., wheelchair or bedside chair)?: None Help needed to walk in hospital room?: None Help needed climbing 3-5 steps with a railing? : A Little 6 Click Score: 23    End of Session   Activity Tolerance: Patient tolerated treatment well Patient left: in chair;with call bell/phone within reach Nurse Communication: Mobility status PT Visit Diagnosis: Difficulty in walking, not elsewhere classified (R26.2);Pain Pain - Right/Left: Left Pain - part of body: Knee    Time: 0800-0833 PT Time Calculation (min) (ACUTE ONLY): 33 min   Charges:   PT Evaluation $PT Eval Low Complexity: 1 Low PT Treatments $Therapeutic Exercise: 8-22 mins        Lillia Pauls, PT, DPT Acute Rehabilitation Services Pager [phone] Office [phone]   Norval Morton 05/02/2021, 9:17 AM

## 2021-05-03 ENCOUNTER — Encounter (HOSPITAL_COMMUNITY): Payer: Self-pay | Admitting: Orthopaedic Surgery

## 2021-05-04 ENCOUNTER — Telehealth: Payer: Self-pay

## 2021-05-04 NOTE — Telephone Encounter (Signed)
Patient had Surgery 05/01/2021-L TKA. Has [facility] financial assistance. Do you want him to go to Outpatient PT?

## 2021-05-05 NOTE — Telephone Encounter (Signed)
yes

## 2021-05-08 ENCOUNTER — Telehealth: Payer: Self-pay | Admitting: Orthopaedic Surgery

## 2021-05-08 ENCOUNTER — Other Ambulatory Visit: Payer: Self-pay | Admitting: Physician Assistant

## 2021-05-08 ENCOUNTER — Other Ambulatory Visit: Payer: Self-pay

## 2021-05-08 DIAGNOSIS — Z96651 Presence of right artificial knee joint: Secondary | ICD-10-CM

## 2021-05-08 MED ORDER — OXYCODONE-ACETAMINOPHEN 5-325 MG PO TABS
1.0000 | ORAL_TABLET | Freq: Three times a day (TID) | ORAL | 0 refills | Status: DC | PRN
Start: 2021-05-08 — End: 2021-05-17

## 2021-05-08 NOTE — Telephone Encounter (Signed)
Sent in

## 2021-05-08 NOTE — Telephone Encounter (Signed)
Please advise 

## 2021-05-08 NOTE — Telephone Encounter (Signed)
Patient's wife Juan Hughes called advised patient need a refill on his pain medicine   (Oxycodone) The number to contact  [facility] [phone]

## 2021-05-08 NOTE — Telephone Encounter (Signed)
Would like to go to [facility] close to home.

## 2021-05-09 NOTE — Telephone Encounter (Signed)
I called [name] and advised.

## 2021-05-16 ENCOUNTER — Encounter: Payer: Self-pay | Admitting: Orthopaedic Surgery

## 2021-05-17 ENCOUNTER — Other Ambulatory Visit: Payer: Self-pay

## 2021-05-17 ENCOUNTER — Ambulatory Visit (INDEPENDENT_AMBULATORY_CARE_PROVIDER_SITE_OTHER): Payer: Self-pay | Admitting: Physician Assistant

## 2021-05-17 ENCOUNTER — Encounter: Payer: Self-pay | Admitting: Orthopaedic Surgery

## 2021-05-17 DIAGNOSIS — Z96652 Presence of left artificial knee joint: Secondary | ICD-10-CM

## 2021-05-17 MED ORDER — METHOCARBAMOL 500 MG PO TABS: 500.0000 mg | ORAL_TABLET | Freq: Two times a day (BID) | ORAL | 2 refills | Status: AC | PRN

## 2021-05-17 MED ORDER — OXYCODONE-ACETAMINOPHEN 5-325 MG PO TABS: 1.0000 | ORAL_TABLET | Freq: Four times a day (QID) | ORAL | 0 refills | Status: DC | PRN

## 2021-05-17 NOTE — Progress Notes (Signed)
Post-Op Visit Note   Patient: Juan Hughes           Date of Birth: 01-25-1956           MRN: [id number] Visit Date: 05/17/2021 PCP: Gorden Harms, PA-C   Assessment & Plan:  Chief Complaint:  Chief Complaint  Patient presents with   Left Knee - Routine Post Op   Visit Diagnoses:  1. Hx of total knee replacement, left     Plan: Patient is a pleasant 65 year old Spanish-speaking gentleman who is here today with interpreter.  He has 2 weeks status post left total knee replacement 05/01/2021.  He has been doing well.  He has been taking oxycodone for pain.  He has been compliant with the Xarelto for DVT prophylaxis.  He denies any calf pain, chest pain or shortness of breath.  Examination of the left knee reveals a well healed surgical incision with nylon sutures in place.  No evidence of infection or cellulitis.  Today, sutures were removed and Steri-Strips applied.  Dental prophylaxis reinforced.  I have refilled his Percocet and Robaxin as well as sent in an internal referral for physical therapy.  He does not hear anything in the next week from therapy, he will call us and let us know.  He will continue with the Xarelto for another 2 weeks and then return to his baseline aspirin.  Follow-up with [place] in 4 weeks time for repeat evaluation and 2 view x-rays of the left knee.  Call with concerns or questions.  Follow-Up Instructions: Return in about 4 weeks (around 06/14/2021).   Orders:  No orders of the defined types were placed in this encounter.  No orders of the defined types were placed in this encounter.   Imaging: No new imaging  PMFS History: Patient Active Problem List   Diagnosis Date Noted   Status post total left knee replacement 05/01/2021   Hx of total knee replacement, right 02/16/2021   Primary osteoarthritis of left knee 02/16/2021   Status post total knee replacement, right 02/24/2020   Osteoarthritis of right knee 02/23/2020   Nonischemic  cardiomyopathy (HCC)    HFrEF (heart failure with reduced ejection fraction) (HCC)    Stroke (HCC) 06/18/2019   Hypertension    Left-sided weakness    Hypokalemia    Tobacco abuse    Past Medical History:  Diagnosis Date   CHF (congestive heart failure) (HCC)    last ef 40-45% echo 05/2019   GERD (gastroesophageal reflux disease)    HFrEF (heart failure with reduced ejection fraction) (HCC)    Hypertension    Osteoarthritis of right knee    Stroke (HCC) 06/2019   left sided weakness    Family History  Problem Relation Age of Onset   Hypertension Mother     Past Surgical History:  Procedure Laterality Date   INNER EAR SURGERY     as teenager   TOTAL KNEE ARTHROPLASTY Right 02/24/2020   Procedure: RIGHT TOTAL KNEE ARTHROPLASTY;  Surgeon: Tarry Kos, MD;  Location:  [facility];  Service: Orthopedics;  Laterality: Right;   TOTAL KNEE ARTHROPLASTY Left 05/01/2021   Procedure: LEFT TOTAL KNEE ARTHROPLASTY;  Surgeon: Tarry Kos, MD;  Location: [facility];  Service: Orthopedics;  Laterality: Left;   Social History   Occupational History   Not on file  Tobacco Use   Smoking status: Former    Types: Cigars    Quit date: 06/22/2019    Years since quitting:  1.9   Smokeless tobacco: Never  Vaping Use   Vaping Use: Never used  Substance and Sexual Activity   Alcohol use: Never   Drug use: Never   Sexual activity: Yes

## 2021-05-21 IMAGING — CR DG CHEST 2V
2 series · 2 of 2 positions shown · non-contrast
Comparison: None.

CLINICAL DATA: Preoperative evaluation for knee surgery.

EXAM:
CHEST - 2 VIEW

[w chest pa]
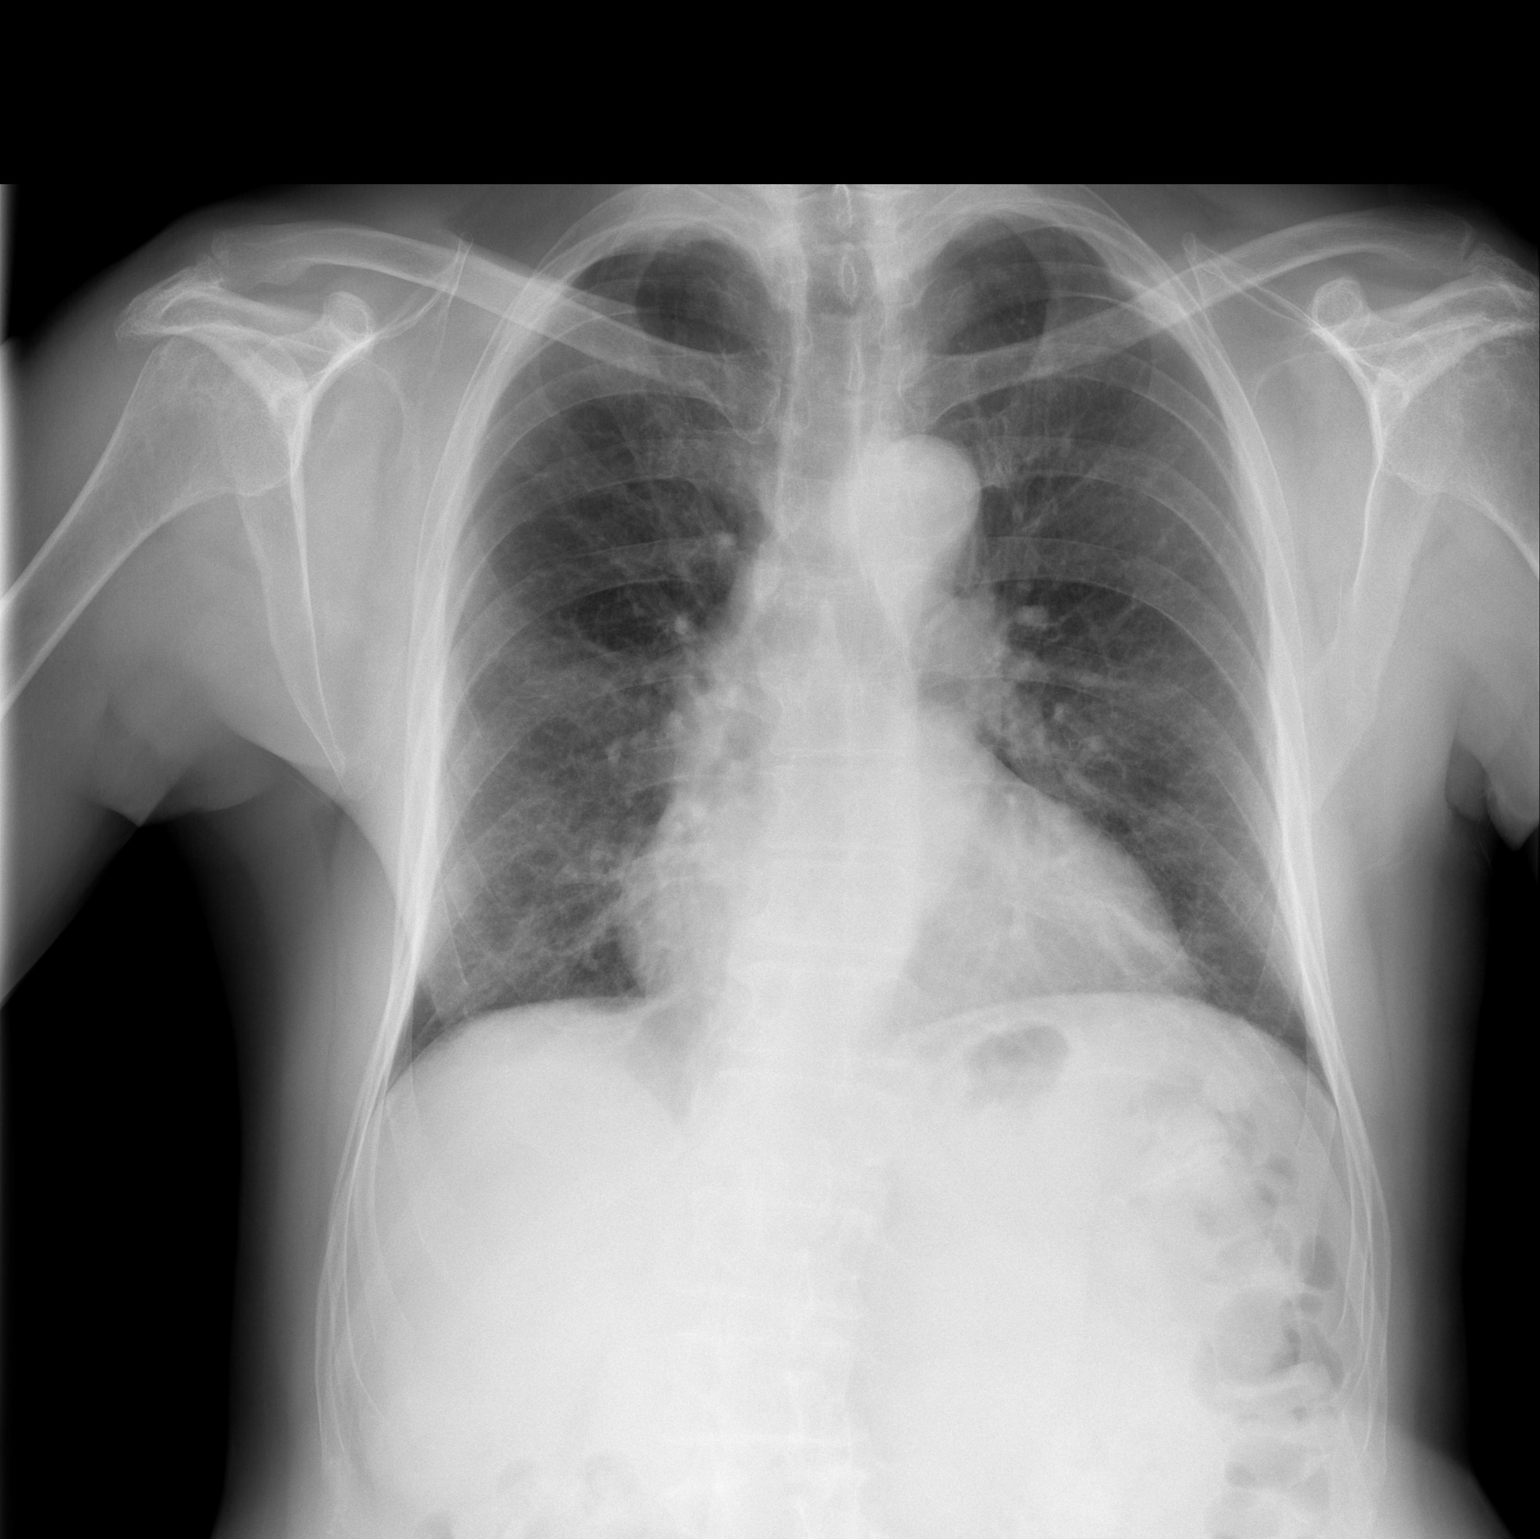

[w chest lat]
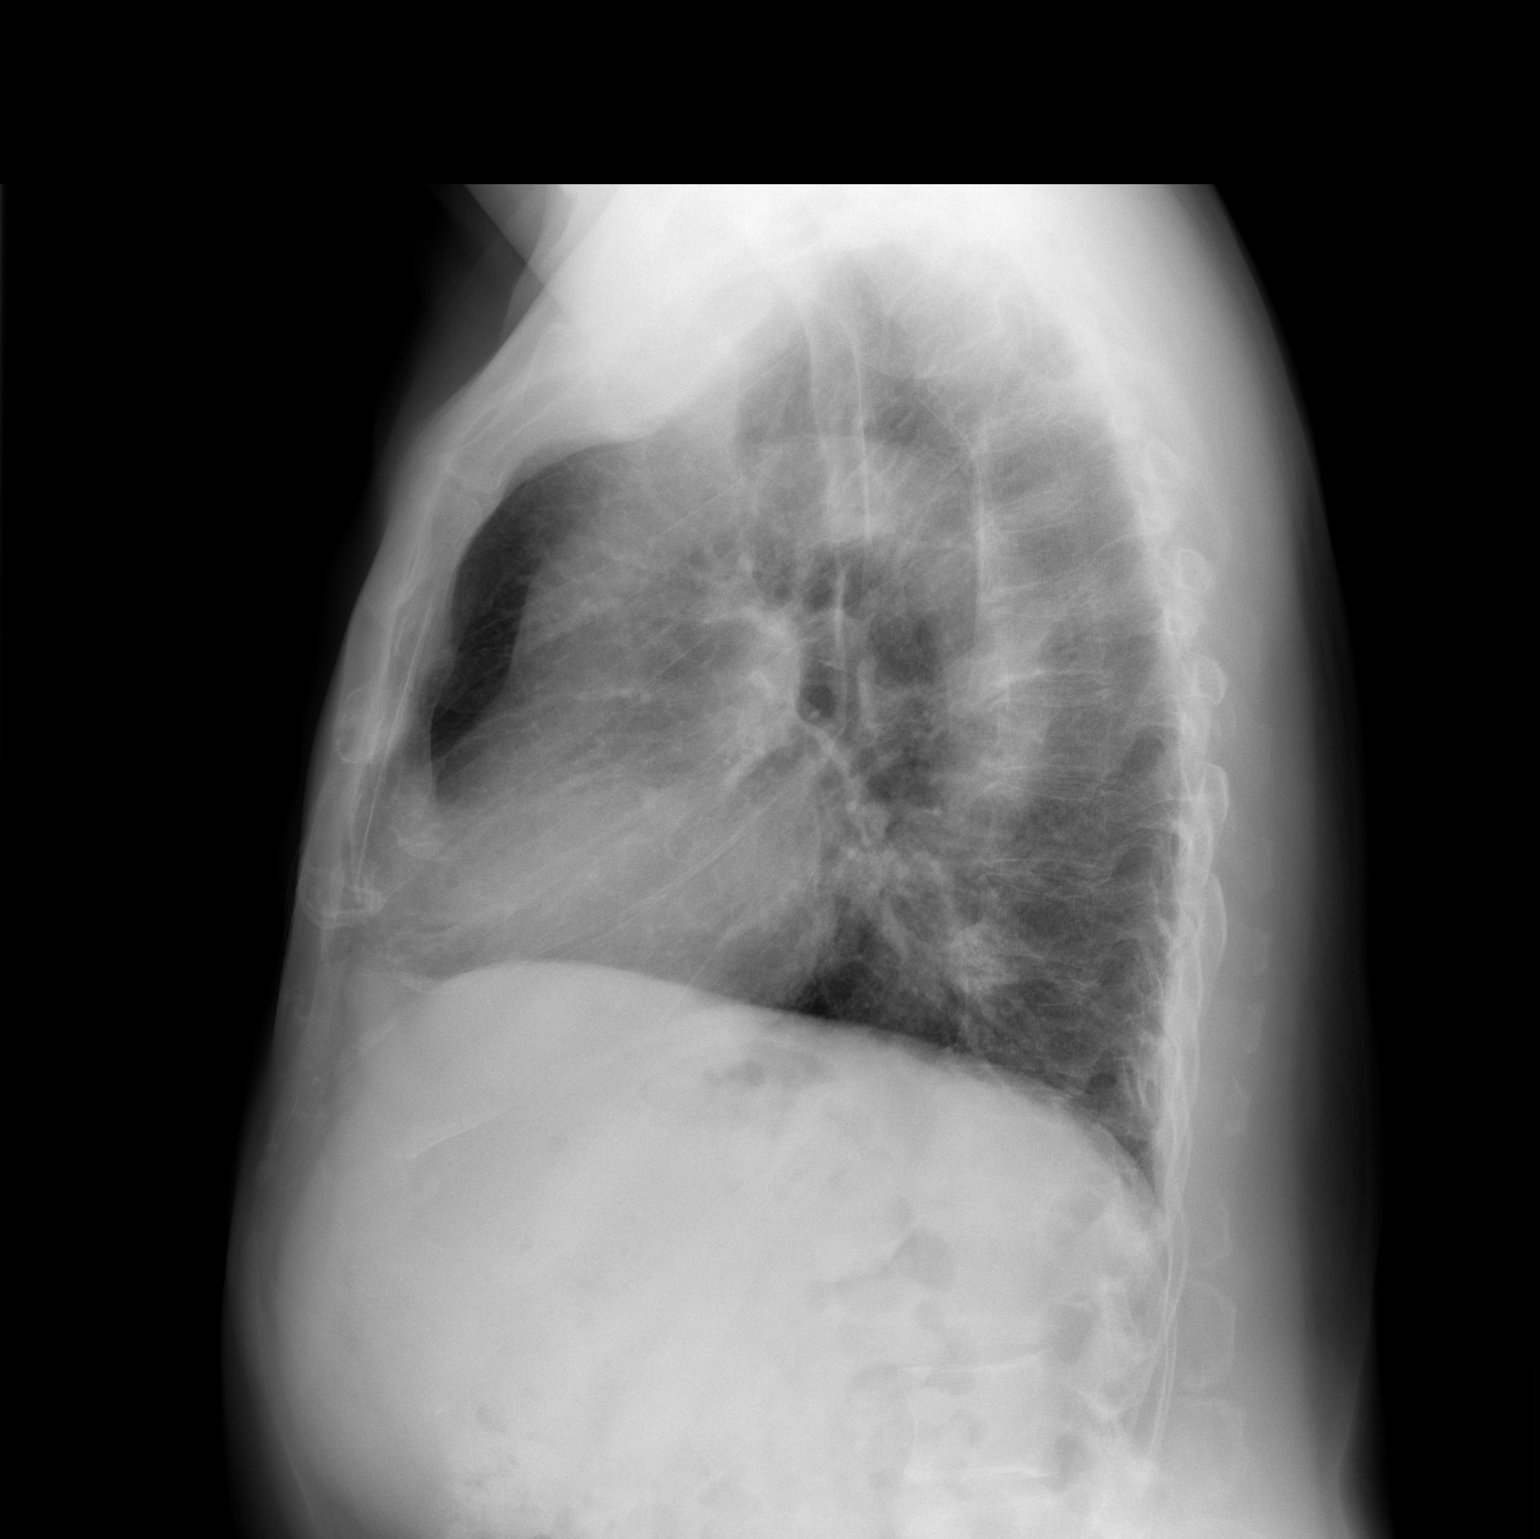

[2 of 2 positions shown; findings below may reference images not displayed]

FINDINGS: Mild enlargement the cardiac silhouette. Calcific atherosclerosis of
the aorta. Tortuosity of the aorta. No consolidation. No pleural
effusions. No pneumothorax.
IMPRESSION: 1. No acute cardiopulmonary disease.
2. Mild cardiomegaly.

## 2021-05-23 IMAGING — CR DG KNEE 1-2V PORT*R*
2 series · 2 of 2 positions shown · non-contrast
Comparison: 12/23/2019

CLINICAL DATA: Status post total knee arthroplasty.

EXAM:
PORTABLE RIGHT KNEE - 1-2 VIEW

[knee ap]
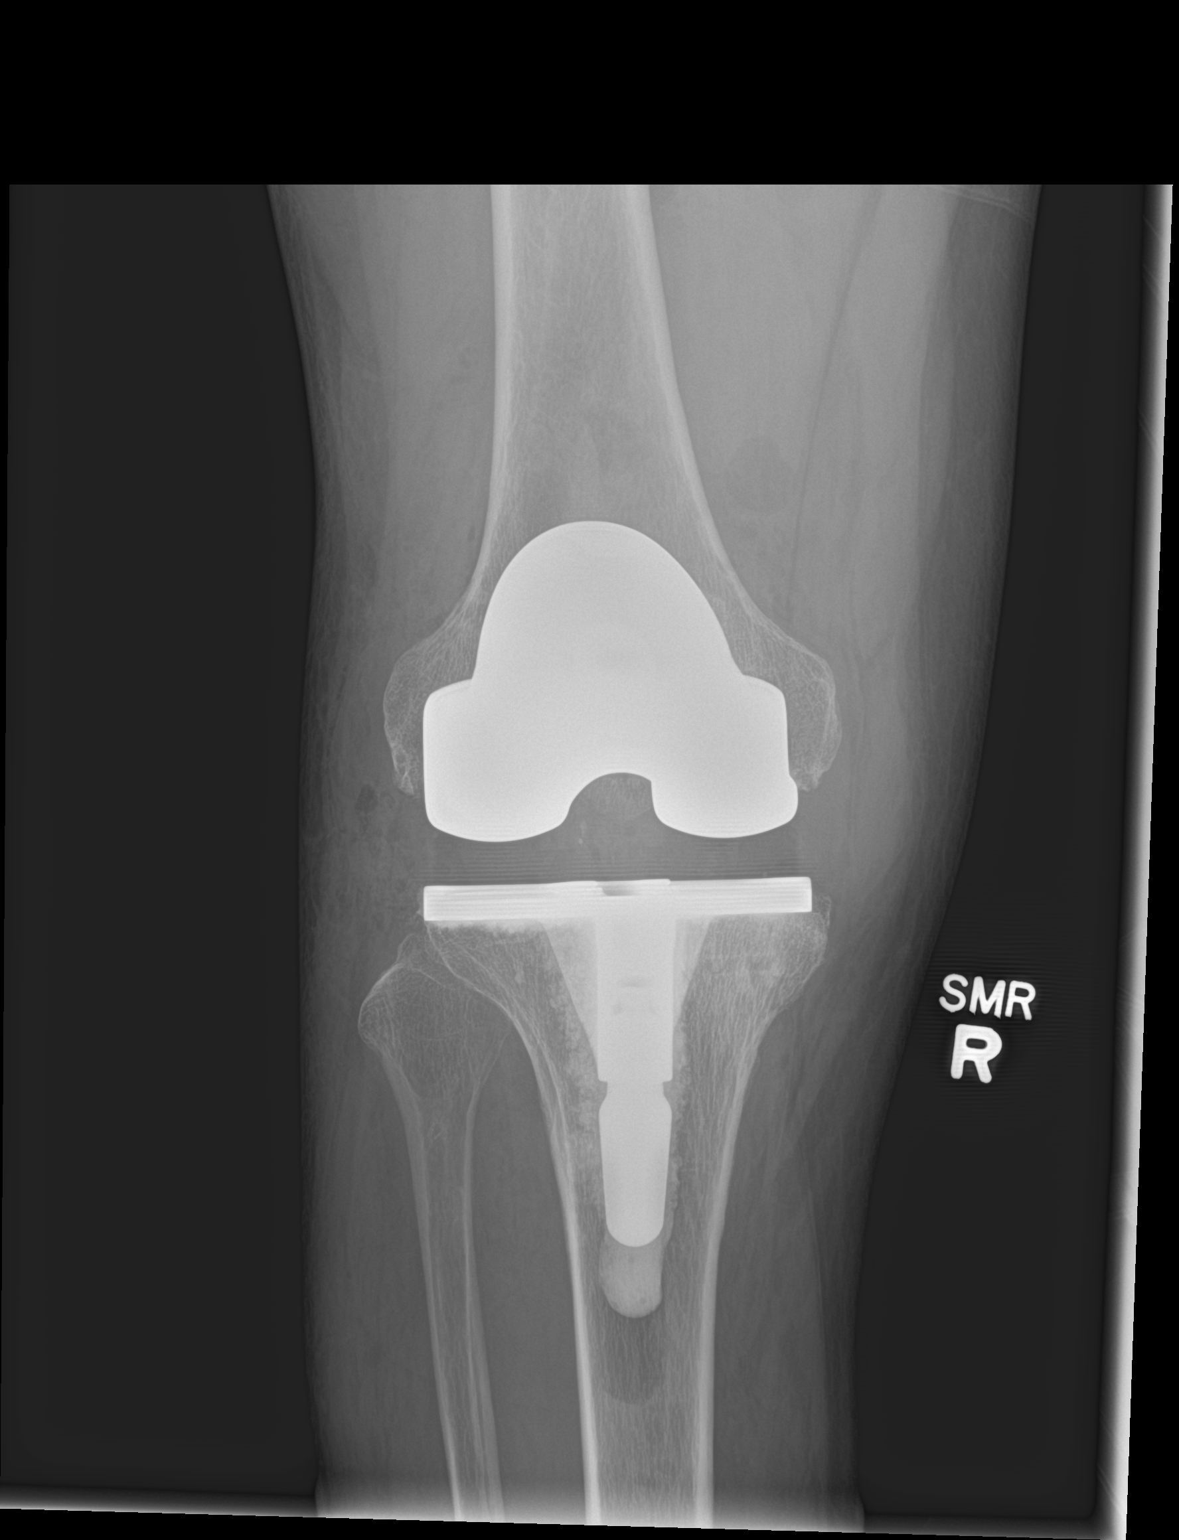

[knee lat]
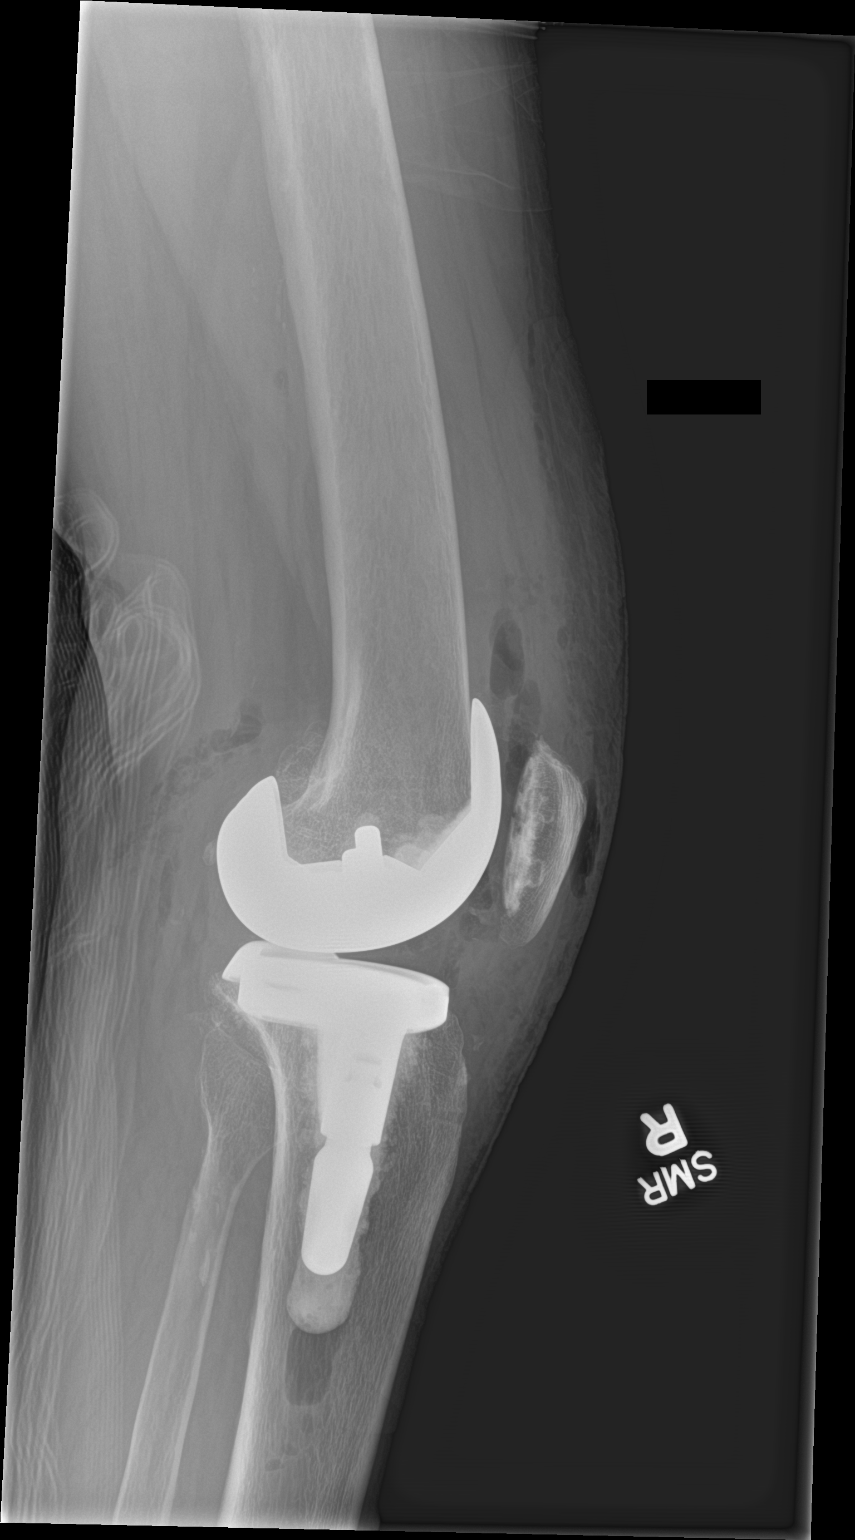

[2 of 2 positions shown; findings below may reference images not displayed]

FINDINGS: Postoperative changes from right total knee arthroplasty identified.
Hardware components are in anatomic alignment. No periprosthetic
fracture or dislocation. Gas is noted within the soft tissues
compatible with early postoperative change.
IMPRESSION: Status post right total knee arthroplasty. No immediate
postoperative complications identified.

## 2021-05-24 ENCOUNTER — Other Ambulatory Visit: Payer: Self-pay | Admitting: Physician Assistant

## 2021-05-24 ENCOUNTER — Telehealth: Payer: Self-pay | Admitting: Orthopaedic Surgery

## 2021-05-24 MED ORDER — OXYCODONE-ACETAMINOPHEN 5-325 MG PO TABS: 1.0000 | ORAL_TABLET | Freq: Three times a day (TID) | ORAL | 0 refills | Status: DC | PRN

## 2021-05-24 NOTE — Telephone Encounter (Signed)
Rx sent into pharm. Called patient to let him know. No answer & could not leave VM.

## 2021-05-24 NOTE — Telephone Encounter (Signed)
Sent in

## 2021-05-24 NOTE — Telephone Encounter (Signed)
Patient's wife Debarah Crape called advised patient need a refill for Rx Oxycodone. The number to contact Debarah Crape is [phone]

## 2021-05-25 ENCOUNTER — Ambulatory Visit: Payer: Self-pay | Admitting: Physical Therapy

## 2021-05-25 ENCOUNTER — Other Ambulatory Visit: Payer: Self-pay

## 2021-05-26 ENCOUNTER — Telehealth: Payer: Self-pay | Admitting: Orthopaedic Surgery

## 2021-05-26 NOTE — Telephone Encounter (Signed)
Pts wife Debarah Crape called and she speaks spanish so she would like a CB from [facility] in regards to correcting her husbands PT.   [phone]

## 2021-05-26 NOTE — Telephone Encounter (Signed)
I think the only thing he can do for now is his home exercises.

## 2021-05-26 NOTE — Telephone Encounter (Signed)
[facility] financial assistance expired in May 20 2021. Cannot schedule PT sessions. Would like to know what to do? He has reapplied not sure when it will be approved again.

## 2021-05-29 NOTE — Telephone Encounter (Signed)
yes

## 2021-05-30 ENCOUNTER — Ambulatory Visit: Payer: Self-pay | Admitting: Physical Therapy

## 2021-05-30 ENCOUNTER — Encounter: Payer: Self-pay | Admitting: Physical Therapy

## 2021-05-30 NOTE — Telephone Encounter (Signed)
Spoke to patient's wife. They would like excersies sent via mail. Unable to come pick them up.  Will mail out today.

## 2021-06-01 ENCOUNTER — Telehealth: Payer: Self-pay | Admitting: Orthopaedic Surgery

## 2021-06-01 ENCOUNTER — Ambulatory Visit: Payer: Self-pay | Admitting: Physical Therapy

## 2021-06-01 ENCOUNTER — Other Ambulatory Visit: Payer: Self-pay | Admitting: Physician Assistant

## 2021-06-01 MED ORDER — HYDROCODONE-ACETAMINOPHEN 5-325 MG PO TABS: 1.0000 | ORAL_TABLET | Freq: Three times a day (TID) | ORAL | 0 refills | Status: AC | PRN

## 2021-06-01 NOTE — Telephone Encounter (Signed)
Weaning to norco and I sent in

## 2021-06-01 NOTE — Telephone Encounter (Signed)
Patient aware.

## 2021-06-01 NOTE — Telephone Encounter (Signed)
Juan Hughes pts wife called stating he needs a refill of his percocet 5-325 mg rx. They would like a CB when this has been sent in please.   [phone]

## 2021-06-05 ENCOUNTER — Encounter: Payer: Self-pay | Admitting: Physical Therapy

## 2021-06-07 ENCOUNTER — Encounter: Payer: Self-pay | Admitting: Physical Therapy

## 2021-06-09 ENCOUNTER — Other Ambulatory Visit: Payer: Self-pay | Admitting: Physician Assistant

## 2021-06-09 ENCOUNTER — Telehealth: Payer: Self-pay | Admitting: Orthopaedic Surgery

## 2021-06-09 ENCOUNTER — Telehealth: Payer: Self-pay

## 2021-06-09 MED ORDER — OXYCODONE-ACETAMINOPHEN 5-325 MG PO TABS: 1.0000 | ORAL_TABLET | Freq: Three times a day (TID) | ORAL | 0 refills | Status: DC | PRN

## 2021-06-09 NOTE — Telephone Encounter (Signed)
I sent in percocet.  Make sure he is applying ice and elevating. If his pain is so severe, he may need to come in to be seen

## 2021-06-09 NOTE — Telephone Encounter (Signed)
Pt's wife Debarah Crape. She states he pain medication is not helping. Please call her back at [phone].

## 2021-06-09 NOTE — Telephone Encounter (Signed)
Patient aware.

## 2021-06-09 NOTE — Telephone Encounter (Signed)
Pt called and states that Percocet that was called in for him the pharmacy closed at 1 pm and he was not able to pick up and would like to know if you can resnd rx to [facility] [address] in [place] please?

## 2021-06-12 ENCOUNTER — Other Ambulatory Visit: Payer: Self-pay | Admitting: Physician Assistant

## 2021-06-12 ENCOUNTER — Telehealth: Payer: Self-pay | Admitting: Orthopaedic Surgery

## 2021-06-12 ENCOUNTER — Encounter: Payer: Self-pay | Admitting: Physical Therapy

## 2021-06-12 MED ORDER — OXYCODONE-ACETAMINOPHEN 5-325 MG PO TABS: 1.0000 | ORAL_TABLET | Freq: Three times a day (TID) | ORAL | 0 refills | Status: DC | PRN

## 2021-06-12 NOTE — Telephone Encounter (Signed)
Sent in

## 2021-06-12 NOTE — Telephone Encounter (Signed)
Pt's wife Rosemarie Ax called requesting [facility] to resend medication. [name]. Please call pt's wife to figure out what pharmacy it can be sent to [phone].

## 2021-06-13 ENCOUNTER — Other Ambulatory Visit: Payer: Self-pay | Admitting: Physician Assistant

## 2021-06-13 ENCOUNTER — Telehealth: Payer: Self-pay | Admitting: Orthopaedic Surgery

## 2021-06-13 MED ORDER — OXYCODONE-ACETAMINOPHEN 5-325 MG PO TABS: 1.0000 | ORAL_TABLET | Freq: Three times a day (TID) | ORAL | 0 refills | Status: AC | PRN

## 2021-06-13 NOTE — Telephone Encounter (Signed)
Patients wife aware

## 2021-06-13 NOTE — Telephone Encounter (Signed)
Wasn't this the first place I sent last week before getting the message to send to [facility]???  I am so confused about this

## 2021-06-13 NOTE — Telephone Encounter (Signed)
Just sent

## 2021-06-13 NOTE — Telephone Encounter (Signed)
Juan Hughes with pts [facility] pharmacy called stating the oxycodone rx that was sent to the [facility] in [place] needs to be switched to their pharmacy. Juan Hughes would like our office to call the pt to update him when we send the rx to [facility].   [phone]

## 2021-06-13 NOTE — Telephone Encounter (Signed)
Yes please send to [facility] in [place]

## 2021-06-13 NOTE — Telephone Encounter (Signed)
That was sent in last week (Friday I believe) before the rx to [facility].  Do I need to send again?

## 2021-06-13 NOTE — Telephone Encounter (Signed)
Wife states she would prefer pharmacy [facility] - [place], [place].

## 2021-06-14 ENCOUNTER — Encounter: Payer: Self-pay | Admitting: Orthopaedic Surgery

## 2021-06-14 ENCOUNTER — Encounter: Payer: Self-pay | Admitting: Physical Therapy

## 2021-06-14 ENCOUNTER — Ambulatory Visit (INDEPENDENT_AMBULATORY_CARE_PROVIDER_SITE_OTHER): Payer: Self-pay

## 2021-06-14 ENCOUNTER — Ambulatory Visit (INDEPENDENT_AMBULATORY_CARE_PROVIDER_SITE_OTHER): Payer: Self-pay | Admitting: Orthopaedic Surgery

## 2021-06-14 ENCOUNTER — Other Ambulatory Visit: Payer: Self-pay

## 2021-06-14 DIAGNOSIS — Z96652 Presence of left artificial knee joint: Secondary | ICD-10-CM

## 2021-06-14 NOTE — Progress Notes (Signed)
° °  Post-Op Visit Note   Patient: Juan Hughes           Date of Birth: [birth year]           MRN: [id number] Visit Date: 06/14/2021 PCP: Gorden Harms, PA-C   Assessment & Plan:  Chief Complaint:  Chief Complaint  Patient presents with   Left Knee - Routine Post Op   Visit Diagnoses:  1. Hx of total knee replacement, left     Plan: Patient is 6 weeks status post left total knee replacement.  She is doing well overall.  Complains of swelling for the most part.  He is doing home health PT and exercise bike.  He reports constant mild to moderate pain.  Examination of the left knee shows a fully healed surgical scar.  Range of motion is 0 to 92 degrees.  Stable to varus valgus stress.  Overall patient is recovering well.  I recommend that he take some Advil to help with inflammation and mild pain and just the painkillers for breakthrough pain.  He will continue to work on range of motion strengthening with PT.  Recheck in 6 weeks.  Follow-Up Instructions: Return in about 6 weeks (around 07/26/2021).   Orders:  Orders Placed This Encounter  Procedures   XR Knee 1-2 Views Left   No orders of the defined types were placed in this encounter.   Imaging: XR Knee 1-2 Views Left  Result Date: 06/14/2021 Stable total knee replacement in good alignment.    PMFS History: Patient Active Problem List   Diagnosis Date Noted   Status post total left knee replacement 05/01/2021   Hx of total knee replacement, right 02/16/2021   Primary osteoarthritis of left knee 02/16/2021   Status post total knee replacement, right 02/24/2020   Osteoarthritis of right knee 02/23/2020   Nonischemic cardiomyopathy (HCC)    HFrEF (heart failure with reduced ejection fraction) (HCC)    Stroke (HCC) 06/18/2019   Hypertension    Left-sided weakness    Hypokalemia    Tobacco abuse    Past Medical History:  Diagnosis Date   CHF (congestive heart failure) (HCC)    last ef 40-45% echo 05/2019    GERD (gastroesophageal reflux disease)    HFrEF (heart failure with reduced ejection fraction) (HCC)    Hypertension    Osteoarthritis of right knee    Stroke (HCC) 06/2019   left sided weakness    Family History  Problem Relation Age of Onset   Hypertension Mother     Past Surgical History:  Procedure Laterality Date   INNER EAR SURGERY     as teenager   TOTAL KNEE ARTHROPLASTY Right 02/24/2020   Procedure: RIGHT TOTAL KNEE ARTHROPLASTY;  Surgeon: Tarry Kos, MD;  Location: [facility];  Service: Orthopedics;  Laterality: Right;   TOTAL KNEE ARTHROPLASTY Left 05/01/2021   Procedure: LEFT TOTAL KNEE ARTHROPLASTY;  Surgeon: Tarry Kos, MD;  Location: [facility];  Service: Orthopedics;  Laterality: Left;   Social History   Occupational History   Not on file  Tobacco Use   Smoking status: Former    Types: Cigars    Quit date: 06/22/2019    Years since quitting: 1.9   Smokeless tobacco: Never  Vaping Use   Vaping Use: Never used  Substance and Sexual Activity   Alcohol use: Never   Drug use: Never   Sexual activity: Yes

## 2021-06-19 ENCOUNTER — Encounter: Payer: Self-pay | Admitting: Physical Therapy

## 2021-06-21 ENCOUNTER — Encounter: Payer: Self-pay | Admitting: Physical Therapy

## 2021-06-26 ENCOUNTER — Encounter: Payer: Self-pay | Admitting: Physical Therapy

## 2021-06-29 ENCOUNTER — Encounter: Payer: Self-pay | Admitting: Physical Therapy

## 2021-07-04 ENCOUNTER — Encounter: Payer: Self-pay | Admitting: Physical Therapy

## 2021-07-06 ENCOUNTER — Encounter: Payer: Self-pay | Admitting: Physical Therapy

## 2021-07-11 ENCOUNTER — Encounter: Payer: Self-pay | Admitting: Physical Therapy

## 2021-07-13 ENCOUNTER — Encounter: Payer: Self-pay | Admitting: Physical Therapy

## 2021-07-18 ENCOUNTER — Encounter: Payer: Self-pay | Admitting: Physical Therapy

## 2021-07-26 ENCOUNTER — Ambulatory Visit (INDEPENDENT_AMBULATORY_CARE_PROVIDER_SITE_OTHER): Payer: Self-pay | Admitting: Orthopaedic Surgery

## 2021-07-26 ENCOUNTER — Other Ambulatory Visit: Payer: Self-pay

## 2021-07-26 DIAGNOSIS — Z96652 Presence of left artificial knee joint: Secondary | ICD-10-CM

## 2021-07-26 NOTE — Progress Notes (Signed)
? ?  Post-Op Visit Note ?  ?Patient: Juan Hughes           ?Date of Birth: Oct 04, 1955           ?MRN: [id number] ?Visit Date: 07/26/2021 ?PCP: Gorden Harms, PA-C ? ? ?Assessment & Plan: ? ?Chief Complaint:  ?Chief Complaint  ?Patient presents with  ? Left Knee - Pain, Routine Post Op, Follow-up  ? ?Visit Diagnoses:  ?1. Status post total left knee replacement   ? ? ?Plan: Patient is 34-month status post left total knee replacement.  Overall doing well.  Ambulates with a cane mainly due to back pain and some residual weakness in the left leg.  He is doing home exercises at this time.  He has been discharged from outpatient PT. ? ?Examination of the left knee shows a fully healed surgical scar.  He has some mild residual swelling throughout the extremity.  No neurovascular compromise.  Range of motion 5 to 115 degrees.  Stable to varus valgus. ? ?Overall the patient is recovering and doing well.  Dental prophylaxis reinforced.  Recheck in 3 months with two-view x-rays of the left knee. ? ?Follow-Up Instructions: Return in about 3 months (around 10/26/2021).  ? ?Orders:  ?No orders of the defined types were placed in this encounter. ? ?No orders of the defined types were placed in this encounter. ? ? ?Imaging: ?No results found. ? ?PMFS History: ?Patient Active Problem List  ? Diagnosis Date Noted  ? Status post total left knee replacement 05/01/2021  ? Hx of total knee replacement, right 02/16/2021  ? Primary osteoarthritis of left knee 02/16/2021  ? Status post total knee replacement, right 02/24/2020  ? Osteoarthritis of right knee 02/23/2020  ? Nonischemic cardiomyopathy (HCC)   ? HFrEF (heart failure with reduced ejection fraction) (HCC)   ? Stroke [facility]) 06/18/2019  ? Hypertension   ? Left-sided weakness   ? Hypokalemia   ? Tobacco abuse   ? ?Past Medical History:  ?Diagnosis Date  ? CHF (congestive heart failure) (HCC)   ? last ef 40-45% echo 05/2019  ? GERD (gastroesophageal reflux disease)   ? HFrEF  (heart failure with reduced ejection fraction) (HCC)   ? Hypertension   ? Osteoarthritis of right knee   ? Stroke [facility]) 06/2019  ? left sided weakness  ?  ?Family History  ?Problem Relation Age of Onset  ? Hypertension Mother   ?  ?Past Surgical History:  ?Procedure Laterality Date  ? INNER EAR SURGERY    ? as teenager  ? TOTAL KNEE ARTHROPLASTY Right 02/24/2020  ? Procedure: RIGHT TOTAL KNEE ARTHROPLASTY;  Surgeon: Tarry Kos, MD;  Location: [facility];  Service: Orthopedics;  Laterality: Right;  ? TOTAL KNEE ARTHROPLASTY Left 05/01/2021  ? Procedure: LEFT TOTAL KNEE ARTHROPLASTY;  Surgeon: Tarry Kos, MD;  Location[facility];  Service: Orthopedics;  Laterality: Left;  ? ?Social History  ? ?Occupational History  ? Not on file  ?Tobacco Use  ? Smoking status: Former  ?  Types: Cigars  ?  Quit date: 06/22/2019  ?  Years since quitting: 2.0  ? Smokeless tobacco: Never  ?Vaping Use  ? Vaping Use: Never used  ?Substance and Sexual Activity  ? Alcohol use: Never  ? Drug use: Never  ? Sexual activity: Yes  ? ? ? ?

## 2021-10-26 ENCOUNTER — Ambulatory Visit (INDEPENDENT_AMBULATORY_CARE_PROVIDER_SITE_OTHER): Payer: Self-pay

## 2021-10-26 ENCOUNTER — Ambulatory Visit (INDEPENDENT_AMBULATORY_CARE_PROVIDER_SITE_OTHER): Payer: Self-pay | Admitting: Orthopaedic Surgery

## 2021-10-26 ENCOUNTER — Encounter: Payer: Self-pay | Admitting: Orthopaedic Surgery

## 2021-10-26 DIAGNOSIS — Z96652 Presence of left artificial knee joint: Secondary | ICD-10-CM

## 2021-10-26 NOTE — Progress Notes (Signed)
Post-Op Visit Note   Patient: Juan Hughes           Date of Birth: Dec 20, 1955           MRN: [id number] Visit Date: 10/26/2021 PCP: [name], PA-C   Assessment & Plan:  Chief Complaint:  Chief Complaint  Patient presents with   Left Knee - Routine Post Op   Visit Diagnoses:  1. Status post total left knee replacement     Plan: Patient is a very pleasant 66 year old Spanish-speaking gentleman who comes in today 6 months status post left total knee replacement 05/01/2021.  He has been doing well.  He has minimal pain and is taking Tylenol as needed.  He has noticed slight persistent swelling.  No calf pain.  No fevers or chills or any other systemic symptoms.  Examination of his left knee reveals a fully healed surgical scar without complication.  Range of motion 0 to 120 degrees.  He stable to valgus varus stress.  He is neurovascular intact distally.  At this point, he may continue to advance activity as tolerated.  Dental prophylaxis reinforced.  Currently not concerned for infection and have reassured him that he may continue to swell with activity for the next several months but this should improve with time.  He understands and agrees.  Follow-up in 6 months for repeat evaluation and 2 view x-rays of the left knee.  Call with concerns or questions in the meantime.  Follow-Up Instructions: Return in about 6 months (around 04/27/2022).   Orders:  Orders Placed This Encounter  Procedures   XR Knee 1-2 Views Left   No orders of the defined types were placed in this encounter.   Imaging: No results found.  PMFS History: Patient Active Problem List   Diagnosis Date Noted   Status post total left knee replacement 05/01/2021   Hx of total knee replacement, right 02/16/2021   Primary osteoarthritis of left knee 02/16/2021   Status post total knee replacement, right 02/24/2020   Osteoarthritis of right knee 02/23/2020   Nonischemic cardiomyopathy ([place])     HFrEF (heart failure with reduced ejection fraction) ([place])    Stroke ([place]) 06/18/2019   Hypertension    Left-sided weakness    Hypokalemia    Tobacco abuse    Past Medical History:  Diagnosis Date   CHF (congestive heart failure) ([place])    last ef 40-45% echo 05/2019   GERD (gastroesophageal reflux disease)    HFrEF (heart failure with reduced ejection fraction) ([place])    Hypertension    Osteoarthritis of right knee    Stroke ([place]) 06/2019   left sided weakness    Family History  Problem Relation Age of Onset   Hypertension Mother     Past Surgical History:  Procedure Laterality Date   INNER EAR SURGERY     as teenager   TOTAL KNEE ARTHROPLASTY Right 02/24/2020   Procedure: RIGHT TOTAL KNEE ARTHROPLASTY;  Surgeon: Leandrew Koyanagi, MD;  Location: [place];  Service: Orthopedics;  Laterality: Right;   TOTAL KNEE ARTHROPLASTY Left 05/01/2021   Procedure: LEFT TOTAL KNEE ARTHROPLASTY;  Surgeon: Leandrew Koyanagi, MD;  Location: [place];  Service: Orthopedics;  Laterality: Left;   Social History   Occupational History   Not on file  Tobacco Use   Smoking status: Former    Types: Cigars    Quit date: 06/22/2019    Years since quitting: 2.3   Smokeless tobacco: Never  Vaping  Use   Vaping Use: Never used  Substance and Sexual Activity   Alcohol use: Never   Drug use: Never   Sexual activity: Yes

## 2022-04-27 ENCOUNTER — Encounter: Payer: Self-pay | Admitting: Orthopaedic Surgery

## 2022-04-27 ENCOUNTER — Ambulatory Visit (INDEPENDENT_AMBULATORY_CARE_PROVIDER_SITE_OTHER): Payer: Self-pay | Admitting: Physician Assistant

## 2022-04-27 ENCOUNTER — Ambulatory Visit (INDEPENDENT_AMBULATORY_CARE_PROVIDER_SITE_OTHER): Payer: Self-pay

## 2022-04-27 DIAGNOSIS — Z96652 Presence of left artificial knee joint: Secondary | ICD-10-CM

## 2022-04-27 NOTE — Progress Notes (Signed)
X-rays demonstrate  Post-Op Visit Note   Patient: Juan Hughes           Date of Birth: 01/10/1956           MRN: [id number] Visit Date: 04/27/2022 PCP: Gorden Harms, PA-C   Assessment & Plan:  Chief Complaint:  Chief Complaint  Patient presents with   Left Knee - Routine Post Op   Visit Diagnoses:  1. Status post total left knee replacement     Plan: Patient is a pleasant 66 year old gentleman who comes in today with a Spanish interpreter.  He is 1 year out left total knee replacement 05/01/2021.  He is doing great.  No complaints of knee pain but notes she is still working on endurance.  Examination left knee reveals no effusion.  No joint line tenderness.  Calf is soft nontender.  He is neurovascular intact distally.  At this point, he will continue to advance with activity as tolerated.  Dental prophylaxis reinforced.  Follow-up in 1 year for recheck.  Call with concerns or questions.  Follow-Up Instructions: Return in about 1 year (around 04/28/2023).   Orders:  Orders Placed This Encounter  Procedures   XR Knee 1-2 Views Left   No orders of the defined types were placed in this encounter.   Imaging: No results found.  PMFS History: Patient Active Problem List   Diagnosis Date Noted   Status post total left knee replacement 05/01/2021   Hx of total knee replacement, right 02/16/2021   Primary osteoarthritis of left knee 02/16/2021   Status post total knee replacement, right 02/24/2020   Osteoarthritis of right knee 02/23/2020   Nonischemic cardiomyopathy (HCC)    HFrEF (heart failure with reduced ejection fraction) (HCC)    Stroke (HCC) 06/18/2019   Hypertension    Left-sided weakness    Hypokalemia    Tobacco abuse    Past Medical History:  Diagnosis Date   CHF (congestive heart failure) (HCC)    last ef 40-45% echo 05/2019   GERD (gastroesophageal reflux disease)    HFrEF (heart failure with reduced ejection fraction) (HCC)    Hypertension     Osteoarthritis of right knee    Stroke (HCC) 06/2019   left sided weakness    Family History  Problem Relation Age of Onset   Hypertension Mother     Past Surgical History:  Procedure Laterality Date   INNER EAR SURGERY     as teenager   TOTAL KNEE ARTHROPLASTY Right 02/24/2020   Procedure: RIGHT TOTAL KNEE ARTHROPLASTY;  Surgeon: Tarry Kos, MD;  Location: [facility];  Service: Orthopedics;  Laterality: Right;   TOTAL KNEE ARTHROPLASTY Left 05/01/2021   Procedure: LEFT TOTAL KNEE ARTHROPLASTY;  Surgeon: Tarry Kos, MD;  Location[facility];  Service: Orthopedics;  Laterality: Left;   Social History   Occupational History   Not on file  Tobacco Use   Smoking status: Former    Types: Cigars    Quit date: 06/22/2019    Years since quitting: 2.8   Smokeless tobacco: Never  Vaping Use   Vaping Use: Never used  Substance and Sexual Activity   Alcohol use: Never   Drug use: Never   Sexual activity: Yes

## 2022-08-21 ENCOUNTER — Encounter: Payer: Self-pay | Admitting: *Deleted

## 2022-08-28 NOTE — Progress Notes (Unsigned)
  Cardiology Office Note:   Date:  08/28/2022  ID:  Juan Hughes, DOB 08-23-1955, MRN [id number]  History of Present Illness:   Juan Hughes is a 67 y.o. male with a past medical history of hypertension, nonischemic cardiomyopathy, normalized EF on subsequent echo, CVA in 06/2019, who presents today for follow-up.  He previously was admitted to the hospital in 06/2019 with left-sided weakness.  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  Echocardiogram revealed reduced ejection fraction EF of 40-45%.  Lexiscan Myoview did not show any ischemia or scar.  His underlying cardiomyopathy was deemed likely nonischemic secondary to uncontrolled blood pressure.  Cardiac monitor was placed and did not show any evidence of atrial flutter or atrial fibrillation.  He was last seen in clinic 02/09/2021 by Dr. Azucena Cecil stating that blood pressures have been better controlled on his current therapy.  He was concerned that he would need surgery in the near future on his knee.  He had denied any chest pain had occasional lower extremity edema, but had no other associated symptoms.  There were no changes to his medications and he was scheduled for repeat echocardiogram.  If echo was without gross abnormalities he was okay to proceed with the surgical procedure from a cardiac perspective.  He underwent total left knee replacement on 05/01/2021.  No postoperative complications were noted.  He returns to clinic today ROS: ***  Studies Reviewed:    EKG:  ***  TTE 03/16/21 1. Left ventricular ejection fraction, by estimation, is 55 to 60%. The  left ventricle has normal function. The left ventricle has no regional  wall motion abnormalities. Left ventricular diastolic parameters are  consistent with Grade I diastolic  dysfunction (impaired relaxation). The average left ventricular global  longitudinal strain is -16.5 %. The global longitudinal strain is normal.   2. Right ventricular systolic  function is normal. The right ventricular  size is normal.   3. The mitral valve is normal in structure. Mild mitral valve  regurgitation. No evidence of mitral stenosis.   4. The aortic valve is normal in structure. Aortic valve regurgitation is  not visualized. No aortic stenosis is present.   5. The inferior vena cava is normal in size with greater than 50%  respiratory variability, suggesting right atrial pressure of 3 mmHg.    Risk Assessment/Calculations:     No BP recorded.  {Refresh Note OR Click here to enter BP  :1}***        Physical Exam:   VS:  There were no vitals taken for this visit.   Wt Readings from Last 3 Encounters:  04/27/21 174 lb 1.6 oz (79 kg)  02/09/21 173 lb (78.5 kg)  08/08/20 159 lb (72.1 kg)     GEN: Well nourished, well developed in no acute distress NECK: No JVD; No carotid bruits CARDIAC: ***RRR, no murmurs, rubs, gallops RESPIRATORY:  Clear to auscultation without rales, wheezing or rhonchi  ABDOMEN: Soft, non-tender, non-distended EXTREMITIES:  No edema; No deformity   ASSESSMENT AND PLAN:   ***    {Are you ordering a CV Procedure (e.g. stress test, cath, DCCV, TEE, etc)?   Press F2        :[id number]}   Signed, Kadajah Kjos, NP

## 2022-08-29 ENCOUNTER — Ambulatory Visit: Payer: Self-pay | Attending: Cardiology | Admitting: Cardiology

## 2022-08-29 ENCOUNTER — Other Ambulatory Visit
Admission: RE | Admit: 2022-08-29 | Discharge: 2022-08-29 | Disposition: A | Discharge status: Home / Self Care | Payer: Self-pay | Source: Ambulatory Visit | Attending: Cardiology | Admitting: Cardiology

## 2022-08-29 ENCOUNTER — Encounter: Payer: Self-pay | Admitting: Cardiology

## 2022-08-29 ENCOUNTER — Telehealth: Payer: Self-pay | Admitting: Cardiology

## 2022-08-29 VITALS — BP 202/120 | HR 79 | Ht 63.0 in | Wt 187.2 lb

## 2022-08-29 DIAGNOSIS — I10 Essential (primary) hypertension: Secondary | ICD-10-CM | POA: Insufficient documentation

## 2022-08-29 DIAGNOSIS — Z8673 Personal history of transient ischemic attack (TIA), and cerebral infarction without residual deficits: Secondary | ICD-10-CM

## 2022-08-29 DIAGNOSIS — Z91148 Patient's other noncompliance with medication regimen for other reason: Secondary | ICD-10-CM

## 2022-08-29 DIAGNOSIS — I428 Other cardiomyopathies: Secondary | ICD-10-CM

## 2022-08-29 LAB — CBC
HCT: 52 % (ref 39.0–52.0)
Hemoglobin: 16.8 g/dL (ref 13.0–17.0)
MCH: 27.6 pg (ref 26.0–34.0)
MCHC: 32.3 g/dL (ref 30.0–36.0)
MCV: 85.5 fL (ref 80.0–100.0)
Platelets: 197 10*3/uL (ref 150–400)
RBC: 6.08 MIL/uL — ABNORMAL HIGH (ref 4.22–5.81)
RDW: 14.6 % (ref 11.5–15.5)
WBC: 5.5 10*3/uL (ref 4.0–10.5)
nRBC: 0 % (ref 0.0–0.2)

## 2022-08-29 LAB — COMPREHENSIVE METABOLIC PANEL
ALT: 23 U/L (ref 0–44)
AST: 20 U/L (ref 15–41)
Albumin: 4.1 g/dL (ref 3.5–5.0)
Alkaline Phosphatase: 96 U/L (ref 38–126)
Anion gap: 8 (ref 5–15)
BUN: 21 mg/dL (ref 8–23)
CO2: 22 mmol/L (ref 22–32)
Calcium: 9.1 mg/dL (ref 8.9–10.3)
Chloride: 109 mmol/L (ref 98–111)
Creatinine, Ser: 1.41 mg/dL — ABNORMAL HIGH (ref 0.61–1.24)
GFR, Estimated: 56 mL/min — ABNORMAL LOW (ref >60)
Glucose, Bld: 107 mg/dL — ABNORMAL HIGH (ref 70–99)
Potassium: 3.7 mmol/L (ref 3.5–5.1)
Sodium: 139 mmol/L (ref 135–145)
Total Bilirubin: 0.7 mg/dL (ref 0.3–1.2)
Total Protein: 7.7 g/dL (ref 6.5–8.1)

## 2022-08-29 LAB — LIPID PANEL
Cholesterol: 274 mg/dL — ABNORMAL HIGH (ref 0–200)
HDL: 55 mg/dL (ref >40)
LDL Cholesterol: 175 mg/dL — ABNORMAL HIGH (ref 0–99)
Total CHOL/HDL Ratio: 5 RATIO
Triglycerides: 222 mg/dL — ABNORMAL HIGH (ref <150)
VLDL: 44 mg/dL — ABNORMAL HIGH (ref 0–40)

## 2022-08-29 MED ORDER — SPIRONOLACTONE 25 MG PO TABS
25.0000 mg | ORAL_TABLET | Freq: Every day | ORAL | 0 refills | Status: DC
Start: 2022-08-29 — End: 2022-08-29

## 2022-08-29 MED ORDER — CARVEDILOL 25 MG PO TABS: 25.0000 mg | ORAL_TABLET | Freq: Two times a day (BID) | ORAL | 0 refills | Status: DC

## 2022-08-29 MED ORDER — HYDRALAZINE HCL 50 MG PO TABS: 50.0000 mg | ORAL_TABLET | Freq: Three times a day (TID) | ORAL | 0 refills | Status: DC

## 2022-08-29 MED ORDER — LOSARTAN POTASSIUM 100 MG PO TABS: 100.0000 mg | ORAL_TABLET | Freq: Every day | ORAL | 0 refills | Status: DC

## 2022-08-29 MED ORDER — SPIRONOLACTONE 25 MG PO TABS: 25.0000 mg | ORAL_TABLET | Freq: Every day | ORAL | 0 refills | Status: DC

## 2022-08-29 MED ORDER — ATORVASTATIN CALCIUM 80 MG PO TABS: 80.0000 mg | ORAL_TABLET | Freq: Every day | ORAL | 0 refills | Status: DC

## 2022-08-29 NOTE — Telephone Encounter (Signed)
Debarah Crape states she was informed that patient can no longer use [facility] because he is not being seen at their clinic.  Requested Prescriptions   Signed Prescriptions Disp Refills   losartan (COZAAR) 100 MG tablet 90 tablet 0    Sig: Take 1 tablet (100 mg total) by mouth daily.    Authorizing Provider: Debbe Odea    Ordering User: NEWCOMER MCCLAIN, Analena Gama L   carvedilol (COREG) 25 MG tablet 180 tablet 0    Sig: Take 1 tablet (25 mg total) by mouth 2 (two) times daily.    Authorizing Provider: Debbe Odea    Ordering User: Thayer Headings, Kenidi Elenbaas L   spironolactone (ALDACTONE) 25 MG tablet 90 tablet 0    Sig: Take 1 tablet (25 mg total) by mouth daily.    Authorizing Provider: Debbe Odea    Ordering User: Thayer Headings, Maimouna Rondeau L   hydrALAZINE (APRESOLINE) 50 MG tablet 270 tablet 0    Sig: Take 1 tablet (50 mg total) by mouth 3 (three) times daily.    Authorizing Provider: Debbe Odea    Ordering User: NEWCOMER MCCLAIN, Quintasia Theroux L   atorvastatin (LIPITOR) 80 MG tablet 90 tablet 0    Sig: Take 1 tablet (80 mg total) by mouth daily at 6 PM.    Authorizing Provider: Debbe Odea    Ordering User: Thayer Headings, Maysin Carstens L

## 2022-08-29 NOTE — Telephone Encounter (Signed)
*  STAT* If patient is at the pharmacy, call can be transferred to refill team.   1. Which medications need to be refilled? (please list name of each medication and dose if known)  losartan (COZAAR) 100 MG tablet hydrALAZINE (APRESOLINE) 50 MG tablet carvedilol (COREG) 25 MG tablet atorvastatin (LIPITOR) 80 MG tablet  2. Which pharmacy/location (including street and city if local pharmacy) is medication to be sent to?[id number] - [place] ([place]), [place] - [address]   3. Do they need a 30 day or 90 day supply?90 Day Supply

## 2022-08-29 NOTE — Patient Instructions (Addendum)
Medication Instructions:   Your physician recommends that you continue on your current medications as directed. Please refer to the Current Medication list given to you today.  *If you need a refill on your cardiac medications before your next appointment, please call your pharmacy*  Lab Work:  Your physician recommends that you have lab work today at [facility].  CBC CMET LIPID  If you have labs (blood work) drawn today and your tests are completely normal, you will receive your results only by: MyChart Message (if you have MyChart) OR A paper copy in the mail If you have any lab test that is abnormal or we need to change your treatment, we will call you to review the results.   Testing/Procedures:  No testing ordered today  Follow-Up: At [facility], you and your health needs are our priority.  As part of our continuing mission to provide you with exceptional heart care, we have created designated Provider Care Teams.  These Care Teams include your primary Cardiologist (physician) and Advanced Practice Providers (APPs -  Physician Assistants and Nurse Practitioners) who all work together to provide you with the care you need, when you need it.  We recommend signing up for the patient portal called "MyChart".  Sign up information is provided on this After Visit Summary.  MyChart is used to connect with patients for Virtual Visits (Telemedicine).  Patients are able to view lab/test results, encounter notes, upcoming appointments, etc.  Non-urgent messages can be sent to your provider as well.   To learn more about what you can do with MyChart, go to [email or web address].    Your next appointment:   4 week(s)  Provider:   You may see Debbe Odea, MD or one of the following Advanced Practice Providers on your designated Care Team:   Nicolasa Ducking, NP Eula Listen, PA-C Cadence Fransico Michael, PA-C Charlsie Quest, NP   Other Instructions  Please keep a log of  your blood pressures daily and bring with you to your next visit.    Instrucciones de medicacin:  Su mdico recomienda que contine con sus medicamentos actuales segn las indicaciones. Consulte la lista de medicamentos actuales que se le entreg hoy.  *Si necesita un reabastecimiento de sus medicamentos cardacos antes de su prxima cita, llame a su farmacia*   Trabajo de laboratorio:  Su mdico recomienda que se realice anlisis de laboratorio hoy en [employer].  CBC CMET LPIDO  Si le han realizado anlisis de sangre (anlisis de [place]) hoy y sus pruebas son completamente normales, recibir sus resultados nicamente mediante:  Mensaje de MyChart (si tiene MyChart) [place]  Una copia impresa por correo Si tiene alguna prueba de laboratorio que es anormal o necesitamos cambiar su tratamiento, lo llamaremos para revisar los [place].   Pruebas/Procedimientos:  No se han Teacher, English as a foreign language un seguimiento: En [facility], usted y sus necesidades de salud son [place] prioridad. Como parte de nuestra misin continua de brindarle una atencin cardaca excepcional, hemos creado equipos de atencin de proveedores designados. Estos equipos de atencin incluyen a su cardilogo principal (mdico) y proveedores de Cabin crew (APP: asistentes mdicos y enfermeras practicantes), quienes trabajan juntos para brindarle la atencin que necesita, cuando la necesita.  Recomendamos registrarse en el portal para pacientes llamado "MyChart". La informacin de registro se proporciona en este Resumen posterior a la visita. MyChart se utiliza para conectarse con pacientes para visitas virtuales (telemedicina). Los pacientes pueden ver resultados de pruebas/laboratorio, notas  de encuentros, prximas citas, etc. Tambin se pueden enviar mensajes no urgentes a su proveedor. Para obtener ms informacin sobre lo que [employer] con MyChart, visite [email or web address].  Tu  prxima cita: 4 semanas)  Proveedor: Puede consultar a Debbe Odea, MD o uno de los siguientes proveedores de prctica avanzada en su equipo de atencin designado: Nada Boozer NP Eula Listen, [place] Jenene Slicker, PA-C Charlsie Quest, NP  Otras instrucciones  Mantenga un registro diario de su presin arterial y trigalo con usted en su prxima visita.

## 2022-08-30 NOTE — Progress Notes (Signed)
Cholesterol elevated which is expected since he has been off of all of his medications. Repeat Lipid panel in 10-12 weeks. Kidney function is stable. All other results are reassuring. Continue with current medication regimen.

## 2022-09-07 ENCOUNTER — Encounter: Payer: Self-pay | Admitting: *Deleted

## 2022-10-03 ENCOUNTER — Encounter: Payer: Self-pay | Admitting: Cardiology

## 2022-10-03 ENCOUNTER — Ambulatory Visit: Payer: Self-pay | Attending: Cardiology | Admitting: Cardiology

## 2022-10-03 VITALS — BP 156/88 | HR 60 | Ht 63.0 in | Wt 186.6 lb

## 2022-10-03 DIAGNOSIS — E782 Mixed hyperlipidemia: Secondary | ICD-10-CM

## 2022-10-03 DIAGNOSIS — I10 Essential (primary) hypertension: Secondary | ICD-10-CM

## 2022-10-03 DIAGNOSIS — I428 Other cardiomyopathies: Secondary | ICD-10-CM

## 2022-10-03 NOTE — Progress Notes (Signed)
Cardiology Office Note:    Date:  10/03/2022   ID:  Juan Hughes, DOB 06-02-1955, MRN [id number]  PCP:  [facility], [facility]  Cardiologist:  Debbe Odea, MD  Electrophysiologist:  None   Referring MD: [facility]   Chief Complaint  Patient presents with   Follow-up    Blood pressure follow up and brings record of home bp readings.  Patient denies new or acute cardiac problems/concerns today.      History of Present Illness:    Juan Hughes is a 67 y.o. male with a hx of hypertension, NICM, (initial EF 40 to 45%), EF normalized on subsequent echo, hyperlipidemia ,CVA 06/2019 who presents for follow-up.    Seen in the clinic last visit for elevated BP.  Did not take BP meds and cholesterol meds for at least a month at the time.  Blood pressures were running around 200s systolics.  States being compliant with medications, blood pressures have improved ranging from low 100s to 140s.  He denies chest pain, shortness of breath, edema.  His recent cholesterol check was also elevated.  Restarted Lipitor.  Plans to obtain repeat fasting lipid profile next month with PCP.  Prior notes Patient admitted to the hospital on 06/23/2019 with left-sided weakness.  Eventually found to have right lacunar infarct on brain MRI.   echocardiogram showed reduced ejection fraction with EF 40 to 45%.   Pharmacologic myocardial perfusion stress test did not show any ischemia or scar.   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   Coreg, losartan, hydralazine was started.   Cardiac monitor was placed and did not show any evidence of atrial flutter or atrial fibrillation.    Past Medical History:  Diagnosis Date   CHF (congestive heart failure) (HCC)    last ef 40-45% echo 05/2019   GERD (gastroesophageal reflux disease)    HFrEF (heart failure with reduced ejection fraction) (HCC)    Hypertension     Osteoarthritis of right knee    Stroke (HCC) 06/2019   left sided weakness    Past Surgical History:  Procedure Laterality Date   INNER EAR SURGERY     as teenager   TOTAL KNEE ARTHROPLASTY Right 02/24/2020   Procedure: RIGHT TOTAL KNEE ARTHROPLASTY;  Surgeon: Tarry Kos, MD;  Location: [facility];  Service: Orthopedics;  Laterality: Right;   TOTAL KNEE ARTHROPLASTY Left 05/01/2021   Procedure: LEFT TOTAL KNEE ARTHROPLASTY;  Surgeon: Tarry Kos, MD;  [facility];  Service: Orthopedics;  Laterality: Left;    Current Medications: Current Meds  Medication Sig   atorvastatin (LIPITOR) 80 MG tablet Take 1 tablet (80 mg total) by mouth daily at 6 PM.   carvedilol (COREG) 25 MG tablet Take 1 tablet (25 mg total) by mouth 2 (two) times daily.   hydrALAZINE (APRESOLINE) 50 MG tablet Take 1 tablet (50 mg total) by mouth 3 (three) times daily.   losartan (COZAAR) 100 MG tablet Take 1 tablet (100 mg total) by mouth daily.   methocarbamol (ROBAXIN) 500 MG tablet Take 1 tablet (500 mg total) by mouth 2 (two) times daily as needed.   ondansetron (ZOFRAN) 4 MG tablet Take 1 tablet (4 mg total) by mouth every 8 (eight) hours as needed for nausea or vomiting.   rivaroxaban (XARELTO) 10 MG TABS tablet Take 1 tablet (10 mg total) by mouth daily. To be taken after surgery to prevent blood clots   spironolactone (  ALDACTONE) 25 MG tablet Take 1 tablet (25 mg total) by mouth daily.     Allergies:   Patient has no known allergies.   Social History   Socioeconomic History   Marital status: Married    Spouse name: Not on file   Number of children: Not on file   Years of education: Not on file   Highest education level: Not on file  Occupational History   Not on file  Tobacco Use   Smoking status: Former    Types: Cigars    Quit date: 06/22/2019    Years since quitting: 3.2   Smokeless tobacco: Never  Vaping Use   Vaping Use: Never used  Substance and Sexual Activity    Alcohol use: Never   Drug use: Never   Sexual activity: Yes  Other Topics Concern   Not on file  Social History Narrative   Not on file   Social Determinants of Health   Financial Resource Strain: Not on file  Food Insecurity: Not on file  Transportation Needs: Not on file  Physical Activity: Not on file  Stress: Not on file  Social Connections: Not on file     Family History: The patient's family history includes Hypertension in his mother.  ROS:   Please see the history of present illness.     All other systems reviewed and are negative.  EKGs/Labs/Other Studies Reviewed:    The following studies were reviewed today:   EKG:  EKG not ordered today.    Recent Labs: 08/29/2022: ALT 23; BUN 21; Creatinine, Ser 1.41; Hemoglobin 16.8; Platelets 197; Potassium 3.7; Sodium 139  Recent Lipid Panel    Component Value Date/Time   CHOL 274 (H) 08/29/2022 0950   CHOL 125 02/04/2020 0915   TRIG 222 (H) 08/29/2022 0950   HDL 55 08/29/2022 0950   HDL 39 (L) 02/04/2020 0915   CHOLHDL 5.0 08/29/2022 0950   VLDL 44 (H) 08/29/2022 0950   LDLCALC 175 (H) 08/29/2022 0950   LDLCALC 52 02/04/2020 0915    Physical Exam:    VS:  BP (!) 156/88 (BP Location: Left Arm, Patient Position: Sitting, Cuff Size: Normal)   Pulse 60   Ht 5\' 3"  (1.6 m)   Wt 186 lb 9.6 oz (84.6 kg)   SpO2 97%   BMI 33.05 kg/m     Wt Readings from Last 3 Encounters:  10/03/22 186 lb 9.6 oz (84.6 kg)  08/29/22 187 lb 3.2 oz (84.9 kg)  04/27/21 174 lb 1.6 oz (79 kg)     GEN:  Well nourished, well developed in no acute distress HEENT: Normal NECK: No JVD; No carotid bruits CARDIAC: RRR, no murmurs, rubs, gallops RESPIRATORY:  Clear to auscultation without rales, wheezing or rhonchi  ABDOMEN: Soft, non-tender, non-distended MUSCULOSKELETAL:  No edema; No deformity  SKIN: Warm and dry NEUROLOGIC:  Alert and oriented x 3 PSYCHIATRIC:  Normal affect   ASSESSMENT:    1. NICM (nonischemic cardiomyopathy)  (HCC)   2. Primary hypertension   3. Mixed hyperlipidemia      PLAN:    In order of problems listed above:  NICM, likely from uncontrolled hypertension. Initial EF 05/2019, 40 to 45%), last EF normalized 55 to 60%.  Myoview 2021 with no evidence for ischemia.  Medication compliance advised.  Continue Coreg, losartan, Aldactone. Hypertension, BP improved but still elevated.  Continue Coreg 25 twice daily, losartan 100 mg daily, Aldactone 25 mg daily, hydralazine 50 mg 3 times daily.  If BP  stays elevated at follow-up visit, titrate hydralazine. Mixed hyperlipidemia, Lipitor 80.  Recheck lipid panel next month with PCP, fax results to [place].  Follow-up in 3 months.   Medication Adjustments/Labs and Tests Ordered: Current medicines are reviewed at length with the patient today.  Concerns regarding medicines are outlined above.  No orders of the defined types were placed in this encounter.   No orders of the defined types were placed in this encounter.    Patient Instructions  Medication Instructions:   Your physician recommends that you continue on your current medications as directed. Please refer to the Current Medication list given to you today.  *If you need a refill on your cardiac medications before your next appointment, please call your pharmacy*   Lab Work:  None Ordered  If you have labs (blood work) drawn today and your tests are completely normal, you will receive your results only by: MyChart Message (if you have MyChart) OR A paper copy in the mail If you have any lab test that is abnormal or we need to change your treatment, we will call you to review the results.   Testing/Procedures:  None Ordered   Follow-Up: At [facility], you and your health needs are our priority.  As part of our continuing mission to provide you with exceptional heart care, we have created designated Provider Care Teams.  These Care Teams include your primary Cardiologist  (physician) and Advanced Practice Providers (APPs -  Physician Assistants and Nurse Practitioners) who all work together to provide you with the care you need, when you need it.  We recommend signing up for the patient portal called "MyChart".  Sign up information is provided on this After Visit Summary.  MyChart is used to connect with patients for Virtual Visits (Telemedicine).  Patients are able to view lab/test results, encounter notes, upcoming appointments, etc.  Non-urgent messages can be sent to your provider as well.   To learn more about what you can do with MyChart, go to [email or web address].    Your next appointment:   3 month(s)  Provider:   You may see Debbe Odea, MD or one of the following Advanced Practice Providers on your designated Care Team:   Nicolasa Ducking, NP Eula Listen, PA-C Cadence Fransico Michael, PA-C Charlsie Quest, NP   Signed, Debbe Odea, MD  10/03/2022 11:59 AM    [facility]

## 2022-10-03 NOTE — Patient Instructions (Signed)
Medication Instructions:   Your physician recommends that you continue on your current medications as directed. Please refer to the Current Medication list given to you today.  *If you need a refill on your cardiac medications before your next appointment, please call your pharmacy*   Lab Work:  None Ordered  If you have labs (blood work) drawn today and your tests are completely normal, you will receive your results only by: MyChart Message (if you have MyChart) OR A paper copy in the mail If you have any lab test that is abnormal or we need to change your treatment, we will call you to review the results.   Testing/Procedures:  None Ordered   Follow-Up: At [facility], you and your health needs are our priority.  As part of our continuing mission to provide you with exceptional heart care, we have created designated Provider Care Teams.  These Care Teams include your primary Cardiologist (physician) and Advanced Practice Providers (APPs -  Physician Assistants and Nurse Practitioners) who all work together to provide you with the care you need, when you need it.  We recommend signing up for the patient portal called "MyChart".  Sign up information is provided on this After Visit Summary.  MyChart is used to connect with patients for Virtual Visits (Telemedicine).  Patients are able to view lab/test results, encounter notes, upcoming appointments, etc.  Non-urgent messages can be sent to your provider as well.   To learn more about what you can do with MyChart, go to [email or web address].    Your next appointment:   3 month(s)  Provider:   You may see Brian Agbor-Etang, MD or one of the following Advanced Practice Providers on your designated Care Team:   Christopher Berge, NP Ryan Dunn, PA-C Cadence Furth, PA-C Sheri Hammock, NP  

## 2022-10-08 ENCOUNTER — Other Ambulatory Visit: Payer: Self-pay

## 2022-12-05 ENCOUNTER — Encounter: Payer: Self-pay | Admitting: Cardiology

## 2022-12-05 ENCOUNTER — Ambulatory Visit: Payer: Self-pay | Attending: Cardiology | Admitting: Cardiology

## 2022-12-05 VITALS — BP 102/60 | HR 66 | Ht 63.0 in | Wt 184.6 lb

## 2022-12-05 DIAGNOSIS — E782 Mixed hyperlipidemia: Secondary | ICD-10-CM

## 2022-12-05 DIAGNOSIS — I428 Other cardiomyopathies: Secondary | ICD-10-CM

## 2022-12-05 DIAGNOSIS — I10 Essential (primary) hypertension: Secondary | ICD-10-CM

## 2022-12-05 MED ORDER — CARVEDILOL 25 MG PO TABS: 25.0000 mg | ORAL_TABLET | Freq: Two times a day (BID) | ORAL | 3 refills | Status: DC

## 2022-12-05 MED ORDER — SPIRONOLACTONE 25 MG PO TABS: 25.0000 mg | ORAL_TABLET | Freq: Every day | ORAL | 3 refills | Status: DC

## 2022-12-05 MED ORDER — ATORVASTATIN CALCIUM 80 MG PO TABS: 80.0000 mg | ORAL_TABLET | Freq: Every day | ORAL | 3 refills | Status: DC

## 2022-12-05 MED ORDER — LOSARTAN POTASSIUM 100 MG PO TABS: 100.0000 mg | ORAL_TABLET | Freq: Every day | ORAL | 3 refills | Status: DC

## 2022-12-05 MED ORDER — HYDRALAZINE HCL 50 MG PO TABS: 50.0000 mg | ORAL_TABLET | Freq: Three times a day (TID) | ORAL | 3 refills | Status: DC

## 2022-12-05 NOTE — Patient Instructions (Signed)

## 2022-12-05 NOTE — Progress Notes (Signed)
Cardiology Office Note:    Date:  12/05/2022   ID:  Juan Hughes, DOB 26-Oct-1955, MRN [id number]  PCP:  [facility], [facility]  Cardiologist:  Debbe Odea, MD  Electrophysiologist:  None   Referring MD: [facility], [facility]*   Chief Complaint  Patient presents with   Follow-up    Patient denies new or acute cardiac problems/concerns today.  10/2022 lipid panel scanned in media.  Interpreter available during visit.    History of Present Illness:    Juan Hughes is a 67 y.o. male with a hx of hypertension, NICM, (initial EF 40 to 45%), EF normalized 55 to 60%,, hyperlipidemia ,CVA 06/2019 who presents for follow-up.    Overall feels well, denies chest pain or shortness of breath, compliant with medications as prescribed, blood pressure adequately controlled at home.  Tolerating all medications with no adverse effects.  Denies edema.  Prior notes Patient admitted to the hospital on 06/23/2019 with left-sided weakness.  Eventually found to have right lacunar infarct on brain MRI.   echocardiogram showed reduced ejection fraction with EF 40 to 45%.   Pharmacologic myocardial perfusion stress test did not show any ischemia or scar.   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   Coreg, losartan, hydralazine was started.   Cardiac monitor was placed and did not show any evidence of atrial flutter or atrial fibrillation.    Past Medical History:  Diagnosis Date   CHF (congestive heart failure) (HCC)    last ef 40-45% echo 05/2019   GERD (gastroesophageal reflux disease)    HFrEF (heart failure with reduced ejection fraction) (HCC)    Hypertension    Osteoarthritis of right knee    Stroke (HCC) 06/2019   left sided weakness    Past Surgical History:  Procedure Laterality Date   INNER EAR SURGERY     as teenager   TOTAL KNEE ARTHROPLASTY Right 02/24/2020   Procedure: RIGHT TOTAL KNEE  ARTHROPLASTY;  Surgeon: Tarry Kos, MD;  Location: [facility];  Service: Orthopedics;  Laterality: Right;   TOTAL KNEE ARTHROPLASTY Left 05/01/2021   Procedure: LEFT TOTAL KNEE ARTHROPLASTY;  Surgeon: Tarry Kos, MD;  Location: [facility];  Service: Orthopedics;  Laterality: Left;    Current Medications: Current Meds  Medication Sig   [DISCONTINUED] atorvastatin (LIPITOR) 80 MG tablet Take 1 tablet (80 mg total) by mouth daily at 6 PM.   [DISCONTINUED] carvedilol (COREG) 25 MG tablet Take 1 tablet (25 mg total) by mouth 2 (two) times daily.   [DISCONTINUED] hydrALAZINE (APRESOLINE) 50 MG tablet Take 1 tablet (50 mg total) by mouth 3 (three) times daily.   [DISCONTINUED] losartan (COZAAR) 100 MG tablet Take 1 tablet (100 mg total) by mouth daily.   [DISCONTINUED] spironolactone (ALDACTONE) 25 MG tablet Take 1 tablet (25 mg total) by mouth daily.     Allergies:   Patient has no known allergies.   Social History   Socioeconomic History   Marital status: Married    Spouse name: Not on file   Number of children: Not on file   Years of education: Not on file   Highest education level: Not on file  Occupational History   Not on file  Tobacco Use   Smoking status: Former    Types: Cigars    Quit date: 06/22/2019    Years since quitting: 3.4   Smokeless tobacco: Never  Vaping Use   Vaping status: Never Used  Substance and Sexual Activity   Alcohol use: Never   Drug use: Never   Sexual activity: Yes  Other Topics Concern   Not on file  Social History Narrative   Not on file   Social Determinants of Health   Financial Resource Strain: Not on file  Food Insecurity: Not on file  Transportation Needs: Not on file  Physical Activity: Not on file  Stress: Not on file  Social Connections: Not on file     Family History: The patient's family history includes Hypertension in his mother.  ROS:   Please see the history of present illness.     All other systems  reviewed and are negative.  EKGs/Labs/Other Studies Reviewed:    The following studies were reviewed today:   EKG:  EKG not ordered today.    Recent Labs: 08/29/2022: ALT 23; BUN 21; Creatinine, Ser 1.41; Hemoglobin 16.8; Platelets 197; Potassium 3.7; Sodium 139  Recent Lipid Panel    Component Value Date/Time   CHOL 274 (H) 08/29/2022 0950   CHOL 125 02/04/2020 0915   TRIG 222 (H) 08/29/2022 0950   HDL 55 08/29/2022 0950   HDL 39 (L) 02/04/2020 0915   CHOLHDL 5.0 08/29/2022 0950   VLDL 44 (H) 08/29/2022 0950   LDLCALC 175 (H) 08/29/2022 0950   LDLCALC 52 02/04/2020 0915   Outside lipid panel 6/24 total cholesterol 134, LDL 73,  Physical Exam:    VS:  BP 102/60 (BP Location: Left Arm, Patient Position: Sitting, Cuff Size: Normal)   Pulse 66   Ht 5\' 3"  (1.6 m)   Wt 184 lb 9.6 oz (83.7 kg)   SpO2 97%   BMI 32.70 kg/m     Wt Readings from Last 3 Encounters:  12/05/22 184 lb 9.6 oz (83.7 kg)  10/03/22 186 lb 9.6 oz (84.6 kg)  08/29/22 187 lb 3.2 oz (84.9 kg)     GEN:  Well nourished, well developed in no acute distress HEENT: Normal NECK: No JVD; No carotid bruits CARDIAC: RRR, no murmurs, rubs, gallops RESPIRATORY:  Clear to auscultation without rales, wheezing or rhonchi  ABDOMEN: Soft, non-tender, non-distended MUSCULOSKELETAL:  No edema; No deformity  SKIN: Warm and dry NEUROLOGIC:  Alert and oriented x 3 PSYCHIATRIC:  Normal affect   ASSESSMENT:    1. NICM (nonischemic cardiomyopathy) (HCC)   2. Primary hypertension   3. Mixed hyperlipidemia    PLAN:    In order of problems listed above:  NICM, likely from uncontrolled hypertension. Initial EF 01/21, 40 to 45%), last EF 10/22 normalized 55 to 60%.  Myoview 2021 with no ischemia.   Continue Coreg, losartan, Aldactone. Hypertension, BP controlled.  Continue Coreg 25 twice daily, losartan 100 mg daily, Aldactone 25 mg daily, hydralazine 50 mg 3 times daily.  Low-salt diet emphasized.  If BP starts trending  low, titrate off BP meds beginning with hydralazine. Mixed hyperlipidemia, cholesterol controlled, continue Lipitor 80 mg daily.  Follow-up in 3 months.   Medication Adjustments/Labs and Tests Ordered: Current medicines are reviewed at length with the patient today.  Concerns regarding medicines are outlined above.  No orders of the defined types were placed in this encounter.   Meds ordered this encounter  Medications   atorvastatin (LIPITOR) 80 MG tablet    Sig: Take 1 tablet (80 mg total) by mouth daily at 6 PM.    Dispense:  90 tablet    Refill:  3   carvedilol (COREG) 25 MG tablet    Sig: Take  1 tablet (25 mg total) by mouth 2 (two) times daily.    Dispense:  180 tablet    Refill:  3   hydrALAZINE (APRESOLINE) 50 MG tablet    Sig: Take 1 tablet (50 mg total) by mouth 3 (three) times daily.    Dispense:  270 tablet    Refill:  3   spironolactone (ALDACTONE) 25 MG tablet    Sig: Take 1 tablet (25 mg total) by mouth daily.    Dispense:  90 tablet    Refill:  3   losartan (COZAAR) 100 MG tablet    Sig: Take 1 tablet (100 mg total) by mouth daily.    Dispense:  90 tablet    Refill:  3     Patient Instructions  Medication Instructions:   Your physician recommends that you continue on your current medications as directed. Please refer to the Current Medication list given to you today.  *If you need a refill on your cardiac medications before your next appointment, please call your pharmacy*   Lab Work:  None Ordered  If you have labs (blood work) drawn today and your tests are completely normal, you will receive your results only by: MyChart Message (if you have MyChart) OR A paper copy in the mail If you have any lab test that is abnormal or we need to change your treatment, we will call you to review the results.   Testing/Procedures:  None Ordered    Follow-Up: At [facility], you and your health needs are our priority.  As part of our  continuing mission to provide you with exceptional heart care, we have created designated Provider Care Teams.  These Care Teams include your primary Cardiologist (physician) and Advanced Practice Providers (APPs -  Physician Assistants and Nurse Practitioners) who all work together to provide you with the care you need, when you need it.  We recommend signing up for the patient portal called "MyChart".  Sign up information is provided on this After Visit Summary.  MyChart is used to connect with patients for Virtual Visits (Telemedicine).  Patients are able to view lab/test results, encounter notes, upcoming appointments, etc.  Non-urgent messages can be sent to your provider as well.   To learn more about what you can do with MyChart, go to [email or web address].    Your next appointment:   6 month(s)  Provider:   You may see Debbe Odea, MD or one of the following Advanced Practice Providers on your designated Care Team:   Nicolasa Ducking, NP Eula Listen, PA-C Cadence Fransico Michael, PA-C Charlsie Quest, NP   Signed, Debbe Odea, MD  12/05/2022 11:24 AM    [facility]

## 2023-04-29 NOTE — Progress Notes (Unsigned)
Office Visit Note   Patient: Juan Hughes           Date of Birth: 01/31/1956           MRN: [id number] Visit Date: 04/30/2023              Requested by: Gorden Harms, PA-C [address] [place],  [place] [address] PCP: [facility], [facility]   Assessment & Plan: Visit Diagnoses:  1. Status post total left knee replacement     Plan: Patient is now 2 years status post left total knee replacement.  He is doing well has no real complaints.  Follow-up as needed.    Follow-Up Instructions: Return if symptoms worsen or fail to improve.   Orders:  Orders Placed This Encounter  Procedures   XR Knee 1-2 Views Left   No orders of the defined types were placed in this encounter.     Procedures: No procedures performed   Clinical Data: No additional findings.   Subjective: Chief Complaint  Patient presents with   Left Knee - Follow-up    Left total knee arthroplasty 05/01/2021    HPI Patient is 2 years status post left total knee replacement.  He is doing well has no complaints. Review of Systems   Objective: Vital Signs: There were no vitals taken for this visit.  Physical Exam  Ortho Exam Exam of the left knee shows fully healed surgical scar.  Excellent range of motion.  Collaterals are stable. Specialty Comments:  No specialty comments available.  Imaging: XR Knee 1-2 Views Left  Result Date: 04/30/2023 X-rays of the left knee show a stable left total knee replacement in good alignment.     PMFS History: Patient Active Problem List   Diagnosis Date Noted   Status post total left knee replacement 05/01/2021   Hx of total knee replacement, right 02/16/2021   Primary osteoarthritis of left knee 02/16/2021   Status post total knee replacement, right 02/24/2020   Osteoarthritis of right knee 02/23/2020   Nonischemic cardiomyopathy (HCC)    HFrEF (heart failure with reduced ejection fraction) (HCC)    Stroke (HCC) 06/18/2019    Hypertension    Left-sided weakness    Hypokalemia    Tobacco abuse    Past Medical History:  Diagnosis Date   CHF (congestive heart failure) (HCC)    last ef 40-45% echo 05/2019   GERD (gastroesophageal reflux disease)    HFrEF (heart failure with reduced ejection fraction) (HCC)    Hypertension    Osteoarthritis of right knee    Stroke (HCC) 06/2019   left sided weakness    Family History  Problem Relation Age of Onset   Hypertension Mother     Past Surgical History:  Procedure Laterality Date   INNER EAR SURGERY     as teenager   TOTAL KNEE ARTHROPLASTY Right 02/24/2020   Procedure: RIGHT TOTAL KNEE ARTHROPLASTY;  Surgeon: Tarry Kos, MD;  Location: [facility];  Service: Orthopedics;  Laterality: Right;   TOTAL KNEE ARTHROPLASTY Left 05/01/2021   Procedure: LEFT TOTAL KNEE ARTHROPLASTY;  Surgeon: Tarry Kos, MD;  Location: [facility];  Service: Orthopedics;  Laterality: Left;   Social History   Occupational History   Not on file  Tobacco Use   Smoking status: Former    Types: Cigars    Quit date: 06/22/2019    Years since quitting: 3.8   Smokeless tobacco: Never  Vaping Use   Vaping status:  Never Used  Substance and Sexual Activity   Alcohol use: Never   Drug use: Never   Sexual activity: Yes

## 2023-04-30 ENCOUNTER — Other Ambulatory Visit (INDEPENDENT_AMBULATORY_CARE_PROVIDER_SITE_OTHER): Payer: Self-pay

## 2023-04-30 ENCOUNTER — Ambulatory Visit (INDEPENDENT_AMBULATORY_CARE_PROVIDER_SITE_OTHER): Payer: Self-pay | Admitting: Orthopaedic Surgery

## 2023-04-30 ENCOUNTER — Encounter: Payer: Self-pay | Admitting: Orthopaedic Surgery

## 2023-04-30 DIAGNOSIS — Z96652 Presence of left artificial knee joint: Secondary | ICD-10-CM

## 2023-08-21 ENCOUNTER — Encounter: Payer: Self-pay | Admitting: Cardiology

## 2023-08-21 ENCOUNTER — Ambulatory Visit: Payer: Self-pay | Attending: Cardiology | Admitting: Cardiology

## 2023-08-21 VITALS — BP 118/62 | HR 74 | Ht 63.0 in | Wt 189.0 lb

## 2023-08-21 DIAGNOSIS — I10 Essential (primary) hypertension: Secondary | ICD-10-CM

## 2023-08-21 DIAGNOSIS — E782 Mixed hyperlipidemia: Secondary | ICD-10-CM

## 2023-08-21 DIAGNOSIS — I428 Other cardiomyopathies: Secondary | ICD-10-CM

## 2023-08-21 DIAGNOSIS — R0981 Nasal congestion: Secondary | ICD-10-CM

## 2023-08-21 NOTE — Patient Instructions (Signed)
 Medication Instructions:  Please take over the counter Zyrtec and Flonase for allergies *If you need a refill on your cardiac medications before your next appointment, please call your pharmacy*  Lab Work: None ordered.  If you have labs (blood work) drawn today and your tests are completely normal, you will receive your results only by: MyChart Message (if you have MyChart) OR A paper copy in the mail If you have any lab test that is abnormal or we need to change your treatment, we will call you to review the results.  Testing/Procedures:  Your physician has requested that you have an echocardiogram in 6 months. Echocardiography is a painless test that uses sound waves to create images of your heart. It provides your doctor with information about the size and shape of your heart and how well your heart's chambers and valves are working.   You may receive an ultrasound enhancing agent through an IV if needed to better visualize your heart during the echo. This procedure takes approximately one hour.  There are no restrictions for this procedure.  This will take place at [address] [facility] [address], [place] [address]  Please note: We ask at that you not bring children with you during ultrasound (echo/ vascular) testing. Due to room size and safety concerns, children are not allowed in the ultrasound rooms during exams. Our front office staff cannot provide observation of children in our lobby area while testing is being conducted. An adult accompanying a patient to their appointment will only be allowed in the ultrasound room at the discretion of the ultrasound technician under special circumstances. We apologize for any inconvenience.   Follow-Up: At [facility], you and your health needs are our priority.  As part of our continuing mission to provide you with exceptional heart care, our providers are all part of one team.  This team includes your primary  Cardiologist (physician) and Advanced Practice Providers or APPs (Physician Assistants and Nurse Practitioners) who all work together to provide you with the care you need, when you need it.  Your next appointment:   12 month(s)  Provider:   You may see Debbe Odea, MD or one of the following Advanced Practice Providers on your designated Care Team:   Nicolasa Ducking, NP Ames Dura, PA-C Eula Listen, PA-C Cadence Boardman, PA-C Charlsie Quest, NP Carlos Levering, NP    We recommend signing up for the patient portal called "MyChart".  Sign up information is provided on this After Visit Summary.  MyChart is used to connect with patients for Virtual Visits (Telemedicine).  Patients are able to view lab/test results, encounter notes, upcoming appointments, etc.  Non-urgent messages can be sent to your provider as well.   To learn more about what you can do with MyChart, go to [email or web address].

## 2023-08-21 NOTE — Progress Notes (Signed)
 Cardiology Office Note:    Date:  08/21/2023   ID:  Arth, Nicastro 1955/07/25, MRN [id number]  PCP:  [facility]  Cardiologist:  Debbe Odea, MD  Electrophysiologist:  None   Referring MD: [facility]   No chief complaint on file.   History of Present Illness:    Juan Hughes is a 68 y.o. male with a hx of hypertension, NICM, (initial EF 40 to 45%), EF normalized 55 to 60%,, hyperlipidemia ,CVA 06/2019 who presents for follow-up.    Denies chest pain or shortness of breath.  Has nasal congestion and runny nose.  Compliant with medications as prescribed.  Feels well from a cardiac perspective.  BP is adequately controlled.  Prior notes Echo 10/22 EF 55 to 60% Patient admitted to the hospital on 06/23/2019 with left-sided weakness.  Eventually found to have right lacunar infarct on brain MRI.   echocardiogram showed reduced ejection fraction with EF 40 to 45%.   Pharmacologic myocardial perfusion stress test did not show any ischemia or scar.   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   Coreg, losartan, hydralazine was started.   Cardiac monitor was placed and did not show any evidence of atrial flutter or atrial fibrillation.    Past Medical History:  Diagnosis Date   CHF (congestive heart failure) (HCC)    last ef 40-45% echo 05/2019   GERD (gastroesophageal reflux disease)    HFrEF (heart failure with reduced ejection fraction) (HCC)    Hypertension    Osteoarthritis of right knee    Stroke (HCC) 06/2019   left sided weakness    Past Surgical History:  Procedure Laterality Date   INNER EAR SURGERY     as teenager   TOTAL KNEE ARTHROPLASTY Right 02/24/2020   Procedure: RIGHT TOTAL KNEE ARTHROPLASTY;  Surgeon: Tarry Kos, MD;  Location: [facility];  Service: Orthopedics;  Laterality: Right;   TOTAL KNEE ARTHROPLASTY Left 05/01/2021    Procedure: LEFT TOTAL KNEE ARTHROPLASTY;  Surgeon: Tarry Kos, MD;  Location: [facility] OR;  Service: Orthopedics;  Laterality: Left;    Current Medications: No outpatient medications have been marked as taking for the 08/21/23 encounter (Office Visit) with Debbe Odea, MD.     Allergies:   Patient has no known allergies.   Social History   Socioeconomic History   Marital status: Married    Spouse name: Not on file   Number of children: Not on file   Years of education: Not on file   Highest education level: Not on file  Occupational History   Not on file  Tobacco Use   Smoking status: Former    Types: Cigars    Quit date: 06/22/2019    Years since quitting: 4.1   Smokeless tobacco: Never  Vaping Use   Vaping status: Never Used  Substance and Sexual Activity   Alcohol use: Never   Drug use: Never   Sexual activity: Yes  Other Topics Concern   Not on file  Social History Narrative   Not on file   Social Drivers of Health   Financial Resource Strain: Not on file  Food Insecurity: Not on file  Transportation Needs: Not on file  Physical Activity: Not on file  Stress: Not on file  Social Connections: Not on file     Family History: The patient's family history includes Hypertension in his mother.  ROS:   Please see the history  of present illness.     All other systems reviewed and are negative.  EKGs/Labs/Other Studies Reviewed:    The following studies were reviewed today:   EKG:  EKG not ordered today.    Recent Labs: 08/29/2022: ALT 23; BUN 21; Creatinine, Ser 1.41; Hemoglobin 16.8; Platelets 197; Potassium 3.7; Sodium 139  Recent Lipid Panel    Component Value Date/Time   CHOL 274 (H) 08/29/2022 0950   CHOL 125 02/04/2020 0915   TRIG 222 (H) 08/29/2022 0950   HDL 55 08/29/2022 0950   HDL 39 (L) 02/04/2020 0915   CHOLHDL 5.0 08/29/2022 0950   VLDL 44 (H) 08/29/2022 0950   LDLCALC 175 (H) 08/29/2022 0950   LDLCALC 52 02/04/2020 0915   Outside  lipid panel 6/24 total cholesterol 134, LDL 73,  Physical Exam:    VS:  BP 118/62 (BP Location: Left Arm, Patient Position: Sitting)   Pulse 74   Ht 5\' 3"  (1.6 m)   Wt 189 lb (85.7 kg)   SpO2 96%   BMI 33.48 kg/m     Wt Readings from Last 3 Encounters:  08/21/23 189 lb (85.7 kg)  12/05/22 184 lb 9.6 oz (83.7 kg)  10/03/22 186 lb 9.6 oz (84.6 kg)     GEN:  Well nourished, well developed in no acute distress HEENT: Normal NECK: No JVD; No carotid bruits CARDIAC: RRR, no murmurs, rubs, gallops RESPIRATORY:  Clear to auscultation without rales, wheezing or rhonchi  ABDOMEN: Soft, non-tender, non-distended MUSCULOSKELETAL:  No edema; No deformity  SKIN: Warm and dry NEUROLOGIC:  Alert and oriented x 3 PSYCHIATRIC:  Normal affect   ASSESSMENT:    1. NICM (nonischemic cardiomyopathy) (HCC)   2. Primary hypertension   3. Mixed hyperlipidemia   4. Nasal congestion    PLAN:    In order of problems listed above:  NICM (initial EF 40-45%), last EF 10/22 EF -55 to 60%.  Etiology possibly uncontrolled hypertension.  Myoview showed no ischemia.   Continue Coreg 25 mg twice daily, losartan 100 mg daily, Aldactone 25 mg daily.  Obtain echo in 6 months. Hypertension, BP controlled.  Continue Coreg 25 twice daily, losartan 100 mg daily, Aldactone 25 mg daily, hydralazine 50 mg 3 times daily.  Low-salt diet emphasized.  Mixed hyperlipidemia, cholesterol controlled, continue Lipitor 80 mg daily. Nasal congestion, okay to try OTC Zyrtec, Flonase.  Follow-up in 12 months.   Medication Adjustments/Labs and Tests Ordered: Current medicines are reviewed at length with the patient today.  Concerns regarding medicines are outlined above.  Orders Placed This Encounter  Procedures   EKG 12-Lead   ECHOCARDIOGRAM COMPLETE    No orders of the defined types were placed in this encounter.    Patient Instructions  Medication Instructions:  Please take over the counter Zyrtec and Flonase for  allergies *If you need a refill on your cardiac medications before your next appointment, please call your pharmacy*  Lab Work: None ordered.  If you have labs (blood work) drawn today and your tests are completely normal, you will receive your results only by: MyChart Message (if you have MyChart) OR A paper copy in the mail If you have any lab test that is abnormal or we need to change your treatment, we will call you to review the results.  Testing/Procedures:  Your physician has requested that you have an echocardiogram in 6 months. Echocardiography is a painless test that uses sound waves to create images of your heart. It provides your doctor with  information about the size and shape of your heart and how well your heart's chambers and valves are working.   You may receive an ultrasound enhancing agent through an IV if needed to better visualize your heart during the echo. This procedure takes approximately one hour.  There are no restrictions for this procedure.  This will take place at [address] [facility] [address]) [address], [place] [address]  Please note: We ask at that you not bring children with you during ultrasound (echo/ vascular) testing. Due to room size and safety concerns, children are not allowed in the ultrasound rooms during exams. Our front office staff cannot provide observation of children in our lobby area while testing is being conducted. An adult accompanying a patient to their appointment will only be allowed in the ultrasound room at the discretion of the ultrasound technician under special circumstances. We apologize for any inconvenience.   Follow-Up: At [facility], you and your health needs are our priority.  As part of our continuing mission to provide you with exceptional heart care, our providers are all part of one team.  This team includes your primary Cardiologist (physician) and Advanced Practice Providers or APPs (Physician Assistants  and Nurse Practitioners) who all work together to provide you with the care you need, when you need it.  Your next appointment:   12 month(s)  Provider:   You may see Debbe Odea, MD or one of the following Advanced Practice Providers on your designated Care Team:   Nicolasa Ducking, NP Ames Dura, PA-C Eula Listen, PA-C Cadence Strum, PA-C Charlsie Quest, NP Carlos Levering, NP    We recommend signing up for the patient portal called "MyChart".  Sign up information is provided on this After Visit Summary.  MyChart is used to connect with patients for Virtual Visits (Telemedicine).  Patients are able to view lab/test results, encounter notes, upcoming appointments, etc.  Non-urgent messages can be sent to your provider as well.   To learn more about what you can do with MyChart, go to [email or web address].            Signed, Debbe Odea, MD  08/21/2023 11:43 AM    [facility]

## 2024-02-20 ENCOUNTER — Ambulatory Visit: Payer: Self-pay | Attending: Cardiology

## 2024-02-20 ENCOUNTER — Ambulatory Visit: Payer: Self-pay | Admitting: Cardiology

## 2024-02-20 DIAGNOSIS — I428 Other cardiomyopathies: Secondary | ICD-10-CM

## 2024-02-20 LAB — ECHOCARDIOGRAM COMPLETE
AV Mean grad: 4 mmHg
AV Peak grad: 7.4 mmHg
Ao pk vel: 1.36 m/s
Area-P 1/2: 2.66 cm2
S' Lateral: 3.2 cm

## 2024-03-18 ENCOUNTER — Telehealth: Payer: Self-pay | Admitting: Cardiology

## 2024-03-18 MED ORDER — CARVEDILOL 25 MG PO TABS: 25.0000 mg | ORAL_TABLET | Freq: Two times a day (BID) | ORAL | 1 refills | Status: AC

## 2024-03-18 MED ORDER — HYDRALAZINE HCL 50 MG PO TABS: 50.0000 mg | ORAL_TABLET | Freq: Three times a day (TID) | ORAL | 1 refills | Status: AC

## 2024-03-18 MED ORDER — LOSARTAN POTASSIUM 100 MG PO TABS: 100.0000 mg | ORAL_TABLET | Freq: Every day | ORAL | 1 refills | Status: AC

## 2024-03-18 MED ORDER — ATORVASTATIN CALCIUM 80 MG PO TABS: 80.0000 mg | ORAL_TABLET | Freq: Every day | ORAL | 1 refills | Status: AC

## 2024-03-18 NOTE — Telephone Encounter (Signed)
 Pt's medications were sent to pt's pharmacy as requested. Confirmation received.

## 2024-03-18 NOTE — Telephone Encounter (Signed)
*  STAT* If patient is at the pharmacy, call can be transferred to refill team.   1. Which medications need to be refilled? (please list name of each medication and dose if known) atorvastatin  (LIPITOR) 80 MG tablet  carvedilol  (COREG ) 25 MG table hydrALAZINE  (APRESOLINE ) 50 MG tablet losartan  (COZAAR ) 100 MG tablet   2. Which pharmacy/location (including street and city if local pharmacy) is medication to be sent to? [facility] - [place], [place] - [address]    3. Do they need a 30 day or 90 day supply?  90 day supply

## 2024-03-23 ENCOUNTER — Encounter: Payer: Self-pay | Admitting: Radiology

## 2024-04-21 ENCOUNTER — Other Ambulatory Visit: Payer: Self-pay | Admitting: Cardiology

## 2024-04-23 ENCOUNTER — Telehealth: Payer: Self-pay | Admitting: Cardiology

## 2024-04-23 MED ORDER — SPIRONOLACTONE 25 MG PO TABS: 25.0000 mg | ORAL_TABLET | Freq: Every day | ORAL | 1 refills | Status: AC

## 2024-04-23 NOTE — Telephone Encounter (Signed)
 Refill sent

## 2024-04-23 NOTE — Telephone Encounter (Signed)
*  STAT* If patient is at the pharmacy, call can be transferred to refill team.   1. Which medications need to be refilled? (please list name of each medication and dose if known) spironolactone  (ALDACTONE ) 25 MG tablet   2. Would you like to learn more about the convenience, safety, & potential cost savings by using the [facility]? no   3. Are you open to using the [facility] no   4. Which pharmacy/location (including street and city if local pharmacy) is medication to be sent to?  [facility] - [place], [place] - [address]     5. Do they need a 30 day or 90 day supply? 90
